# Patient Record
Sex: Male | Born: 1935 | Race: White | Hispanic: No | Marital: Married | Smoking: Never smoker
Health system: Southern US, Community
[De-identification: ages and names within clinical notes are randomized; demographics above are authoritative.]

## PROBLEM LIST (undated history)

## (undated) DIAGNOSIS — I272 Pulmonary hypertension, unspecified: Secondary | ICD-10-CM

## (undated) DIAGNOSIS — Z7901 Long term (current) use of anticoagulants: Secondary | ICD-10-CM

## (undated) DIAGNOSIS — Z950 Presence of cardiac pacemaker: Secondary | ICD-10-CM

## (undated) DIAGNOSIS — E785 Hyperlipidemia, unspecified: Secondary | ICD-10-CM

## (undated) DIAGNOSIS — I499 Cardiac arrhythmia, unspecified: Secondary | ICD-10-CM

## (undated) DIAGNOSIS — I340 Nonrheumatic mitral (valve) insufficiency: Secondary | ICD-10-CM

## (undated) DIAGNOSIS — I2510 Atherosclerotic heart disease of native coronary artery without angina pectoris: Secondary | ICD-10-CM

## (undated) DIAGNOSIS — I4891 Unspecified atrial fibrillation: Secondary | ICD-10-CM

## (undated) DIAGNOSIS — C61 Malignant neoplasm of prostate: Secondary | ICD-10-CM

## (undated) DIAGNOSIS — I10 Essential (primary) hypertension: Secondary | ICD-10-CM

## (undated) DIAGNOSIS — I509 Heart failure, unspecified: Secondary | ICD-10-CM

## (undated) HISTORY — PX: CHOLECYSTECTOMY: SHX55

## (undated) HISTORY — DX: Pulmonary hypertension, unspecified: I27.20

## (undated) HISTORY — DX: Long term (current) use of anticoagulants: Z79.01

## (undated) HISTORY — DX: Presence of cardiac pacemaker: Z95.0

---

## 1993-09-16 HISTORY — PX: CORONARY ANGIOPLASTY: SHX604

## 1999-05-03 ENCOUNTER — Emergency Department (HOSPITAL_COMMUNITY): Admission: EM | Admit: 1999-05-03 | Discharge: 1999-05-03 | Payer: Self-pay | Admitting: Emergency Medicine

## 1999-05-03 ENCOUNTER — Encounter: Payer: Self-pay | Admitting: Emergency Medicine

## 1999-05-09 ENCOUNTER — Encounter: Admission: RE | Admit: 1999-05-09 | Discharge: 1999-05-09 | Payer: Self-pay | Admitting: Urology

## 1999-05-09 ENCOUNTER — Encounter: Payer: Self-pay | Admitting: Urology

## 1999-05-24 ENCOUNTER — Inpatient Hospital Stay (HOSPITAL_COMMUNITY): Admission: AD | Admit: 1999-05-24 | Discharge: 1999-05-25 | Payer: Self-pay | Admitting: Cardiovascular Disease

## 1999-05-24 ENCOUNTER — Encounter: Payer: Self-pay | Admitting: Cardiovascular Disease

## 1999-05-24 ENCOUNTER — Encounter: Payer: Self-pay | Admitting: Urology

## 1999-05-25 ENCOUNTER — Encounter: Payer: Self-pay | Admitting: Urology

## 1999-05-31 ENCOUNTER — Encounter: Payer: Self-pay | Admitting: Urology

## 1999-05-31 ENCOUNTER — Ambulatory Visit (HOSPITAL_COMMUNITY): Admission: RE | Admit: 1999-05-31 | Discharge: 1999-05-31 | Payer: Self-pay | Admitting: Urology

## 2001-12-16 ENCOUNTER — Encounter: Payer: Self-pay | Admitting: Internal Medicine

## 2001-12-16 ENCOUNTER — Encounter: Admission: RE | Admit: 2001-12-16 | Discharge: 2001-12-16 | Payer: Self-pay | Admitting: Internal Medicine

## 2002-05-02 ENCOUNTER — Emergency Department (HOSPITAL_COMMUNITY): Admission: EM | Admit: 2002-05-02 | Discharge: 2002-05-02 | Payer: Self-pay | Admitting: Emergency Medicine

## 2002-05-10 ENCOUNTER — Encounter: Payer: Self-pay | Admitting: *Deleted

## 2002-05-10 ENCOUNTER — Encounter: Payer: Self-pay | Admitting: Cardiovascular Disease

## 2002-05-10 ENCOUNTER — Inpatient Hospital Stay (HOSPITAL_COMMUNITY): Admission: EM | Admit: 2002-05-10 | Discharge: 2002-05-12 | Payer: Self-pay | Admitting: Emergency Medicine

## 2002-05-11 HISTORY — PX: CARDIAC CATHETERIZATION: SHX172

## 2005-11-15 ENCOUNTER — Ambulatory Visit (HOSPITAL_BASED_OUTPATIENT_CLINIC_OR_DEPARTMENT_OTHER): Admission: RE | Admit: 2005-11-15 | Discharge: 2005-11-15 | Payer: Self-pay | Admitting: Surgery

## 2005-11-15 ENCOUNTER — Encounter (INDEPENDENT_AMBULATORY_CARE_PROVIDER_SITE_OTHER): Payer: Self-pay | Admitting: *Deleted

## 2006-10-07 ENCOUNTER — Encounter: Admission: RE | Admit: 2006-10-07 | Discharge: 2006-10-07 | Payer: Self-pay | Admitting: Cardiovascular Disease

## 2006-10-14 ENCOUNTER — Inpatient Hospital Stay (HOSPITAL_COMMUNITY): Admission: RE | Admit: 2006-10-14 | Discharge: 2006-10-17 | Payer: Self-pay | Admitting: Cardiovascular Disease

## 2006-10-14 HISTORY — PX: CARDIAC CATHETERIZATION: SHX172

## 2006-10-16 HISTORY — PX: PERMANENT PACEMAKER INSERTION: SHX6023

## 2009-05-24 ENCOUNTER — Ambulatory Visit: Payer: Self-pay | Admitting: Ophthalmology

## 2009-06-21 ENCOUNTER — Ambulatory Visit: Payer: Self-pay

## 2009-07-18 ENCOUNTER — Ambulatory Visit: Admission: RE | Admit: 2009-07-18 | Discharge: 2009-07-21 | Payer: Self-pay | Admitting: Radiation Oncology

## 2009-08-30 HISTORY — PX: NM MYOVIEW LTD: HXRAD82

## 2009-09-08 ENCOUNTER — Ambulatory Visit (HOSPITAL_BASED_OUTPATIENT_CLINIC_OR_DEPARTMENT_OTHER): Admission: RE | Admit: 2009-09-08 | Discharge: 2009-09-08 | Payer: Self-pay | Admitting: Urology

## 2010-07-08 ENCOUNTER — Encounter: Payer: Self-pay | Admitting: Urology

## 2010-08-10 ENCOUNTER — Ambulatory Visit (HOSPITAL_COMMUNITY)
Admission: RE | Admit: 2010-08-10 | Discharge: 2010-08-10 | Disposition: A | Discharge status: Home / Self Care | Payer: Medicare Other | Source: Ambulatory Visit | Attending: Cardiovascular Disease | Admitting: Cardiovascular Disease

## 2010-08-10 DIAGNOSIS — I059 Rheumatic mitral valve disease, unspecified: Secondary | ICD-10-CM | POA: Insufficient documentation

## 2010-08-13 ENCOUNTER — Ambulatory Visit (HOSPITAL_COMMUNITY): Admission: RE | Admit: 2010-08-13 | Payer: Medicare Other | Source: Ambulatory Visit | Admitting: Cardiovascular Disease

## 2010-09-10 LAB — POCT I-STAT, CHEM 8
Chloride: 107 mEq/L (ref 96–112)
Creatinine, Ser: 0.9 mg/dL (ref 0.4–1.5)
HCT: 42 % (ref 39.0–52.0)
Hemoglobin: 14.3 g/dL (ref 13.0–17.0)
Potassium: 4.5 mEq/L (ref 3.5–5.1)
Sodium: 142 mEq/L (ref 135–145)

## 2010-11-02 NOTE — Discharge Summary (Signed)
[name], [name] NO.:  [id number]   MEDICAL RECORD NO.:  [id number]          PATIENT TYPE:  INP   LOCATION:  2031                         FACILITY:  [facility]   PHYSICIAN:  Nicki Guadalajara, M.D.     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 Nicki Guadalajara who has known coronary artery disease.  He had a left  circumflex MI in 1995.  He underwent intervention.  Last cath was in  2003.  He was treated medically.  He was recently seen by Dr. Tresa Endo.  He  had atrial fibrillation with sick sinus syndrome.  He thought he needed  to undergo cardiac catheterization and then it was recommended that he  have permanent pacemaker.  Thus, he came in as an outpatient on October 14, 2006 and had a cardiac catheterization.  He had coronary artery  disease.  Please see Dr. Landry Dyke note for complete details.  His RCA  disease was slightly progressed.  The mid stenosis is now 90% and  eccentric.  Dr. Tresa Endo did not feel that this was the main reason for his  atrial fibrillation and his sick sinus syndrome.  He went on to have a  permanent pacemaker placed on Oct 16, 2006 by Dr. Lenise Herald.  He  had a Medtronic EnRhythm #[id number] placed.  The following day it was  interrogated and had normal device function.  He was seen by Dr. Jenne Campus  and Dr. Tresa Endo and he was considered stable for discharge home.  His  chest x-ray showed no pneumothorax.   LABORATORY DATA:  Hemoglobin 13.7, hematocrit 39.1, WBC 7.3, platelets  202,000.  Sodium 137, potassium 4, BUN 13, creatinine 0.92.  Chest x-ray  showed no acute findings.  He had no pneumothorax post pacemaker  placement.   DISCHARGE MEDICATIONS:  1. Toprol XL 50 mg a day.  2. Aspirin 81 mg a day.  3. Vytorin 10/20 at bedtime.  4. Ramipril 10 mg a day.  5. Norvasc 10  mg a day.  6. Coumadin.  He should start the day after discharge at the same dose      previous to hospitalization.  However, for 2 days, he is just to      take 2.5.   DISCHARGE INSTRUCTIONS:  He is to follow up with Dr. Tresa Endo on Oct 24, 2006.  He should keep his wound dry for 5 days and he may wash with soap  and water.  He was given written pacemaker activity instructions.   DISCHARGE DIAGNOSES:  1. Paroxysmal atrial fibrillation with sick sinus syndrome.  2. Known coronary artery disease with slightly progressed right      coronary artery disease of 90% with a history of myocardial      infarction and intervention of his circumflex in 1995.  He also has      mild residual other disease.  3. Hypertension.  4. Hyperlipidemia.  5. Anticoagulation  for paroxysmal atrial fibrillation.      Lezlie Octave, N.P.    ______________________________  Nicki Guadalajara, M.D.    BB/MEDQ  D:  11/17/2006  T:  11/17/2006  Job:  [id number]

## 2010-11-02 NOTE — Discharge Summary (Signed)
NAME:  Norman Lee, Norman Lee NO.:  [id number]   MEDICAL RECORD NO.:  [id number]                   PATIENT TYPE:  INP   LOCATION:  2922                                 FACILITY:  [facility]   PHYSICIAN:  Nicki Guadalajara, M.D.                  DATE OF BIRTH:  07-16-1928   DATE OF ADMISSION:  05/10/2002  DATE OF DISCHARGE:  05/12/2002                                 DISCHARGE SUMMARY   DISCHARGE DIAGNOSES:  1. Vertigo of undetermined etiology, possibly secondary to inner ear versus     posterior circulation embolic event.  2. Sinusitis.  3. Paroxysmal atrial fibrillation this admission.  4. History of coronary disease, status post circumflex intervention in 4/95,     with redo intervention in 7/95, catheterization this admission revealing     no re-stenosis.  5. Hypertension, improved at discharge.  6. Treated hyperlipidemia.  7. Chronic back problems.   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  He was seen recently by Dr. Tresa Endo in  the office to be established with a cardiologist.  He had a previous  myocardial infarction treated with circumflex intervention in 4/95, with  redo intervention to the circumflex in 7/95, Dr. Tresa Endo performed those  interventions.  His cardiologist had previously been Dr. Daleen Squibb.  He had some  dizzy spells as an outpatient.  Dr. Tresa Endo had set him up for a Cardiolite  study and an echocardiogram and made some adjustments to his medications,  increasing his Altace to 10 mg q.d. and his Toprol to 50 mg.  On the morning  of admission, he presented to the emergency room with significant dizziness  and nausea.  He previously had some back pain which was similar to his pre-  MI symptoms.  In the emergency room he had transient paroxysmal atrial  fibrillation with controlled ventricular response.  He is admitted to CCU.  CT scan of the chest was obtained to rule out dissecting  aneurysm, and this  was negative for this.  There was a nonspecific 5 mm nodule in the right  middle lobe.  Followup CT scan is recommended for three months.  CT scan  also revealed an incidental finding of a 1.5 cm mass in the tail of the  pancreas, a MRI is recommended.  MRI of the brain showed no acute  abnormalities.  CK's were negative.  His troponin's were slightly positive  at 0.05.  Catheterization was done on 05/11/02, by Dr. Tresa Endo which revealed  a 50% mid right coronary artery lesion, 50% mid circumflex lesion, 50%  obtuse marginal lesion, and 70 to 80% small diagonal narrowing, otherwise,  no significant stenosis in the left anterior descending.  Renal arteries and  iliacs were normal.  Left ventricular function was normal.  Dr. Tresa Endo felt  it would be best to start Coumadin.  We feel  the patient can be discharged  on 05/12/02.  He will be set up for an outpatient MRI of the pancreas,  outpatient carotid Dopplers to rule out carotid hyposensitivity, and an  outpatient event monitor.   DISCHARGE MEDICATIONS:  1. Toprol XL 50 mg q.d., this was not increased to 75 mg q.d. because of     baseline bradycardia and right bundle branch block at discharge.  2. Biaxin 250 mg b.i.d. x9 days.  3. Aspirin 81 mg q.d.  4. Coumadin 5 mg q.d. or as directed.  5. Altace 10 mg q.d.  6. Zocor 20 mg q.d.  7. Antevert 25 mg q.6h. p.r.n.  8. Nasacort spray 2 puffs b.i.d.   LABORATORY DATA:  White blood cell count 8.3, hemoglobin 13.3, hematocrit  38.8, platelet count 201.  Sodium 142, potassium 3.3, BUN 22, creatinine  1.0.  Hemoglobin A1C is pending at the time of dictation.  His admission  glucose was 168, followup glucose was 101.  HDL is 54, LDL 80.  INR 0.9.  CK  76 and 3.6, troponin-I of 0.05 and 0.09.  TSH is within normal limits.  EKG  shows sinus rhythm, sinus bradycardia with a right bundle branch block.   DISPOSITION:  The patient is discharged in stable condition.  He will have a   chest CT scan in three months.  He will arrange his own followup for this.   FOLLOWUP:  1. He is scheduled to have a MRI of the pancreas on 05/26/02 at 9:45 at     [facility].  He will have an event monitor at the office on     05/19/02, at 11:30 a.m. and carotid Dopplers at 12:30 p.m.  2. He will follow up with Dr. Tresa Endo, this will be arranged.     Abelino Derrick, P.A.                      Nicki Guadalajara, M.D.    Lenard Lance  D:  05/12/2002  T:  05/13/2002  Job:  [id number]   cc:   Loma Sender  [address]  [place]  [place] [address]  Fax: [phone]

## 2010-11-02 NOTE — Op Note (Signed)
NAME:  Norman Lee, Norman Lee            ACCOUNT NO.:  [id number]   MEDICAL RECORD NO.:  [id number]          PATIENT TYPE:  AMB   LOCATION:  DSC                          FACILITY:  [facility]   PHYSICIAN:  Thornton Park. Daphine Deutscher, MD  DATE OF BIRTH:  08/03/1928   DATE OF PROCEDURE:  11/15/2005  DATE OF DISCHARGE:                                 OPERATIVE REPORT   PREOPERATIVE DIAGNOSIS:  Right inguinal hernia.   POSTOPERATIVE DIAGNOSIS:  Right indirect inguinal hernia.   PROCEDURE:  Right inguinal herniorrhaphy with mesh.   SURGEON:  Thornton Park. Daphine Deutscher, MD.   ANESTHESIA:  General by MAC.   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  We were able to get this reduced  and he decided to go ahead and get this repaired.  He was taken to room 4 at  [facility] on November 15, 2005 and given general by LMA.  The  inguinal region was shaved and prepped with Hibiclens and draped sterilely.  A small oblique incision was made and carried down to the fat to the  external oblique.  This was notable, in that the external oblique fibers  were splayed, as sometimes seen in a sports hernia.  I went ahead and opened  along that line, mobilized the cord and got around with a Penrose drain.  I  went proximally and incised the cremasteric fibers longitudinally, found a  sac which was actually going down into the scrotum and put my finger into  that.  I dissected it free from the cord structures, and then was able to  carry it up and perform a high ligation; twisting it off near the internal  ring, and then __________ out with a 3-0 silk figure-of-eight suture.  After  completing this, I repaired the floor with a piece of Marlex mesh cut to  fit, sutured along the inguinal ligament with running 2-0 Prolene.  Then  medial with running 2-0 Prolene and overlapping with a  horizontal mattress  suture of 2-0 Prolene.  There was plenty of room for the cord structures,  but it was snug enough to be prevent recurrent indirect hernias.  The  external oblique was then closed with a running 2-0 Vicryl.  The area was  injected with Marcaine and closed with 4-0 Vicryl, then  with a running subcuticular 5-0 Vicryl, with Benzoin Steri-Strips on the  skin.  The patient seemed to tolerate the procedure well.  He will be given  some Darvocet N 100 to take for pain.  He will be followed up in the office  in a few weeks.      Thornton Park Daphine Deutscher, MD  Electronically Signed     MBM/MEDQ  D:  11/15/2005  T:  11/15/2005  Job:  [id number]   cc:   Meliton Rattan, M.D.  [place], [facility]   Harless Nakayama, M.D.

## 2010-11-02 NOTE — Cardiovascular Report (Signed)
NAME:  Norman Lee, HOLT NO.:  [id number]   MEDICAL RECORD NO.:  [id number]          PATIENT TYPE:  OIB   LOCATION:  2807                         FACILITY:  [facility]   PHYSICIAN:  Nicki Guadalajara, M.D.     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  His last catheterization was in November 2003 which  showed patency at the initial intervention site and no restenosis at the  bifurcation of a large OM-1 vessel which was done at his second  intervention in July 1999.  At that time, he did have narrowing of 30-  40% in the circumflex ostium and marginal 50% narrowing in the proximal  portion of the marginal vessel, 50% percent narrowing in the AV groove  circumflex, 30-40% narrowing in the OM-2 and 50% narrowing in the right  coronary artery.  He has a history of paroxysmal atrial fibrillation.  He also has probable sick sinus syndrome and recently has had problems  with recurrent atrial fibrillation as well as development of AV  dissociation, complete heart block.  His beta blockers have been  withheld.  He had then developed recurrent atrial fibrillation with  ventricular rates in the 40's.  He also has noticed some vague episodes  of chest pain and with discontinuance of his Toprol, his blood pressure  has become more elevated for which Norvasc was started.  His Coumadin  dose was held as of last Friday.  Yesterday, he received Lovenox 80 mg  x2 12 hours apart as an outpatient and today presents for  catheterization prior to probable need for permanent pacemaker  insertion.  INR today was 1.2 with his last dose of Lovenox at home  being last night.   PROCEDURE:  Upon  arrival to the catheterization laboratory, the  patient's heart rhythm was variable.  There were periods where he  appeared to be in sinus rhythm in the upper 50's; at times there also  was a complete heart block with AV dissociation in the low 40's.  There  also was 2:1 heart block.  He received Valium 5 mg intravenously and an  additional 25 mg of fentanyl for conscious sedation.  Right femoral  artery was punctured anteriorly and a 5-French sheath was inserted.  Diagnostic catheterization was done utilizing 5-French Judkins, 4-left  and right coronary catheters.  A pigtail catheter was used for biplane  cine left ventriculography as well as distal aortography.  Hemostasis  was obtained by direct manual pressure.  The patient tolerated the  procedure well.   HEMODYNAMIC DATA:  Central aortic pressure was 174/65.  Left ventricular  pressure was 174/5, post A-wave 24.   ANGIOGRAPHIC DATA:  Fluoroscopy revealed significant calcification of  the mitral annulus.   The left main coronary artery essentially was a common ostium that  immediately bifurcated into the LAD and left  circumflex system.   The LAD had mild luminal irregularity of 30% between first and second  diagonal vessel.  The first diagonal vessel was a very small caliber  vessel with actual improvement in the previously noted 90% stenosis from  prior catheterizations.  There was mid 30% LAD stenoses after the  takeoff of a moderate size second diagonal vessel.   The circumflex vessel was a large dominant vessel.  There was  improvement in the previously noted ostial narrowing.  There was mild  20% area of irregularity at the site of initial total occlusion proximal  to a large OM vessel.  There was 60% narrowing in a diminutive first  marginal vessel, not significant.  The first large marginal vessel had  30% and 30-40% narrowings in the proximal to mid segment at site of  prior intervention.  There was previously noted  50-60% narrowing in the  mid distal AV groove circumflex not significantly changed from prior  angiogram.   The right coronary artery was a moderate size vessel that ended in the  posterior descending artery.  There did appear to be slight progression  of narrowing focally in the mid RCA which now appeared to be 70%  eccentrically on some views in comparison to the previous narrowing of  50%.   Biplane cine left ventriculography revealed an ejection fraction in the  40--50% range.  On the RAO projection, there was a small region of basal  posterior aneurysmal dilatation with hypocontractility in the mid  anterolateral wall and posterior wall.  On the LAO projection, there was  mild hypocontractility noted in the low to mid posterolateral wall with  suggestion of mild aneurysmal dilatation focally in the upper  posterolateral wall.   Distal aortography did not demonstrate any renal artery stenosis.  There  was no significant aortoiliac disease.   IMPRESSION:  1. Mild left ventricular dysfunction with an ejection fraction of 40-      50% with focal region of mid posterolateral hypocontractility, as      well as suggestion of a small left ventricular aneurysmal      dilatation of the posterobasal and upper posterolateral wall.  2. Multivessel coronary artery disease with improvement in prior left      anterior descending narrowings with only residual 30% narrowing in      the mid left anterior descending no restenosis at the site of acute      left circumflex infarction with residual 20% narrowing and      segmental 30 and 40% narrowing in the large obtuse marginal-1      vessel with 50-60% narrowing in the mid AV groove circumflex      vessel; and slightly progressive stenoses now being 70%      eccentrically in the mid right coronary artery.   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  He has developed  probable sick sinus syndrome with brady tachy variant with paroxysmal  atrial fibrillation, transient complete heart block, 2:1 block.  His RCA  is slightly progressed with reference to its mid stenosis now being 70%  eccentrically.  However, I do not feel this is the main reason for his  arrhythmia.  Consequently, plans will be for him to undergo  permanent  pacemaker insertion on Thursday, Oct 16, 2006, by Dr. Jenne Campus.  Percutaneous coronary intervention may ultimately be done to the right  coronary artery, but this will be deferred until he is stable and on  increased medical therapy following his pacemaker insertion so as not to  complicate potential bleeding risk with Plavix prior to pacemaker  implantation.           ______________________________  Nicki Guadalajara, M.D.     TK/MEDQ  D:  10/14/2006  T:  10/14/2006  Job:  [id number]

## 2010-11-02 NOTE — Cardiovascular Report (Signed)
NAME:  Norman Lee, Norman Lee NO.:  [id number]   MEDICAL RECORD NO.:  [id number]                   PATIENT TYPE:  INP   LOCATION:  2922                                 FACILITY:  [facility]   PHYSICIAN:  Nicki Guadalajara, M.D.                  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  He ultimately was transported to [place].  Acute  catheterization revealed total occlusion of a large left circumflex coronary  artery causing his acute left circumflex MI. He also had mild additional  coronary artery disease with 90% stenosis in a small first diagonal vessel  75% and 40% stenosis in a midportion of an anterior RV marginal branch and  also midportion of a PDA. He underwent successful direct angioplasty of his  circumflex.  Reasonable fusion time was approximately 8 hours and 25  minutes.  He developed re-stenosis in July 1995 and was found to have 99%  and 95% tandem re-stenosis of his circumflex at the bifurcation and large  obtuse marginal vessel.  There was no change in the prior concomitant CAD,  and he underwent successful PTCA of the circumflex at which time he was  enrolled in the EPILOG trial.  Subsequently he has done well.  Recently, the  patient has noticed several episodes of back discomfort which seem to make  his previous pain.  He presented to the emergency room yesterday with acute  vertigo and was found to have new onset atrial fibrillation. He had recently  been seen in the office and beta blocker therapy had been increased.  Head  CT and ultimate MRI did not show any acute CVA. Troponin had increased to  0.9.  With the patient's back discomfort, mild troponin increase and  possible need for  anticoagulation in light of his paroxysmal atrial  fibrillation, it was felt that definitive catheterization was necessary to  make certain it there was none in ischemia mediated, etiology to his  symptoms.   DESCRIPTION OF PROCEDURE:  After premedication with Versed 2 mg  intravenously, the patient was prepped and draped in the usual fashion.  His  right coronary artery was punctured anteriorly and a 6 French sheath was  inserted.  Diagnostic catheterization was done utilizing JL4 and JL5 left  coronary catheters.  Essentially there was almost a separate ostium and  consequently the left 4 catheter was used for the LAD and the JL5 for the  circumflex engagement.  A JR4 was used for the right coronary artery.  A  pigtail catheter was inserted and biplane cine left ventriculography as well  as distal aortography was performed.  Hemostasis was obtained by direct  manual pressure.  HEMODYNAMIC DATA:  Central aortic pressure is 135/74, mean 100.  Left  ventricular pressure is 135/6, post A wave of 17.   ANGIOGRAPHIC DATA:  Centrally is a common ostium to the LAD and circumflex  with no significant left main visualized.   The LAD gave rise to a small first diagonal vessel.  This vessel had  previously been shown to have 90% stenosis.  The stenosis now was somewhat  improved in the 70-80% range and was fairly smooth. There was mild luminal  irregularity of the mid LAD of 20-30%, proximal to the second septal  perforating artery and 20% in the mid circumflex after the second and third  diagonal.   The circumflex vessel again had almost separate ostium. This was a very  large vessel. There was no significant re-stenosis at the site of prior  acute infarction and subsequent re-stenosis in 1995, which had been at the  bifurcation proximal and at the bifurcation of this large marginal vessel.  There was residual narrowing of approximately 20%.  There was 40% narrowing  in the ostium of the  circumflex followed by a proximal 50% narrowing.  The  AV groove circumflex had 50% narrowing.  The distal marginal vessel had  scattered 30-40% disease and there was 30-40% disease in the distal AV  groove circumflex.   The right coronary artery was a codominant vessel.  There was smooth 50% mid  narrowing.   Biplane cine left ventriculography revealed overall preserved global  function.  Ejection fraction is being calculated.  There was a focal mid  hypocontractility in the inferior wall which was significantly improved from  his acute catheterization.  There was a small focal area of mid  posterolateral hypo to akinesis.   DISTAL AORTOGRAPHY:  Distal aortography did not demonstrate any significant  renal artery stenosis.  There was no significant aortoiliac disease.   IMPRESSION:  1. Preserved global left ventricular function with focal mid posterolateral     significant hypocontractility from the patient's prior left circumflex     myocardial infarction.  2. A 70-80% stenosis in the small first diagonal vessel of the left anterior     descending (actually improved from 1995) with luminal irregularity of the     left anterior descending of 20-30% proximally in the mid segment.  3. No significant re-stenosis at the site of prior acute circumflex     infarction and subsequent re-stenosis involving the proximal large     circumflex and bifurcation of a large obtuse marginal vessel with a     residual narrowing of 20-30% with 40% narrowing at the ostium of the     circumflex marginal and 50% narrowing in the proximal portion of the     marginal branch.  There is 50% narrowing in the arteriovenous groove     circumflex, 30-40% obtuse marginal #2 stenosis.  4. A 50% right coronary artery stenosis.   RECOMMENDATIONS:  Cineangiograms will be reviewed with colleagues.  The  present anatomy does not show significant high-grade stenosis.  Increased medical therapy will be initiated.   Subsequently, Cardiolite imaging my be  helpful to assess potential ischemia.  Consideration will be given for  Coumadin anticoagulation versus aspirin/Plavix in light of the patient's  recent vertiginous symptomatology.  Nicki Guadalajara, M.D.    TK/MEDQ  D:  05/11/2002  T:  05/11/2002  Job:  [id number]

## 2010-11-02 NOTE — H&P (Signed)
NAME:  Norman Lee, Norman Lee NO.:  [id number]   MEDICAL RECORD NO.:  [id number]                   PATIENT TYPE:  INP   LOCATION:  1823                                 FACILITY:  [facility]   PHYSICIAN:  Dani Gobble, M.D.                  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 Nicki Guadalajara to be established with a cardiologist, May 05, 2002.  He has had  previous circumflex MI treated with circumflex intervention, September 16, 1993,  and he had redo intervention with circumflex PTCA, January 11, 1994.  He had  followed up with [facility] after this; Dr. Tresa Endo had done his  procedures.  He reportedly did have a negative Cardiolite study in 1999.  A  week ago, he presented to the emergency room with dizziness and back pain  and palpitations.  Apparently, he sat in the ER at [facility] for three hours and  was never seen.  At that point, he was feeling better and decided to go  home.  He called Dr. Tresa Endo, May 05, 2002, and Dr. Tresa Endo saw him in the  office.  Dr. Tresa Endo made some adjustments in his medicines; he increased his  atenolol to Toprol 50 mg every day and increased his Altace from 5 mg a day  to 10 mg.  He was sent for an echocardiogram and rest stress Cardiolite.  This morning, May 10, 2002, he presented to the emergency room with  significant dizziness with nausea.  He has no chest pain and denies back  pain this morning.  He denies any shortness of breath or diaphoresis.  When  he had his MI in 1995, he had also complaints of back pain at that time.  Currently, his back pain has been low back pain.  In the emergency room, his  blood pressure initially was 170/100.  His EKG showed atrial fibrillation  with a  controlled ventricular response.  EKG from Dr. Landry Dyke office visit,  May 05, 2002, showed sinus rhythm with right bundle branch block.  He  converted spontaneously in the emergency room to a sinus rhythm.  He remains  nauseated and continues to complain of dizziness and says that the room is  spinning to the left.  He did receive Valium in the emergency room per the  ER physician with no change in his symptoms.   PAST MEDICAL HISTORY:  His past medical history is remarkable for  hypertension and hyperlipidemia.  He had lithotripsy in 2000 and  cholecystectomy in 1990.   CURRENT MEDICATIONS:  1. Altace 10 mg a day.  2. Toprol-XL 50 mg a day.  3. Zocor 20 mg a day.  4. Aspirin every day.   ALLERGIES:  He has no known drug allergies but is intolerant to CODEINE.   SOCIAL HISTORY:  He is married, for 27 years.  He has two children and four  grandchildren.  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  There is no  history of smoking.   FAMILY HISTORY:  Family history is remarkable that his mother is still alive  in her 65s.  Father died at 52 of unclear causes.  One sister died in her  65s of diabetes; she died of an MI.   REVIEW OF SYSTEMS:  Review of systems is negative for recent visual  disturbances.  He has not had excessive fatigue.  He has had some  palpitations and fluttering.  He has a synovial cyst protruding at L4-L5  which gives him some back pain; his activity has been reduced because of  this.  He has not had GI bleeding or peptic ulcer disease.  Review of  systems is otherwise unremarkable except for noted above.   PHYSICAL EXAMINATION:  VITAL SIGNS:  Pulse 58, respirations 16, 152/88.  GENERAL:  In general, he is a well-developed, well-nourished, elderly male,  pale, but in no acute distress.  HEENT:  Normocephalic.  Extraocular movements are intact.  Sclerae are  nonicteric.  NECK:  Neck is without JVD and without bruit.  CHEST:  Chest is clear to  auscultation and percussion.  CARDIAC:  Exam reveals regular rate and rhythm without obvious murmur, rub  or gallop, normal S1 and S2.  ABDOMEN:  Abdomen is nontender.  No hepatosplenomegaly.  EXTREMITIES:  Extremities are without edema.  Distal pulses are 2+/4,  bilaterally.  NEUROLOGIC:  Exam is grossly intact.  He is continuing to complain of  dizziness and nausea.   LABORATORY AND ACCESSORY CLINICAL DATA:  EKG reveals sinus rhythm with right  bundle branch block and nonspecific ST changes.   IMPRESSION:  1. Back pain and dizziness, rule out myocardial infarction, rule out aortic     dissection, rule out cerebrovascular accident.  2. Known coronary disease with circumflex intervention, April of 1995, redo     circumflex intervention, July of 1995.  3. Hypertension, suboptimal control.  4. Treated hyperlipidemia.   PLAN:  The patient was seen by Dr. Dani Gobble and Dr. Nanetta Batty and  myself today in the emergency room.  He will be set up for a CT of his chest  to rule out aortic dissection pending his labs.  He will also get an MRI to  rule out CVA.  If the above are negative, he will be put on IV heparin.  We  did add nitroglycerin to try and get his pressure to a goal of 150 and no  less at this time.  He will most likely need recatheterization, which will  be set up for tomorrow.     Abelino Derrick, P.A.                      Dani Gobble, M.D.    Lenard Lance  D:  05/10/2002  T:  05/10/2002  Job:  [id number]

## 2010-11-02 NOTE — Op Note (Signed)
NAMERYMAN, RATHGEBER NO.:  [id number]   MEDICAL RECORD NO.:  [id number]          PATIENT TYPE:  INP   LOCATION:  2031                         FACILITY:  [facility]   PHYSICIAN:  Darlin Priestly, MD  DATE OF BIRTH:  [birth year]   DATE OF PROCEDURE:  10/16/2006  DATE OF DISCHARGE:                               OPERATIVE REPORT   PROCEDURE:  Insertion of a Medtronic Enrhythm P1501DR generator, serial  [id number], with active atrial and ventricular leads.   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 Daphene Jaeger  with a history of known CAD status post left circumflex PCI in 1995 with  history of atrial fibrillation now with intermittent complete AV  dissociation and sick sinus syndrome.  He underwent repeat  catheterization by Dr. Daphene Jaeger on October 13, 2006, revealing a 70% mid  RCA lesion, but no further significant disease.  He is now brought for  dual chamber pacer implant for control of his arrhythmia and to allow  adequate therapy of his CAD.   DESCRIPTION OF PROCEDURE:  After informed consent, the patient was  brought to the cardiac cath lab where the left chest was shaved, prepped  and draped in a sterile fashion.  Monitoring was established.  1%  lidocaine was then used to anesthetize the left mid subclavicular area.  Next, an approximately 3 cm transverse incision was carried out beneath  the mid left clavicle and hemostasis obtained with cautery.  Blunt  dissection was used to carry this down to the left pectoral fascia and  an approximately 3-4 cm pocket was created in the left pectoral fascia  and, again, hemostasis was obtained with cautery.   Next, the left subclavian vein was then entered using fluoro and  contrast guidance.  We easily passed two retained guide-wires.  A 4-0  silk suture was then placed at the base of the retained guide-wires.  Over the first retained guide-wire, a 7 French dilator and sheath were  then easily inserted  and the dilator and guide-wire were removed.  Through this, a 58 cm active Medtronic lead, model number [id number], serial  [id number], was then easily passed into the right atrium.  The peel away  sheath was removed.  Over the second retained guide-wire, a second 7  [place] dilator and sheath were then easily passed and the dilator and  guidewire were then removed.  Through this, a 52 cm active Medtronic  lead, model number [id number], serial [id number], was then passed into the  right atrium and, again, the peel away sheath was removed.  A J curve  was then placed in the ventricular lead stylet and the ventricular lead  was brought up to the tricuspid valve.  The ventricular lead was then  positioned in the RV apex.  We were able to capture at 2 volts with good  R-waves.  The screw was then extended and thresholds were determined.  R-  waves measured 8.9 mV, impedance 779 ohms, threshold was 0.4 volts at  0.5 milliseconds, current was 6 mA.  10 volts was negative for  diaphragmatic stimulation.  A  preformed J stylet was then placed in the  atrial lead and the atrial lead was then positioned in the right  appendage.  This appeared to have excellent wag and we were getting fib  waves up to 3.4 to 4 mV.  The screw was then extended.  Again, the A fib  waves were 3.4 to 4 mV.  The impedance was 1001 ohms.  There was  excellent current of injury in both the atrial and ventricular lead.  The leads were then sutured into place with two silk sutures per lead.   The pocket was then copiously irrigated with 1% Kanamycin solution.  Again, hemostasis was confirmed.  The leads were then connected in a  serial fashion to a Medtronic Enrhythm P1501DR generator, serial  [id number].  The head screws were tightened and pacing was confirmed.  A  single silk suture was then placed in the apex of the pocket.  The  generator and leads were then delivered into the pocket and the header  was secured with a silk suture.   The subcutaneous layer was then closed  using running 2-0 Vicryl.  The skin was then closed using running 4-0  Vicryl.  Steri-Strips were applied.  The patient was transferred to the  recovery room in stable condition.   CONCLUSIONS:  Successful implant of a Medtronic Enrhythm, [id number]  generator serial [id number], with active atrial and ventricular leads.      Darlin Priestly, MD  Electronically Signed     RHM/MEDQ  D:  10/16/2006  T:  10/16/2006  Job:  [id number]   cc:   Nicki Guadalajara, M.D.

## 2012-03-18 ENCOUNTER — Other Ambulatory Visit (HOSPITAL_COMMUNITY): Payer: Self-pay | Admitting: Urology

## 2012-03-18 DIAGNOSIS — C61 Malignant neoplasm of prostate: Secondary | ICD-10-CM

## 2012-03-27 HISTORY — PX: US ECHOCARDIOGRAPHY: HXRAD669

## 2012-04-22 ENCOUNTER — Encounter (HOSPITAL_COMMUNITY): Payer: Self-pay

## 2012-04-22 ENCOUNTER — Encounter (HOSPITAL_COMMUNITY)
Admission: RE | Admit: 2012-04-22 | Discharge: 2012-04-22 | Disposition: A | Discharge status: Home / Self Care | Payer: Medicare Other | Source: Ambulatory Visit | Attending: Urology | Admitting: Urology

## 2012-04-22 DIAGNOSIS — C61 Malignant neoplasm of prostate: Secondary | ICD-10-CM | POA: Insufficient documentation

## 2012-04-22 HISTORY — DX: Malignant neoplasm of prostate: C61

## 2012-04-22 MED ORDER — TECHNETIUM TC 99M MEDRONATE IV KIT
23.6000 | PACK | Freq: Once | INTRAVENOUS | Status: AC | PRN
  Administered 2012-04-22: 23.6 via INTRAVENOUS

## 2012-07-22 ENCOUNTER — Other Ambulatory Visit (HOSPITAL_COMMUNITY): Payer: Self-pay | Admitting: Oncology

## 2012-07-28 ENCOUNTER — Other Ambulatory Visit: Payer: Self-pay | Admitting: Cardiovascular Disease

## 2012-08-07 ENCOUNTER — Ambulatory Visit (HOSPITAL_COMMUNITY): Admission: RE | Admit: 2012-08-07 | Payer: Medicare Other | Source: Ambulatory Visit | Admitting: Cardiovascular Disease

## 2012-08-07 ENCOUNTER — Encounter (HOSPITAL_COMMUNITY): Admission: RE | Payer: Self-pay | Source: Ambulatory Visit

## 2012-08-07 SURGERY — LEFT AND RIGHT HEART CATHETERIZATION WITH CORONARY ANGIOGRAM
Anesthesia: LOCAL

## 2012-08-31 ENCOUNTER — Other Ambulatory Visit: Payer: Self-pay | Admitting: *Deleted

## 2012-08-31 DIAGNOSIS — I495 Sick sinus syndrome: Secondary | ICD-10-CM

## 2012-09-03 MED ORDER — SODIUM CHLORIDE 0.9 % IR SOLN
80.0000 mg | Status: DC
  Filled 2012-09-03: qty 2

## 2012-09-03 MED ORDER — CEFAZOLIN SODIUM-DEXTROSE 2-3 GM-% IV SOLR
2.0000 g | INTRAVENOUS | Status: DC
  Filled 2012-09-03 (×2): qty 50

## 2012-09-03 MED ORDER — SODIUM CHLORIDE 0.45 % IV SOLN
INTRAVENOUS | Status: DC
  Administered 2012-09-04: 08:00:00 via INTRAVENOUS

## 2012-09-03 MED ORDER — SODIUM CHLORIDE 0.9 % IJ SOLN: 3.0000 mL | INTRAMUSCULAR | Status: DC | PRN

## 2012-09-04 ENCOUNTER — Encounter (HOSPITAL_COMMUNITY)
Admission: RE | Disposition: A | Discharge status: Home / Self Care | Payer: Self-pay | Source: Ambulatory Visit | Attending: Cardiovascular Disease

## 2012-09-04 ENCOUNTER — Ambulatory Visit (HOSPITAL_COMMUNITY)
Admission: RE | Admit: 2012-09-04 | Discharge: 2012-09-04 | Disposition: A | Discharge status: Home / Self Care | Payer: Medicare Other | Source: Ambulatory Visit | Attending: Cardiovascular Disease | Admitting: Cardiovascular Disease

## 2012-09-04 ENCOUNTER — Ambulatory Visit (HOSPITAL_COMMUNITY): Payer: Medicare Other

## 2012-09-04 ENCOUNTER — Encounter (HOSPITAL_COMMUNITY): Payer: Self-pay | Admitting: Physician Assistant

## 2012-09-04 DIAGNOSIS — I252 Old myocardial infarction: Secondary | ICD-10-CM | POA: Insufficient documentation

## 2012-09-04 DIAGNOSIS — Z79899 Other long term (current) drug therapy: Secondary | ICD-10-CM | POA: Insufficient documentation

## 2012-09-04 DIAGNOSIS — C61 Malignant neoplasm of prostate: Secondary | ICD-10-CM | POA: Insufficient documentation

## 2012-09-04 DIAGNOSIS — Z950 Presence of cardiac pacemaker: Secondary | ICD-10-CM

## 2012-09-04 DIAGNOSIS — I2510 Atherosclerotic heart disease of native coronary artery without angina pectoris: Secondary | ICD-10-CM | POA: Diagnosis present

## 2012-09-04 DIAGNOSIS — E785 Hyperlipidemia, unspecified: Secondary | ICD-10-CM | POA: Diagnosis present

## 2012-09-04 DIAGNOSIS — Z885 Allergy status to narcotic agent status: Secondary | ICD-10-CM | POA: Insufficient documentation

## 2012-09-04 DIAGNOSIS — Z7982 Long term (current) use of aspirin: Secondary | ICD-10-CM | POA: Insufficient documentation

## 2012-09-04 DIAGNOSIS — I4891 Unspecified atrial fibrillation: Secondary | ICD-10-CM | POA: Insufficient documentation

## 2012-09-04 DIAGNOSIS — I059 Rheumatic mitral valve disease, unspecified: Secondary | ICD-10-CM | POA: Insufficient documentation

## 2012-09-04 DIAGNOSIS — Z7901 Long term (current) use of anticoagulants: Secondary | ICD-10-CM | POA: Insufficient documentation

## 2012-09-04 DIAGNOSIS — I272 Pulmonary hypertension, unspecified: Secondary | ICD-10-CM | POA: Diagnosis present

## 2012-09-04 DIAGNOSIS — I10 Essential (primary) hypertension: Secondary | ICD-10-CM | POA: Insufficient documentation

## 2012-09-04 DIAGNOSIS — I340 Nonrheumatic mitral (valve) insufficiency: Secondary | ICD-10-CM | POA: Diagnosis present

## 2012-09-04 DIAGNOSIS — I495 Sick sinus syndrome: Secondary | ICD-10-CM

## 2012-09-04 DIAGNOSIS — Z45018 Encounter for adjustment and management of other part of cardiac pacemaker: Secondary | ICD-10-CM | POA: Insufficient documentation

## 2012-09-04 HISTORY — DX: Atherosclerotic heart disease of native coronary artery without angina pectoris: I25.10

## 2012-09-04 HISTORY — DX: Hyperlipidemia, unspecified: E78.5

## 2012-09-04 HISTORY — DX: Essential (primary) hypertension: I10

## 2012-09-04 HISTORY — DX: Unspecified atrial fibrillation: I48.91

## 2012-09-04 HISTORY — PX: PERMANENT PACEMAKER GENERATOR CHANGE: SHX6022

## 2012-09-04 HISTORY — DX: Nonrheumatic mitral (valve) insufficiency: I34.0

## 2012-09-04 LAB — BASIC METABOLIC PANEL
BUN: 25 mg/dL — ABNORMAL HIGH (ref 6–23)
CO2: 27 mEq/L (ref 19–32)
Glucose, Bld: 87 mg/dL (ref 70–99)
Potassium: 3.7 mEq/L (ref 3.5–5.1)
Sodium: 142 mEq/L (ref 135–145)

## 2012-09-04 LAB — CBC
Hemoglobin: 15.7 g/dL (ref 13.0–17.0)
MCH: 31.9 pg (ref 26.0–34.0)
MCHC: 36.1 g/dL — ABNORMAL HIGH (ref 30.0–36.0)
MCV: 88.4 fL (ref 78.0–100.0)
RBC: 4.92 MIL/uL (ref 4.22–5.81)

## 2012-09-04 SURGERY — PERMANENT PACEMAKER GENERATOR CHANGE
Anesthesia: LOCAL

## 2012-09-04 MED ORDER — LIDOCAINE HCL (PF) 1 % IJ SOLN
INTRAMUSCULAR | Status: AC
  Filled 2012-09-04: qty 60

## 2012-09-04 MED ORDER — MUPIROCIN 2 % EX OINT
TOPICAL_OINTMENT | Freq: Two times a day (BID) | CUTANEOUS | Status: DC
  Administered 2012-09-04: 1 via NASAL

## 2012-09-04 MED ORDER — ONDANSETRON HCL 4 MG/2ML IJ SOLN: 4.0000 mg | Freq: Four times a day (QID) | INTRAMUSCULAR | Status: DC | PRN

## 2012-09-04 MED ORDER — SODIUM CHLORIDE 0.9 % IV SOLN: INTRAVENOUS | Status: DC

## 2012-09-04 MED ORDER — MUPIROCIN 2 % EX OINT
TOPICAL_OINTMENT | CUTANEOUS | Status: AC
  Filled 2012-09-04: qty 22

## 2012-09-04 MED ORDER — FENTANYL CITRATE 0.05 MG/ML IJ SOLN
INTRAMUSCULAR | Status: AC
  Filled 2012-09-04: qty 2

## 2012-09-04 MED ORDER — MIDAZOLAM HCL 5 MG/5ML IJ SOLN
INTRAMUSCULAR | Status: AC
  Filled 2012-09-04: qty 5

## 2012-09-04 MED ORDER — ACETAMINOPHEN 325 MG PO TABS: 325.0000 mg | ORAL_TABLET | ORAL | Status: DC | PRN

## 2012-09-04 NOTE — CV Procedure (Signed)
Norman Lee, Imperato Male, 79 y.o., [birth year]  MRN: [id number]  CSN: [id number]  Admit Dt: 09/04/12  Procedure report  Procedure performed:  1. Dual chamber pacemaker generator changeout  2. Light sedation  Reason for procedure:  1. Device generator at elective replacement interval  Procedure performed by:  Thurmon Fair, MD  Complications:  None  Estimated blood loss:  <5 mL  Medications administered during procedure:  Ancef 2 g intravenously, lidocaine 1% 30 mL locally, fentanyl 25 mcg intravenously, Versed 1 mg intravenously Device details:   New Generator Medtronic Adapta  L model number ADDRL1, serial number [id number] Right atrial lead (chronic) Medtronic, model number [id number], serial [id number] (implanted 10/16/2006) Right ventricular lead (chronic)  Medtronic, model number [id number], serial number [id number] (implanted 10/16/2006)  Explanted generator Medtronic EnRhythm,  model number P1501DR, serial number  [id number] (implanted 10/16/2006)  Procedure details:  After the risks and benefits of the procedure were discussed the patient provided informed consent. She was brought to the cardiac catheter lab in the fasting state. The patient was prepped and draped in usual sterile fashion. Local anesthesia with 1% lidocaine was administered to to the left infraclavicular area. A 5-6cm horizontal incision was made parallel with and 2-3 cm caudal to the left clavicle, in the area of an old scar. Using minimal electrocautery and mostly sharp and blunt dissection the prepectoral pocket was opened carefully to avoid injury to the loops of chronic leads. Extensive dissection was not necessary. The device was explanted. The pocket was carefully inspected for hemostasis and flushed with copious amounts of antibiotic solution.  The leads were disconnected from the old generator and testing of the lead parameters later showed excellent values. The new generator was connected to the chronic  leads, with appropriate pacing noted.   The entire system was then carefully inserted in the pocket with care been taking that the leads and device assumed a comfortable position without pressure on the incision. Great care was taken that the leads be located deep to the generator. The pocket was then closed in layers using 2 layers of 2-0 Vicryl and cutaneous staples after which a sterile dressing was applied.   At the end of the procedure the following lead parameters were encountered:   Right atrial lead sensed P waves 0.5 mV (AFib), impedance 476 ohms, threshold  Not tested.  Right ventricular lead sensed R waves  None detected mV, impedance 450 ohms, threshold 0.5 at 0.4 ms pulse width.   Thurmon Fair, MD, [facility] [phone] office [phone] pager 09/04/2012

## 2012-09-04 NOTE — H&P (Signed)
Norman Lee is an 79 y.o. male.   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  His last cardiac cath was in 2008 and revealed progressive, mid RCA stenosis, patent circumflex sites but with residual 20-30% proximal and  50-60% in the AV groove.  The LAD had 30% stenosis.  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  The patient has elected not to have MVR.   His last 2D echo was Oct 2013 and revealed RV systolic pressure of .  Apparently he was supposed to have a LHC last month.  His only complaint at this time is sinus drainage and SOB.  He denies N, V, fever, CP, orthopnea, LEE, abd pain, hematochezia, hematuria.  Past Medical History  Diagnosis Date  . Prostate cancer   . CAD (coronary artery disease)     Circumflex MI 1995, Last cath 2008  . Mitral regurgitation     Severe  . HLD (hyperlipidemia)   . HTN (hypertension)   . Atrial fibrillation     No past surgical history on file.  No family history on file. Social History:  has no tobacco, alcohol, and drug history on file.  Allergies:  Allergies  Allergen Reactions  . Codeine Nausea And Vomiting    Medications Prior to Admission  Medication Sig Dispense Refill  . aspirin 81 MG tablet Take 81 mg by mouth daily.      . Multiple Vitamin (MULTIVITAMIN WITH MINERALS) TABS Take 1 tablet by mouth daily.      . nebivolol (BYSTOLIC) 5 MG tablet Take 5 mg by mouth daily.      . ramipril (ALTACE) 5 MG capsule Take 5 mg by mouth daily.      . simvastatin (ZOCOR) 10 MG tablet Take 10 mg by mouth daily.       Marland Kitchen warfarin (COUMADIN) 5 MG tablet Take 2.5-5 mg by mouth daily. Take 2.5mg  on mon, wed and fri. Take 5mg  the rest of the week.        Results for orders placed during the hospital encounter  of 09/04/12 (from the past 48 hour(s))  BASIC METABOLIC PANEL     Status: Abnormal   Collection Time    09/04/12  7:36 AM      Result Value Range   Sodium 142  135 - 145 mEq/L   Potassium 3.7  3.5 - 5.1 mEq/L   Chloride 103  96 - 112 mEq/L   CO2 27  19 - 32 mEq/L   Glucose, Bld 87  70 - 99 mg/dL   BUN 25 (*) 6 - 23 mg/dL   Creatinine, Ser 7.82  0.50 - 1.35 mg/dL   Calcium 9.9  8.4 - 95.6 mg/dL   GFR calc non Af Amer 55 (*) >90 mL/min   GFR calc Af Amer 64 (*) >90 mL/min   Comment:            The eGFR has been calculated     using the CKD EPI equation.     This calculation has not been     validated in all clinical     situations.     eGFR's persistently     <90 mL/min signify     possible Chronic Kidney Disease.  CBC  Status: Abnormal   Collection Time    09/04/12  7:36 AM      Result Value Range   WBC 6.9  4.0 - 10.5 K/uL   RBC 4.92  4.22 - 5.81 MIL/uL   Hemoglobin 15.7  13.0 - 17.0 g/dL   HCT 16.1  09.6 - 04.5 %   MCV 88.4  78.0 - 100.0 fL   MCH 31.9  26.0 - 34.0 pg   MCHC 36.1 (*) 30.0 - 36.0 g/dL   RDW 40.9  81.1 - 91.4 %   Platelets 157  150 - 400 K/uL  PROTIME-INR     Status: Abnormal   Collection Time    09/04/12  7:36 AM      Result Value Range   Prothrombin Time 21.8 (*) 11.6 - 15.2 seconds   INR 1.99 (*) 0.00 - 1.49   Dg Chest 2 View  09/04/2012  *RADIOLOGY REPORT*  Clinical Data: Preop for pacemaker change.  CHEST - 2 VIEW  Comparison: 10/17/2006.  Findings: The pacemaker wires are stable.  The heart is borderline enlarged but unchanged. Mild tortuosity and calcification of the thoracic aorta.  There are chronic bronchitic type interstitial lung changes but no acute pulmonary findings.  No pleural effusion. The bony thorax is intact.  IMPRESSION: Chronic lung changes but no acute cardiopulmonary findings.   Original Report Authenticated By: Rudie Meyer, M.D.     Review of Systems  Constitutional: Negative for fever.  HENT: Positive for congestion  (Sinus congestion). Negative for sore throat.   Respiratory: Positive for shortness of breath. Negative for cough.   Cardiovascular: Negative for chest pain, orthopnea and leg swelling.  Gastrointestinal: Negative for nausea, vomiting, abdominal pain, diarrhea, constipation and blood in stool.  Genitourinary: Negative for dysuria and hematuria.  Neurological: Negative for dizziness.    Blood pressure 173/81, pulse 133, temperature 97.4 F (36.3 C), temperature source Oral, resp. rate 18, height 6\' 2"  (1.88 m), weight 80.74 kg (178 lb), SpO2 94.00%. Physical Exam  Constitutional: He is oriented to person, place, and time. He appears well-developed and well-nourished. No distress.  HENT:  Head: Normocephalic and atraumatic.  Eyes: EOM are normal. Pupils are equal, round, and reactive to light. No scleral icterus.  Neck: Normal range of motion. Neck supple.  Cardiovascular: Normal rate, regular rhythm, S1 normal and S2 normal.   Murmur heard.  Systolic murmur is present with a grade of 2/6  Pulses:      Radial pulses are 2+ on the right side, and 2+ on the left side.       Dorsalis pedis pulses are 2+ on the right side, and 2+ on the left side.  Radiation of MR to carotids.  Respiratory: Effort normal and breath sounds normal. He has no wheezes. He has no rales.  GI: Soft. Bowel sounds are normal. He exhibits no distension. There is no tenderness.  Musculoskeletal: He exhibits no edema.  Lymphadenopathy:    He has no cervical adenopathy.  Neurological: He is alert and oriented to person, place, and time. He exhibits normal muscle tone.  Skin: Skin is warm and dry.  Psychiatric: He has a normal mood and affect.     Assessment/Plan Principal Problem:   Pacemaker at end of battery life Active Problems:   CAD (coronary artery disease)   Mitral regurgitation, Severe   Prostate cancer   HLD (hyperlipidemia)   HTN (hypertension)   Pulmonary HTN   A-fib   Anticoagulated on  warfarin  Plan:  Replacement of pacemaker generator.    Norman Lee 09/04/2012, 9:01 AM

## 2012-10-02 ENCOUNTER — Encounter: Payer: Self-pay | Admitting: Pharmacist Clinician (PhC)/ Clinical Pharmacy Specialist

## 2012-10-02 DIAGNOSIS — I4891 Unspecified atrial fibrillation: Secondary | ICD-10-CM

## 2012-10-02 DIAGNOSIS — Z7901 Long term (current) use of anticoagulants: Secondary | ICD-10-CM

## 2012-10-17 ENCOUNTER — Encounter: Payer: Self-pay | Admitting: *Deleted

## 2012-10-21 ENCOUNTER — Encounter: Payer: Self-pay | Admitting: Cardiovascular Disease

## 2012-10-28 ENCOUNTER — Other Ambulatory Visit: Payer: Self-pay | Admitting: Cardiovascular Disease

## 2012-10-28 LAB — HEPATIC FUNCTION PANEL
ALT: 14 U/L (ref 0–53)
Albumin: 4.7 g/dL (ref 3.5–5.2)
Bilirubin, Direct: 0.2 mg/dL (ref 0.0–0.3)

## 2012-10-28 LAB — LIPID PANEL
Cholesterol: 141 mg/dL (ref 0–200)
HDL: 48 mg/dL (ref >39)
LDL Cholesterol: 72 mg/dL (ref 0–99)
Total CHOL/HDL Ratio: 2.9 Ratio
Triglycerides: 106 mg/dL (ref <150)
VLDL: 21 mg/dL (ref 0–40)

## 2012-10-28 LAB — PROTIME-INR
INR: 3.15 — ABNORMAL HIGH (ref <1.50)
Prothrombin Time: 30.9 seconds — ABNORMAL HIGH (ref 11.6–15.2)

## 2012-10-28 LAB — PSA: PSA: 10.81 ng/mL — ABNORMAL HIGH (ref <=4.00)

## 2012-10-28 LAB — BASIC METABOLIC PANEL
CO2: 29 mEq/L (ref 19–32)
Glucose, Bld: 103 mg/dL — ABNORMAL HIGH (ref 70–99)
Potassium: 4.9 mEq/L (ref 3.5–5.3)
Sodium: 144 mEq/L (ref 135–145)

## 2012-11-13 ENCOUNTER — Telehealth: Payer: Self-pay | Admitting: *Deleted

## 2012-11-13 ENCOUNTER — Ambulatory Visit (INDEPENDENT_AMBULATORY_CARE_PROVIDER_SITE_OTHER): Payer: Self-pay | Admitting: Pharmacist Clinician (PhC)/ Clinical Pharmacy Specialist

## 2012-11-13 DIAGNOSIS — Z7901 Long term (current) use of anticoagulants: Secondary | ICD-10-CM

## 2012-11-13 DIAGNOSIS — I4891 Unspecified atrial fibrillation: Secondary | ICD-10-CM

## 2012-11-13 NOTE — Telephone Encounter (Signed)
Message copied by Gaynelle Cage on Fri Nov 13, 2012  2:00 PM ------      Message from: Nicki Guadalajara A      Created: Thu Nov 12, 2012  4:05 PM       within normal limits ------

## 2012-11-13 NOTE — Telephone Encounter (Signed)
Notified patient of lab results per Dr.Kelly's instructions.

## 2012-11-18 ENCOUNTER — Ambulatory Visit: Payer: Self-pay | Admitting: Ophthalmology

## 2012-11-18 DIAGNOSIS — I10 Essential (primary) hypertension: Secondary | ICD-10-CM

## 2012-11-18 LAB — POTASSIUM: Potassium: 4.6 mmol/L (ref 3.5–5.1)

## 2012-11-18 LAB — PROTIME-INR: INR: 2.5

## 2012-11-19 ENCOUNTER — Telehealth: Payer: Self-pay | Admitting: Pharmacist Clinician (PhC)/ Clinical Pharmacy Specialist

## 2012-11-19 NOTE — Telephone Encounter (Signed)
Message copied by Rosalee Kaufman on Thu Nov 19, 2012  3:42 PM ------      Message from: Nicki Guadalajara A      Created: Thu Nov 19, 2012  8:59 AM       Send PSA result to his urologist ------

## 2012-11-19 NOTE — Telephone Encounter (Signed)
Pt has copy of lab, will f/u.

## 2012-12-15 ENCOUNTER — Other Ambulatory Visit: Payer: Self-pay | Admitting: Pharmacist Clinician (PhC)/ Clinical Pharmacy Specialist

## 2012-12-15 MED ORDER — WARFARIN SODIUM 5 MG PO TABS: ORAL_TABLET | ORAL | Status: DC

## 2012-12-24 ENCOUNTER — Other Ambulatory Visit: Payer: Self-pay | Admitting: Pharmacist Clinician (PhC)/ Clinical Pharmacy Specialist

## 2012-12-24 MED ORDER — RAMIPRIL 10 MG PO CAPS: 10.0000 mg | ORAL_CAPSULE | Freq: Every day | ORAL | Status: DC

## 2013-01-18 ENCOUNTER — Telehealth: Payer: Self-pay | Admitting: Cardiovascular Disease

## 2013-01-18 NOTE — Telephone Encounter (Signed)
Returned a phone call to [place] in reference to the form that [name] needs signed. No answer or machine. Will leave a message on patient's cell number informing him per Dr. Tresa Endo that he will call him tomorrow with his concerns about signing the form.

## 2013-01-18 NOTE — Telephone Encounter (Signed)
spoke with patient informing him that Dr. Tresa Endo would like to speak with him before signing form for [facility] Reappointment Application. Patient requested for Dr. Tresa Endo to call him on his cell [phone].

## 2013-01-18 NOTE — Telephone Encounter (Signed)
Wants to know if you got Tresa Endo to sign the papers for Dr Vear Clock?

## 2013-01-19 ENCOUNTER — Telehealth: Payer: Self-pay | Admitting: *Deleted

## 2013-01-19 NOTE — Telephone Encounter (Signed)
Informed patient [facility] reappointment Application faxed to his office.

## 2013-01-29 NOTE — Progress Notes (Signed)
Quick Note:  Spoke with patient informing him dr Tresa Endo reviewed labs that was faxed to him and states they were normal. ______

## 2013-03-01 ENCOUNTER — Telehealth: Payer: Self-pay | Admitting: Pharmacist Clinician (PhC)/ Clinical Pharmacy Specialist

## 2013-03-01 NOTE — Telephone Encounter (Signed)
Overdue INR letter sent 

## 2013-03-16 ENCOUNTER — Other Ambulatory Visit: Payer: Self-pay | Admitting: Cardiovascular Disease

## 2013-03-16 DIAGNOSIS — I4891 Unspecified atrial fibrillation: Secondary | ICD-10-CM

## 2013-03-17 ENCOUNTER — Encounter: Payer: Self-pay | Admitting: *Deleted

## 2013-03-17 LAB — REMOTE PACEMAKER DEVICE
BRDY-0002RV: 70 {beats}/min
BRDY-0004RV: 130 {beats}/min
VENTRICULAR PACING PM: 99.7

## 2013-03-24 ENCOUNTER — Encounter: Payer: Self-pay | Admitting: Cardiovascular Disease

## 2013-03-24 ENCOUNTER — Ambulatory Visit (INDEPENDENT_AMBULATORY_CARE_PROVIDER_SITE_OTHER): Payer: Medicare Other | Admitting: Cardiovascular Disease

## 2013-03-24 ENCOUNTER — Ambulatory Visit: Payer: Commercial Managed Care - HMO | Admitting: Pharmacist Clinician (PhC)/ Clinical Pharmacy Specialist

## 2013-03-24 VITALS — BP 118/72 | HR 71 | Ht 74.0 in | Wt 179.4 lb

## 2013-03-24 DIAGNOSIS — E785 Hyperlipidemia, unspecified: Secondary | ICD-10-CM

## 2013-03-24 DIAGNOSIS — I10 Essential (primary) hypertension: Secondary | ICD-10-CM

## 2013-03-24 DIAGNOSIS — I2510 Atherosclerotic heart disease of native coronary artery without angina pectoris: Secondary | ICD-10-CM

## 2013-03-24 DIAGNOSIS — Z7901 Long term (current) use of anticoagulants: Secondary | ICD-10-CM

## 2013-03-24 DIAGNOSIS — I4891 Unspecified atrial fibrillation: Secondary | ICD-10-CM

## 2013-03-24 DIAGNOSIS — I059 Rheumatic mitral valve disease, unspecified: Secondary | ICD-10-CM

## 2013-03-24 DIAGNOSIS — I340 Nonrheumatic mitral (valve) insufficiency: Secondary | ICD-10-CM

## 2013-03-24 NOTE — Patient Instructions (Signed)
Dr Kelly wants you to follow-up in 6 months. You will receive a reminder letter in the mail two months in advance. If you don't receive a letter, please call our office to schedule the follow-up appointment. 

## 2013-03-26 ENCOUNTER — Encounter: Payer: Self-pay | Admitting: Cardiovascular Disease

## 2013-03-26 NOTE — Progress Notes (Signed)
Patient ID: Norman Lee, male   DOB: 1929/03/09, 79 y.o.   MRN: [id number]       HPI: Norman Lee, is a 79 y.o. male [place] today for a six-month cardiology evaluation.  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 He also has sick sinus syndrome as well as permanent atrial fibrillation and is status post single-chamber pacemaker implantation initially in May 2008 and in April 2014 he underwent a generator change with a current device being a Medtronic Adapta ADD RL1 device. The patient's last cardiac catheterization was in 2008 which showed progressive 70% eccentric mid right artery stenosis. Circumflex intervention sites were patent although he had residual disease 20-30% proximally the marginal vessel had 50-60% in the AV groove. It 30% LAD stenosis. 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 He has developed severe mitral regurgitation most likely due to a flail leaflet of the middle scallop of the posterior limb leaflet. We have discussed potential valve repair but ultimately he has refused this. I had seen him in March we had discussed about the possibility of cardiac catheterization for further evaluation. He has decided against this. He also has a history of prostate CA apparently he underwent cryoablation 2011 by Dr. Patsi Sears but his PSAs have been slowly rising again. He presents now for evaluation.  He does admit to shortness of breath with exertion; this has not changed. He denies recent chest pressure. He denies presyncope or syncope.  Past Medical History  Diagnosis Date  . Prostate cancer   . CAD (coronary artery disease)     Circumflex MI 1995, Last cath 2008  . Mitral regurgitation     Severe  . HLD (hyperlipidemia)   . HTN  (hypertension)   . Atrial fibrillation   . Pulmonary hypertension   . Presence of permanent cardiac pacemaker 10/16/2006,09/04/12    Medtronic adapta  . Chronic anticoagulation     Past Surgical History  Procedure Laterality Date  . Permanent pacemaker insertion  10/16/2006    Medtronic Enrhythm  . Permanent pacemaker generator change  09/04/2012    Medtronic adapta  . Coronary angioplasty  09/16/1993    100% CX-successful  . Cardiac catheterization  05/11/2002    70-80% small first diagonal,50% RCA  . Cardiac catheterization  10/14/2006    multivessell,EF 40-50%  . Nm myoview ltd  08/30/2009    mod. perfusion defect-mid inferior,apical inferior,basal inferolateral,mid inferolateral & apical lateral regions  . US echocardiography  03/27/2012    severe MR persists w/progressive LA dilatation,RSVP has increased & AS has progressed    Allergies  Allergen Reactions  . Codeine Nausea And Vomiting    Current Outpatient Prescriptions  Medication Sig Dispense Refill  . aspirin 81 MG tablet Take 81 mg by mouth daily.      . furosemide (LASIX) 20 MG tablet Take 20 mg by mouth daily as needed.      . Multiple Vitamin (MULTIVITAMIN WITH MINERALS) TABS Take 1 tablet by mouth daily.      . nebivolol (BYSTOLIC) 5 MG tablet Take 5 mg by mouth daily.      . ramipril (ALTACE) 10 MG capsule Take 1 capsule (10 mg total) by mouth daily.  90 capsule  3  . simvastatin (ZOCOR) 20 MG tablet Take 20 mg  by mouth at bedtime.      Marland Kitchen warfarin (COUMADIN) 5 MG tablet Take 1 tablet by mouth daily or as directed  90 tablet  0   No current facility-administered medications for this visit.    History   Social History  . Marital Status: Married    Spouse Name: N/A    Number of Children: N/A  . Years of Education: N/A   Occupational History  . Not on file.   Social History Main Topics  . Smoking status: Never Smoker   . Smokeless tobacco: Never Used  . Alcohol Use: No  . Drug Use: Not on file  . Sexual  Activity: Not on file   Other Topics Concern  . Not on file   Social History Narrative  . No narrative on file    Family History  Problem Relation Age of Onset  . Diabetes Sister     ROS is negative for fevers, chills or night sweats. He denies weight change. He denies bleeding issues. He does note shortness of breath with activity. He denies chest pressure. He does have permanent atrial fibrillation. He denies presyncope or syncope. He denies abdominal discomfort nausea vomiting or diarrhea. His PSAs have been rising. He is followed by Dr. Patsi Sears. He denies significant edema. He denies claudication.  Other comprehensive 12 point system review is negative.  PE BP 118/72  Pulse 71  Ht 6\' 2"  (1.88 m)  Wt 179 lb 6.4 oz (81.375 kg)  BMI 23.02 kg/m2  General: Alert, oriented, no distress.  Skin: normal turgor, no rashes HEENT: Normocephalic, atraumatic. Pupils round and reactive; sclera anicteric;no lid lag.  Nose without nasal septal hypertrophy Mouth/Parynx benign; Mallinpatti scale 3 Neck: No JVD, no carotid briuts Lungs: clear to ausculatation and percussion; no wheezing or rales Heart: Irregularly irregular rhythm with controlled ventricular rate proximally 70, s1 s2 normal 2-3/6 holosystolic murmur at the apex radiating to the axilla concordant with his severe mitral regurgitation. Abdomen: soft, nontender; no hepatosplenomehaly, BS+; abdominal aorta nontender and not dilated by palpation. Pulses 2+ Extremities: no clubbing cyanosis or edema, Homan's sign negative  Neurologic: grossly nonfocal Psychological: Intact mood and affect  ECG: The patient rhythm with underlying atrial fibrillation. Ventricular rate 71 beats per minute  LABS:  BMET    Component Value Date/Time   NA 144 10/28/2012 1135   K 4.9 10/28/2012 1135   CL 105 10/28/2012 1135   CO2 29 10/28/2012 1135   GLUCOSE 103* 10/28/2012 1135   BUN 20 10/28/2012 1135   CREATININE 1.23 10/28/2012 1135   CREATININE  1.20 09/04/2012 0736   CALCIUM 9.9 10/28/2012 1135   GFRNONAA 55* 09/04/2012 0736   GFRAA 64* 09/04/2012 0736     Hepatic Function Panel     Component Value Date/Time   PROT 7.2 10/28/2012 1135   ALBUMIN 4.7 10/28/2012 1135   AST 19 10/28/2012 1135   ALT 14 10/28/2012 1135   ALKPHOS 54 10/28/2012 1135   BILITOT 1.1 10/28/2012 1135   BILIDIR 0.2 10/28/2012 1135   IBILI 0.9 10/28/2012 1135     CBC    Component Value Date/Time   WBC 6.9 09/04/2012 0736   RBC 4.92 09/04/2012 0736   HGB 15.7 09/04/2012 0736   HCT 43.5 09/04/2012 0736   PLT 157 09/04/2012 0736   MCV 88.4 09/04/2012 0736   MCH 31.9 09/04/2012 0736   MCHC 36.1* 09/04/2012 0736   RDW 14.2 09/04/2012 0736     BNP No results found for this  basename: probnp    Lipid Panel     Component Value Date/Time   CHOL 141 10/28/2012 1135   TRIG 106 10/28/2012 1135   HDL 48 10/28/2012 1135   CHOLHDL 2.9 10/28/2012 1135   VLDL 21 10/28/2012 1135   LDLCALC 72 10/28/2012 1135     RADIOLOGY: No results found.    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 He is documented upper lateral wall scar from this event. His last cardiac catheterization was X. Years ago and he's been stable without recurrent chest pain. His last nuclear perfusion study was March 2011 which showed his old left circumflex inferior to inferolateral scar without associated ischemia. He is status post pacemaker generator change secondary to previous end of life on his battery. He does have severe mitral regurgitation most likely due to a flail leaflet. He has decided against consideration for mitral valve repair. He is followed by Dr. Patsi Sears for his rising PSA levels and is status post cryoablation in 2011 4 prostate CA. He is monitoring his laboratory. I have seen his lipid studies. His INR has been therapeutic  consistently. I will see him in 6 months for followup evaluation.     Lennette Bihari, MD, [facility]  03/26/2013 1:43 PM

## 2013-03-31 LAB — PROTIME-INR: INR: 2.7 — AB (ref <=1.1)

## 2013-04-01 ENCOUNTER — Encounter: Payer: Self-pay | Admitting: Cardiovascular Disease

## 2013-04-01 ENCOUNTER — Ambulatory Visit (INDEPENDENT_AMBULATORY_CARE_PROVIDER_SITE_OTHER): Payer: Medicare Other | Admitting: Pharmacist Clinician (PhC)/ Clinical Pharmacy Specialist

## 2013-04-01 DIAGNOSIS — Z7901 Long term (current) use of anticoagulants: Secondary | ICD-10-CM

## 2013-04-01 DIAGNOSIS — I4891 Unspecified atrial fibrillation: Secondary | ICD-10-CM

## 2013-04-06 ENCOUNTER — Other Ambulatory Visit: Payer: Self-pay | Admitting: Pharmacist Clinician (PhC)/ Clinical Pharmacy Specialist

## 2013-04-06 MED ORDER — WARFARIN SODIUM 5 MG PO TABS: ORAL_TABLET | ORAL | Status: DC

## 2013-05-12 ENCOUNTER — Ambulatory Visit (INDEPENDENT_AMBULATORY_CARE_PROVIDER_SITE_OTHER): Payer: Medicare Other | Admitting: Cardiovascular Disease

## 2013-05-12 ENCOUNTER — Encounter: Payer: Self-pay | Admitting: Cardiovascular Disease

## 2013-05-12 VITALS — BP 110/90 | HR 93 | Resp 16 | Ht 74.0 in | Wt 180.9 lb

## 2013-05-12 DIAGNOSIS — I059 Rheumatic mitral valve disease, unspecified: Secondary | ICD-10-CM

## 2013-05-12 DIAGNOSIS — Z950 Presence of cardiac pacemaker: Secondary | ICD-10-CM

## 2013-05-12 DIAGNOSIS — I340 Nonrheumatic mitral (valve) insufficiency: Secondary | ICD-10-CM

## 2013-05-12 DIAGNOSIS — I2510 Atherosclerotic heart disease of native coronary artery without angina pectoris: Secondary | ICD-10-CM

## 2013-05-12 DIAGNOSIS — I4891 Unspecified atrial fibrillation: Secondary | ICD-10-CM

## 2013-05-12 LAB — MDC_IDC_ENUM_SESS_TYPE_INCLINIC
Battery Impedance: 108 Ohm
Battery Remaining Longevity: 13
Brady Statistic RV Percent Paced: 99.7 %
Lead Channel Pacing Threshold Amplitude: 0.75 V
Lead Channel Pacing Threshold Pulse Width: 0.4 ms

## 2013-05-12 NOTE — Patient Instructions (Signed)
Remote monitoring is used to monitor your pacemaker from home. This monitoring reduces the number of office visits required to check your device to one time per year. It allows [place] to keep an eye on the functioning of your device to ensure it is working properly. You are scheduled for a device check from home on 08-13-2013. You may send your transmission at any time that day. If you have a wireless device, the transmission will be sent automatically. After your physician reviews your transmission, you will receive a postcard with your next transmission date.  Your physician recommends that you schedule a follow-up appointment in: 12 months

## 2013-05-13 ENCOUNTER — Encounter: Payer: Self-pay | Admitting: Cardiovascular Disease

## 2013-05-13 NOTE — Assessment & Plan Note (Signed)
Dual chamber device programmed VVIR for permanent atrial fibrillation with slow ventricular response. He does have an underlying ventricular rhythm at 35 beats per minute, but paces 99.7% of the time. He should be considered pacemaker dependent. There have been very infrequent episodes of high ventricular rates. All the parameters appear to be normal. No changes are made to device settings at this appointment. Continue remote monitoring every 3 months, followup with in office check yearly or

## 2013-05-13 NOTE — Assessment & Plan Note (Signed)
Currently asymptomatic 

## 2013-05-13 NOTE — Assessment & Plan Note (Signed)
Severe mitral insufficiency secondary to mitral valve prolapse with flail leaflet remains asymptomatic. Since he is not willing to undergo mitral valve repair periodic reevaluation with echo seems to have become a moot point.

## 2013-05-13 NOTE — Progress Notes (Signed)
Patient ID: Norman Lee, male   DOB: 1928/11/30, 79 y.o.   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 He had a generator change out in March of this year. His initial dual chamber device and leads were placed in 2008 but he is now permanent atrial fibrillation and his device is programmed VVIR. The device is a Medtronic Adapta.  Interrogation of his pacemaker today shows normal function. He is on chronic warfarin anticoagulation without any problems. His electrocardiogram shows ventricular paced rhythm. He is essentially pacemaker dependent although he does have a slow escape rhythm at about 35 beats per minutes.  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 He has metastatic prostate cancer and feels that open heart surgery with not lead to any overall gains.  Allergies  Allergen Reactions  . Codeine Nausea And Vomiting    Current Outpatient Prescriptions  Medication Sig Dispense Refill  . aspirin 81 MG tablet Take 81 mg by mouth daily.      . furosemide (LASIX) 20 MG tablet Take 20 mg by mouth daily as needed.      . Multiple Vitamin (MULTIVITAMIN WITH MINERALS) TABS Take 1 tablet by mouth daily.      . nebivolol (BYSTOLIC) 5 MG tablet Take 5 mg by mouth daily.      . ramipril (ALTACE) 10 MG capsule Take 1 capsule (10 mg total) by mouth daily.  90 capsule  3  . simvastatin (ZOCOR) 20 MG tablet Take 20 mg by mouth at bedtime.      Marland Kitchen warfarin (COUMADIN) 5 MG tablet Take 1 tablet by mouth daily or as directed  90 tablet  0   No current facility-administered medications for this visit.    Past Medical History  Diagnosis Date  . Prostate cancer   . CAD (coronary artery disease)     Circumflex MI 1995, Last cath 2008  . Mitral  regurgitation     Severe  . HLD (hyperlipidemia)   . HTN (hypertension)   . Atrial fibrillation   . Pulmonary hypertension   . Presence of permanent cardiac pacemaker 10/16/2006,09/04/12    Medtronic adapta  . Chronic anticoagulation     Past Surgical History  Procedure Laterality Date  . Permanent pacemaker insertion  10/16/2006    Medtronic Enrhythm  . Permanent pacemaker generator change  09/04/2012    Medtronic adapta  . Coronary angioplasty  09/16/1993    100% CX-successful  . Cardiac catheterization  05/11/2002    70-80% small first diagonal,50% RCA  . Cardiac catheterization  10/14/2006    multivessell,EF 40-50%  . Nm myoview ltd  08/30/2009    mod. perfusion defect-mid inferior,apical inferior,basal inferolateral,mid inferolateral & apical lateral regions  . US echocardiography  03/27/2012    severe MR persists w/progressive LA dilatation,RSVP has increased & AS has progressed    Family History  Problem Relation Age of Onset  . Diabetes Sister     History   Social History  . Marital Status: Married    Spouse Name: N/A    Number of Children: N/A  . Years of Education: N/A   Occupational History  . Not on file.   Social History Main Topics  . Smoking status: Never Smoker   . Smokeless tobacco: Never Used  .  Alcohol Use: No  . Drug Use: Not on file  . Sexual Activity: Not on file   Other Topics Concern  . Not on file   Social History Narrative  . No narrative on file    Review of systems: The patient specifically denies any chest pain at rest or with exertion, dyspnea at rest (he has class II dyspnea with exertion), orthopnea, paroxysmal nocturnal dyspnea, syncope, palpitations, focal neurological deficits, intermittent claudication, lower extremity edema, unexplained weight gain, cough, hemoptysis or wheezing.  The patient also denies abdominal pain, nausea, vomiting, dysphagia, diarrhea, constipation, polyuria, polydipsia, dysuria, hematuria, frequency,  urgency, abnormal bleeding or bruising, fever, chills, unexpected weight changes, mood swings, change in skin or hair texture, change in voice quality, auditory or visual problems, allergic reactions or rashes, new musculoskeletal complaints other than usual "aches and pains".   PHYSICAL EXAM BP 110/90  Pulse 93  Resp 16  Ht 6\' 2"  (1.88 m)  Wt 180 lb 14.4 oz (82.056 kg)  BMI 23.22 kg/m2  General: Alert, oriented x3, no distress Head: no evidence of trauma, PERRL, EOMI, no exophtalmos or lid lag, no myxedema, no xanthelasma; normal ears, nose and oropharynx Neck: normal jugular venous pulsations and no hepatojugular reflux; brisk carotid pulses without delay and no carotid bruits Chest: clear to auscultation, no signs of consolidation by percussion or palpation, normal fremitus, symmetrical and full respiratory excursions; healthy left subclavian pacemaker site Cardiovascular: normal position and quality of the apical impulse, regular rhythm, normal first and paradoxically split second heart sounds, no rubs, +ve S3 gallop, 3/6 holosystolic late peaking very musical murmur heard at the apex radiating both for the eczema and the base of the precordium Abdomen: no tenderness or distention, no masses by palpation, no abnormal pulsatility or arterial bruits, normal bowel sounds, no hepatosplenomegaly Extremities: no clubbing, cyanosis or edema; 2+ radial, ulnar and brachial pulses bilaterally; 2+ right femoral, posterior tibial and dorsalis pedis pulses; 2+ left femoral, posterior tibial and dorsalis pedis pulses; no subclavian or femoral bruits Neurological: grossly nonfocal   EKG: Atrial fibrillation, 100% ventricular paced  Lipid Panel     Component Value Date/Time   CHOL 141 10/28/2012 1135   TRIG 106 10/28/2012 1135   HDL 48 10/28/2012 1135   CHOLHDL 2.9 10/28/2012 1135   VLDL 21 10/28/2012 1135   LDLCALC 72 10/28/2012 1135    BMET    Component Value Date/Time   NA 144 10/28/2012 1135    K 4.9 10/28/2012 1135   CL 105 10/28/2012 1135   CO2 29 10/28/2012 1135   GLUCOSE 103* 10/28/2012 1135   BUN 20 10/28/2012 1135   CREATININE 1.23 10/28/2012 1135   CREATININE 1.20 09/04/2012 0736   CALCIUM 9.9 10/28/2012 1135   GFRNONAA 55* 09/04/2012 0736   GFRAA 64* 09/04/2012 0736     ASSESSMENT AND PLAN Mitral regurgitation, Severe Severe mitral insufficiency secondary to mitral valve prolapse with flail leaflet remains asymptomatic. Since he is not willing to undergo mitral valve repair periodic reevaluation with echo seems to have become a moot point.  CAD (coronary artery disease) Currently asymptomatic.  Pacemaker - Medtronic Adapta 2014, leads 2008 Dual chamber device programmed VVIR for permanent atrial fibrillation with slow ventricular response. He does have an underlying ventricular rhythm at 35 beats per minute, but paces 99.7% of the time. He should be considered pacemaker dependent. There have been very infrequent episodes of high ventricular rates. All the parameters appear to be normal. No changes are made to device settings  at this appointment. Continue remote monitoring every 3 months, followup with in office check yearly or  A-fib 1 warfarin anti-coagulation without bleeding complications and without history of stroke or TIA.  Patient Instructions  Remote monitoring is used to monitor your pacemaker from home. This monitoring reduces the number of office visits required to check your device to one time per year. It allows [place] to keep an eye on the functioning of your device to ensure it is working properly. You are scheduled for a device check from home on 08-13-2013. You may send your transmission at any time that day. If you have a wireless device, the transmission will be sent automatically. After your physician reviews your transmission, you will receive a postcard with your next transmission date.  Your physician recommends that you schedule a follow-up appointment in: 12  months        Orders Placed This Encounter  Procedures  . EKG 12-Lead   No orders of the defined types were placed in this encounter.    Junious Silk, MD, [facility] [phone] office [phone] pager

## 2013-05-13 NOTE — Assessment & Plan Note (Signed)
1 warfarin anti-coagulation without bleeding complications and without history of stroke or TIA.

## 2013-07-01 ENCOUNTER — Other Ambulatory Visit: Payer: Self-pay | Admitting: *Deleted

## 2013-07-01 MED ORDER — SIMVASTATIN 20 MG PO TABS
20.0000 mg | ORAL_TABLET | Freq: Every day | ORAL | Status: DC
Start: 2013-07-01 — End: 2013-10-16

## 2013-07-01 NOTE — Telephone Encounter (Signed)
Rx was sent to pharmacy electronically. 

## 2013-07-17 ENCOUNTER — Other Ambulatory Visit: Payer: Self-pay | Admitting: Pharmacist Clinician (PhC)/ Clinical Pharmacy Specialist

## 2013-07-27 ENCOUNTER — Other Ambulatory Visit: Payer: Self-pay | Admitting: *Deleted

## 2013-07-27 MED ORDER — FUROSEMIDE 20 MG PO TABS: 20.0000 mg | ORAL_TABLET | Freq: Every day | ORAL | Status: DC | PRN

## 2013-08-13 ENCOUNTER — Encounter: Payer: Medicare Other | Admitting: *Deleted

## 2013-08-20 ENCOUNTER — Ambulatory Visit: Payer: Self-pay | Admitting: Pharmacist Clinician (PhC)/ Clinical Pharmacy Specialist

## 2013-08-20 DIAGNOSIS — I4891 Unspecified atrial fibrillation: Secondary | ICD-10-CM

## 2013-08-20 DIAGNOSIS — Z7901 Long term (current) use of anticoagulants: Secondary | ICD-10-CM

## 2013-08-24 ENCOUNTER — Encounter: Payer: Self-pay | Admitting: *Deleted

## 2013-09-14 ENCOUNTER — Ambulatory Visit (INDEPENDENT_AMBULATORY_CARE_PROVIDER_SITE_OTHER): Payer: Commercial Managed Care - HMO | Admitting: *Deleted

## 2013-09-14 ENCOUNTER — Encounter: Payer: Self-pay | Admitting: Cardiovascular Disease

## 2013-09-14 DIAGNOSIS — Z950 Presence of cardiac pacemaker: Secondary | ICD-10-CM

## 2013-09-14 DIAGNOSIS — I4891 Unspecified atrial fibrillation: Secondary | ICD-10-CM

## 2013-09-14 LAB — MDC_IDC_ENUM_SESS_TYPE_REMOTE
Battery Impedance: 108 Ohm
Battery Remaining Longevity: 154 mo
Brady Statistic RV Percent Paced: 100 %
Lead Channel Impedance Value: 445 Ohm
Lead Channel Impedance Value: 67 Ohm
Lead Channel Setting Pacing Pulse Width: 0.4 ms
Lead Channel Setting Sensing Sensitivity: 2.8 mV
MDC IDC MSMT BATTERY VOLTAGE: 2.78 V
MDC IDC MSMT LEADCHNL RV PACING THRESHOLD AMPLITUDE: 0.75 V
MDC IDC MSMT LEADCHNL RV PACING THRESHOLD PULSEWIDTH: 0.4 ms
MDC IDC SESS DTM: 20150331172029
MDC IDC SET LEADCHNL RV PACING AMPLITUDE: 2 V

## 2013-10-06 LAB — PROTIME-INR: INR: 2.2 — AB (ref <=1.1)

## 2013-10-07 ENCOUNTER — Ambulatory Visit (INDEPENDENT_AMBULATORY_CARE_PROVIDER_SITE_OTHER): Payer: Commercial Managed Care - HMO | Admitting: Pharmacist Clinician (PhC)/ Clinical Pharmacy Specialist

## 2013-10-07 DIAGNOSIS — Z7901 Long term (current) use of anticoagulants: Secondary | ICD-10-CM

## 2013-10-07 DIAGNOSIS — I4891 Unspecified atrial fibrillation: Secondary | ICD-10-CM

## 2013-10-08 ENCOUNTER — Encounter: Payer: Self-pay | Admitting: *Deleted

## 2013-10-15 ENCOUNTER — Ambulatory Visit (INDEPENDENT_AMBULATORY_CARE_PROVIDER_SITE_OTHER): Payer: Commercial Managed Care - HMO | Admitting: Cardiovascular Disease

## 2013-10-15 ENCOUNTER — Ambulatory Visit
Admission: RE | Admit: 2013-10-15 | Discharge: 2013-10-15 | Disposition: A | Discharge status: Home / Self Care | Payer: Commercial Managed Care - HMO | Source: Ambulatory Visit | Attending: Cardiovascular Disease | Admitting: Cardiovascular Disease

## 2013-10-15 ENCOUNTER — Encounter: Payer: Self-pay | Admitting: Cardiovascular Disease

## 2013-10-15 VITALS — BP 128/78 | HR 72 | Ht 74.0 in | Wt 173.3 lb

## 2013-10-15 DIAGNOSIS — C61 Malignant neoplasm of prostate: Secondary | ICD-10-CM

## 2013-10-15 DIAGNOSIS — R05 Cough: Secondary | ICD-10-CM

## 2013-10-15 DIAGNOSIS — R0602 Shortness of breath: Secondary | ICD-10-CM

## 2013-10-15 DIAGNOSIS — I10 Essential (primary) hypertension: Secondary | ICD-10-CM

## 2013-10-15 DIAGNOSIS — I340 Nonrheumatic mitral (valve) insufficiency: Secondary | ICD-10-CM

## 2013-10-15 DIAGNOSIS — I2789 Other specified pulmonary heart diseases: Secondary | ICD-10-CM

## 2013-10-15 DIAGNOSIS — E785 Hyperlipidemia, unspecified: Secondary | ICD-10-CM

## 2013-10-15 DIAGNOSIS — I4891 Unspecified atrial fibrillation: Secondary | ICD-10-CM

## 2013-10-15 DIAGNOSIS — R059 Cough, unspecified: Secondary | ICD-10-CM

## 2013-10-15 DIAGNOSIS — I272 Pulmonary hypertension, unspecified: Secondary | ICD-10-CM

## 2013-10-15 DIAGNOSIS — I059 Rheumatic mitral valve disease, unspecified: Secondary | ICD-10-CM

## 2013-10-15 DIAGNOSIS — I2510 Atherosclerotic heart disease of native coronary artery without angina pectoris: Secondary | ICD-10-CM

## 2013-10-15 DIAGNOSIS — Z7901 Long term (current) use of anticoagulants: Secondary | ICD-10-CM

## 2013-10-15 MED ORDER — LEVOFLOXACIN 500 MG PO TABS: 500.0000 mg | ORAL_TABLET | Freq: Every day | ORAL | Status: DC

## 2013-10-15 NOTE — Patient Instructions (Signed)
Your physician recommends that you return for lab work. Check your INR on Monday.  A chest x-ray takes a picture of the organs and structures inside the chest, including the heart, lungs, and blood vessels. This test can show several things, including, whether the heart is enlarges; whether fluid is building up in the lungs; and whether pacemaker / defibrillator leads are still in place.  Your physician recommends that you schedule a follow-up appointment in: 3  Months.  Your physician has recommended you make the following change in your medication:start levaquin. This has already been sent to the pharmacy.

## 2013-10-16 ENCOUNTER — Inpatient Hospital Stay (HOSPITAL_COMMUNITY)
Admission: EM | Admit: 2013-10-16 | Discharge: 2013-10-18 | DRG: 871 | Disposition: A | Discharge status: Home / Self Care | Payer: Medicare HMO | Attending: Critical Care Medicine | Admitting: Critical Care Medicine

## 2013-10-16 ENCOUNTER — Encounter (HOSPITAL_COMMUNITY): Payer: Self-pay | Admitting: Emergency Medicine

## 2013-10-16 ENCOUNTER — Emergency Department (HOSPITAL_COMMUNITY): Payer: Medicare HMO

## 2013-10-16 ENCOUNTER — Encounter: Payer: Self-pay | Admitting: Cardiovascular Disease

## 2013-10-16 DIAGNOSIS — R6520 Severe sepsis without septic shock: Secondary | ICD-10-CM

## 2013-10-16 DIAGNOSIS — I2789 Other specified pulmonary heart diseases: Secondary | ICD-10-CM | POA: Diagnosis present

## 2013-10-16 DIAGNOSIS — E785 Hyperlipidemia, unspecified: Secondary | ICD-10-CM

## 2013-10-16 DIAGNOSIS — I495 Sick sinus syndrome: Secondary | ICD-10-CM | POA: Diagnosis present

## 2013-10-16 DIAGNOSIS — R7309 Other abnormal glucose: Secondary | ICD-10-CM | POA: Diagnosis present

## 2013-10-16 DIAGNOSIS — N289 Disorder of kidney and ureter, unspecified: Secondary | ICD-10-CM | POA: Diagnosis present

## 2013-10-16 DIAGNOSIS — Z950 Presence of cardiac pacemaker: Secondary | ICD-10-CM

## 2013-10-16 DIAGNOSIS — N189 Chronic kidney disease, unspecified: Secondary | ICD-10-CM | POA: Diagnosis present

## 2013-10-16 DIAGNOSIS — I252 Old myocardial infarction: Secondary | ICD-10-CM

## 2013-10-16 DIAGNOSIS — Z8546 Personal history of malignant neoplasm of prostate: Secondary | ICD-10-CM

## 2013-10-16 DIAGNOSIS — I359 Nonrheumatic aortic valve disorder, unspecified: Secondary | ICD-10-CM

## 2013-10-16 DIAGNOSIS — Z7901 Long term (current) use of anticoagulants: Secondary | ICD-10-CM

## 2013-10-16 DIAGNOSIS — I350 Nonrheumatic aortic (valve) stenosis: Secondary | ICD-10-CM

## 2013-10-16 DIAGNOSIS — A419 Sepsis, unspecified organism: Principal | ICD-10-CM | POA: Diagnosis present

## 2013-10-16 DIAGNOSIS — I4891 Unspecified atrial fibrillation: Secondary | ICD-10-CM | POA: Diagnosis present

## 2013-10-16 DIAGNOSIS — I340 Nonrheumatic mitral (valve) insufficiency: Secondary | ICD-10-CM

## 2013-10-16 DIAGNOSIS — I129 Hypertensive chronic kidney disease with stage 1 through stage 4 chronic kidney disease, or unspecified chronic kidney disease: Secondary | ICD-10-CM | POA: Diagnosis present

## 2013-10-16 DIAGNOSIS — J9600 Acute respiratory failure, unspecified whether with hypoxia or hypercapnia: Secondary | ICD-10-CM | POA: Diagnosis present

## 2013-10-16 DIAGNOSIS — I2510 Atherosclerotic heart disease of native coronary artery without angina pectoris: Secondary | ICD-10-CM | POA: Diagnosis present

## 2013-10-16 DIAGNOSIS — E872 Acidosis, unspecified: Secondary | ICD-10-CM | POA: Diagnosis present

## 2013-10-16 DIAGNOSIS — Z7982 Long term (current) use of aspirin: Secondary | ICD-10-CM

## 2013-10-16 DIAGNOSIS — I214 Non-ST elevation (NSTEMI) myocardial infarction: Secondary | ICD-10-CM | POA: Diagnosis present

## 2013-10-16 DIAGNOSIS — I272 Pulmonary hypertension, unspecified: Secondary | ICD-10-CM

## 2013-10-16 DIAGNOSIS — J45909 Unspecified asthma, uncomplicated: Secondary | ICD-10-CM | POA: Diagnosis present

## 2013-10-16 DIAGNOSIS — Z79899 Other long term (current) drug therapy: Secondary | ICD-10-CM

## 2013-10-16 DIAGNOSIS — I059 Rheumatic mitral valve disease, unspecified: Secondary | ICD-10-CM | POA: Diagnosis present

## 2013-10-16 DIAGNOSIS — I10 Essential (primary) hypertension: Secondary | ICD-10-CM

## 2013-10-16 DIAGNOSIS — C61 Malignant neoplasm of prostate: Secondary | ICD-10-CM

## 2013-10-16 DIAGNOSIS — I509 Heart failure, unspecified: Secondary | ICD-10-CM | POA: Diagnosis present

## 2013-10-16 DIAGNOSIS — J189 Pneumonia, unspecified organism: Secondary | ICD-10-CM | POA: Diagnosis present

## 2013-10-16 DIAGNOSIS — J9690 Respiratory failure, unspecified, unspecified whether with hypoxia or hypercapnia: Secondary | ICD-10-CM | POA: Diagnosis present

## 2013-10-16 LAB — GLUCOSE, CAPILLARY
GLUCOSE-CAPILLARY: 133 mg/dL — AB (ref 70–99)
GLUCOSE-CAPILLARY: 129 mg/dL — AB (ref 70–99)
Glucose-Capillary: 142 mg/dL — ABNORMAL HIGH (ref 70–99)

## 2013-10-16 LAB — URINALYSIS, ROUTINE W REFLEX MICROSCOPIC
Bilirubin Urine: NEGATIVE
Glucose, UA: NEGATIVE mg/dL
Ketones, ur: NEGATIVE mg/dL
Leukocytes, UA: NEGATIVE
Nitrite: NEGATIVE
PROTEIN: 30 mg/dL — AB
Specific Gravity, Urine: 1.023 (ref 1.005–1.030)
UROBILINOGEN UA: 1 mg/dL (ref 0.0–1.0)
pH: 5 (ref 5.0–8.0)

## 2013-10-16 LAB — I-STAT VENOUS BLOOD GAS, ED
ACID-BASE DEFICIT: 2 mmol/L (ref 0.0–2.0)
Acid-base deficit: 2 mmol/L (ref 0.0–2.0)
Bicarbonate: 26.2 mEq/L — ABNORMAL HIGH (ref 20.0–24.0)
Bicarbonate: 23.6 mEq/L (ref 20.0–24.0)
O2 Saturation: 21 %
O2 Saturation: 79 %
TCO2: 28 mmol/L (ref 0–100)
TCO2: 25 mmol/L (ref 0–100)
pCO2, Ven: 63.8 mmHg — ABNORMAL HIGH (ref 45.0–50.0)
pCO2, Ven: 48.2 mmHg (ref 45.0–50.0)
pH, Ven: 7.232 — ABNORMAL LOW (ref 7.250–7.300)
pH, Ven: 7.307 — ABNORMAL HIGH (ref 7.250–7.300)
pO2, Ven: 21 mmHg — CL (ref 30.0–45.0)
pO2, Ven: 53 mmHg — ABNORMAL HIGH (ref 30.0–45.0)

## 2013-10-16 LAB — COMPREHENSIVE METABOLIC PANEL
ALK PHOS: 62 U/L (ref 39–117)
ALT: 12 U/L (ref 0–53)
AST: 20 U/L (ref 0–37)
Albumin: 4.2 g/dL (ref 3.5–5.2)
BILIRUBIN TOTAL: 1.8 mg/dL — AB (ref 0.3–1.2)
BUN: 20 mg/dL (ref 6–23)
CHLORIDE: 94 meq/L — AB (ref 96–112)
CO2: 26 mEq/L (ref 19–32)
Calcium: 9.4 mg/dL (ref 8.4–10.5)
Creatinine, Ser: 1.12 mg/dL (ref 0.50–1.35)
GFR, EST AFRICAN AMERICAN: 69 mL/min — AB (ref >90)
GFR, EST NON AFRICAN AMERICAN: 59 mL/min — AB (ref >90)
GLUCOSE: 144 mg/dL — AB (ref 70–99)
POTASSIUM: 4.2 meq/L (ref 3.7–5.3)
SODIUM: 136 meq/L — AB (ref 137–147)
Total Protein: 7.7 g/dL (ref 6.0–8.3)

## 2013-10-16 LAB — TROPONIN I
TROPONIN I: 0.68 ng/mL — AB (ref <0.30)
TROPONIN I: 0.44 ng/mL — AB (ref <0.30)
Troponin I: 0.71 ng/mL (ref <0.30)
Troponin I: 0.65 ng/mL (ref <0.30)
Troponin I: 0.39 ng/mL (ref <0.30)

## 2013-10-16 LAB — PROTIME-INR
INR: 1.74 — AB (ref 0.00–1.49)
INR: 1.76 — ABNORMAL HIGH (ref 0.00–1.49)
Prothrombin Time: 19.8 seconds — ABNORMAL HIGH (ref 11.6–15.2)
Prothrombin Time: 20 seconds — ABNORMAL HIGH (ref 11.6–15.2)

## 2013-10-16 LAB — CBC
HCT: 44.2 % (ref 39.0–52.0)
HCT: 38.6 % — ABNORMAL LOW (ref 39.0–52.0)
Hemoglobin: 15.8 g/dL (ref 13.0–17.0)
Hemoglobin: 13.3 g/dL (ref 13.0–17.0)
MCH: 33.4 pg (ref 26.0–34.0)
MCH: 32.3 pg (ref 26.0–34.0)
MCHC: 35.7 g/dL (ref 30.0–36.0)
MCHC: 34.5 g/dL (ref 30.0–36.0)
MCV: 93.4 fL (ref 78.0–100.0)
MCV: 93.7 fL (ref 78.0–100.0)
PLATELETS: 174 10*3/uL (ref 150–400)
PLATELETS: 153 10*3/uL (ref 150–400)
RBC: 4.73 MIL/uL (ref 4.22–5.81)
RBC: 4.12 MIL/uL — AB (ref 4.22–5.81)
RDW: 14.5 % (ref 11.5–15.5)
RDW: 14.8 % (ref 11.5–15.5)
WBC: 14.5 10*3/uL — ABNORMAL HIGH (ref 4.0–10.5)
WBC: 10.6 10*3/uL — ABNORMAL HIGH (ref 4.0–10.5)

## 2013-10-16 LAB — MRSA PCR SCREENING: MRSA by PCR: NEGATIVE

## 2013-10-16 LAB — CK TOTAL AND CKMB (NOT AT ARMC)
CK TOTAL: 49 U/L (ref 7–232)
CK, MB: 5.3 ng/mL — ABNORMAL HIGH (ref 0.3–4.0)
Relative Index: INVALID (ref 0.0–2.5)

## 2013-10-16 LAB — I-STAT TROPONIN, ED: Troponin i, poc: 0.01 ng/mL (ref 0.00–0.08)

## 2013-10-16 LAB — CREATININE, SERUM
Creatinine, Ser: 1.52 mg/dL — ABNORMAL HIGH (ref 0.50–1.35)
GFR calc Af Amer: 47 mL/min — ABNORMAL LOW (ref >90)
GFR calc non Af Amer: 41 mL/min — ABNORMAL LOW (ref >90)

## 2013-10-16 LAB — URINE MICROSCOPIC-ADD ON

## 2013-10-16 LAB — PRO B NATRIURETIC PEPTIDE: Pro B Natriuretic peptide (BNP): 3137 pg/mL — ABNORMAL HIGH (ref 0–450)

## 2013-10-16 LAB — TRIGLYCERIDES: TRIGLYCERIDES: 76 mg/dL (ref <150)

## 2013-10-16 LAB — I-STAT CG4 LACTIC ACID, ED: LACTIC ACID, VENOUS: 2.4 mmol/L — AB (ref 0.5–2.2)

## 2013-10-16 LAB — LACTIC ACID, PLASMA: Lactic Acid, Venous: 2.9 mmol/L — ABNORMAL HIGH (ref 0.5–2.2)

## 2013-10-16 MED ORDER — IPRATROPIUM-ALBUTEROL 0.5-2.5 (3) MG/3ML IN SOLN
3.0000 mL | Freq: Four times a day (QID) | RESPIRATORY_TRACT | Status: DC
  Administered 2013-10-16 – 2013-10-17 (×4): 3 mL via RESPIRATORY_TRACT
  Filled 2013-10-16 (×4): qty 3

## 2013-10-16 MED ORDER — INSULIN ASPART 100 UNIT/ML ~~LOC~~ SOLN
1.0000 [IU] | SUBCUTANEOUS | Status: DC
  Administered 2013-10-16 (×2): 1 [IU] via SUBCUTANEOUS

## 2013-10-16 MED ORDER — RAMIPRIL 10 MG PO CAPS
10.0000 mg | ORAL_CAPSULE | Freq: Every day | ORAL | Status: DC
  Filled 2013-10-16: qty 1

## 2013-10-16 MED ORDER — SODIUM CHLORIDE 0.9 % IV SOLN: 1250.0000 mg | INTRAVENOUS | Status: DC

## 2013-10-16 MED ORDER — BUDESONIDE 0.25 MG/2ML IN SUSP
0.2500 mg | Freq: Four times a day (QID) | RESPIRATORY_TRACT | Status: DC
  Administered 2013-10-16 – 2013-10-17 (×4): 0.25 mg via RESPIRATORY_TRACT
  Filled 2013-10-16 (×8): qty 2

## 2013-10-16 MED ORDER — ACETAMINOPHEN 325 MG PO TABS
650.0000 mg | ORAL_TABLET | ORAL | Status: DC | PRN
  Administered 2013-10-16 – 2013-10-18 (×2): 650 mg via ORAL
  Filled 2013-10-16 (×2): qty 2

## 2013-10-16 MED ORDER — WARFARIN SODIUM 5 MG PO TABS
5.0000 mg | ORAL_TABLET | Freq: Once | ORAL | Status: AC
  Administered 2013-10-16: 5 mg via ORAL
  Filled 2013-10-16: qty 1

## 2013-10-16 MED ORDER — SODIUM CHLORIDE 0.9 % IV SOLN: 1000.0000 mL | INTRAVENOUS | Status: DC

## 2013-10-16 MED ORDER — NEBIVOLOL HCL 5 MG PO TABS
5.0000 mg | ORAL_TABLET | Freq: Every day | ORAL | Status: DC
  Administered 2013-10-18: 5 mg via ORAL
  Filled 2013-10-16 (×3): qty 1

## 2013-10-16 MED ORDER — ASPIRIN 81 MG PO CHEW
81.0000 mg | CHEWABLE_TABLET | Freq: Every day | ORAL | Status: DC
  Administered 2013-10-16 – 2013-10-18 (×3): 81 mg via ORAL
  Filled 2013-10-16 (×3): qty 1

## 2013-10-16 MED ORDER — MORPHINE SULFATE 4 MG/ML IJ SOLN
4.0000 mg | Freq: Once | INTRAMUSCULAR | Status: AC
  Administered 2013-10-16: 4 mg via INTRAVENOUS
  Filled 2013-10-16: qty 1

## 2013-10-16 MED ORDER — SODIUM CHLORIDE 0.9 % IV SOLN: 1000.0000 mL | Freq: Once | INTRAVENOUS | Status: DC

## 2013-10-16 MED ORDER — VANCOMYCIN HCL IN DEXTROSE 1-5 GM/200ML-% IV SOLN
1000.0000 mg | Freq: Once | INTRAVENOUS | Status: AC
  Administered 2013-10-16: 1000 mg via INTRAVENOUS
  Filled 2013-10-16: qty 200

## 2013-10-16 MED ORDER — HEPARIN SODIUM (PORCINE) 5000 UNIT/ML IJ SOLN
5000.0000 [IU] | Freq: Three times a day (TID) | INTRAMUSCULAR | Status: DC
  Filled 2013-10-16 (×3): qty 1

## 2013-10-16 MED ORDER — PIPERACILLIN-TAZOBACTAM 3.375 G IVPB 30 MIN
3.3750 g | Freq: Once | INTRAVENOUS | Status: AC
  Administered 2013-10-16: 3.375 g via INTRAVENOUS
  Filled 2013-10-16: qty 50

## 2013-10-16 MED ORDER — LEVOFLOXACIN 500 MG PO TABS
500.0000 mg | ORAL_TABLET | Freq: Every day | ORAL | Status: DC
  Administered 2013-10-17: 500 mg via ORAL
  Filled 2013-10-16 (×2): qty 1

## 2013-10-16 MED ORDER — ACETAMINOPHEN 650 MG RE SUPP
650.0000 mg | RECTAL | Status: DC | PRN
  Administered 2013-10-16: 650 mg via RECTAL
  Filled 2013-10-16: qty 1

## 2013-10-16 MED ORDER — ONDANSETRON HCL 4 MG/2ML IJ SOLN
4.0000 mg | Freq: Once | INTRAMUSCULAR | Status: AC
  Administered 2013-10-16: 4 mg via INTRAVENOUS

## 2013-10-16 MED ORDER — ALBUTEROL SULFATE (2.5 MG/3ML) 0.083% IN NEBU
5.0000 mg | INHALATION_SOLUTION | Freq: Once | RESPIRATORY_TRACT | Status: AC
  Administered 2013-10-16: 5 mg via RESPIRATORY_TRACT
  Filled 2013-10-16: qty 6

## 2013-10-16 MED ORDER — ONDANSETRON HCL 4 MG/2ML IJ SOLN
INTRAMUSCULAR | Status: AC
  Filled 2013-10-16: qty 2

## 2013-10-16 MED ORDER — FUROSEMIDE 10 MG/ML IJ SOLN
40.0000 mg | Freq: Once | INTRAMUSCULAR | Status: AC
  Administered 2013-10-16: 40 mg via INTRAVENOUS
  Filled 2013-10-16: qty 4

## 2013-10-16 MED ORDER — WARFARIN SODIUM 5 MG PO TABS: 5.0000 mg | ORAL_TABLET | ORAL | Status: DC

## 2013-10-16 MED ORDER — WARFARIN - PHARMACIST DOSING INPATIENT: Freq: Every day | Status: DC

## 2013-10-16 MED ORDER — ADULT MULTIVITAMIN W/MINERALS CH
1.0000 | ORAL_TABLET | Freq: Every day | ORAL | Status: DC
  Administered 2013-10-16 – 2013-10-18 (×3): 1 via ORAL
  Filled 2013-10-16 (×3): qty 1

## 2013-10-16 MED ORDER — PROPOFOL 10 MG/ML IV EMUL: 0.0000 ug/kg/min | INTRAVENOUS | Status: DC

## 2013-10-16 MED ORDER — SODIUM CHLORIDE 0.9 % IV SOLN
1000.0000 mL | Freq: Once | INTRAVENOUS | Status: AC
  Administered 2013-10-16: 1000 mL via INTRAVENOUS

## 2013-10-16 MED ORDER — ASPIRIN 81 MG PO TABS: 81.0000 mg | ORAL_TABLET | Freq: Every day | ORAL | Status: DC

## 2013-10-16 MED ORDER — VANCOMYCIN HCL 500 MG IV SOLR
500.0000 mg | Freq: Once | INTRAVENOUS | Status: AC
  Administered 2013-10-16: 500 mg via INTRAVENOUS
  Filled 2013-10-16: qty 500

## 2013-10-16 MED ORDER — IPRATROPIUM-ALBUTEROL 0.5-2.5 (3) MG/3ML IN SOLN
3.0000 mL | Freq: Once | RESPIRATORY_TRACT | Status: AC
  Administered 2013-10-16: 3 mL via RESPIRATORY_TRACT

## 2013-10-16 MED ORDER — PANTOPRAZOLE SODIUM 40 MG IV SOLR: 40.0000 mg | Freq: Every day | INTRAVENOUS | Status: DC

## 2013-10-16 MED ORDER — SIMVASTATIN 20 MG PO TABS
20.0000 mg | ORAL_TABLET | Freq: Every day | ORAL | Status: DC
  Administered 2013-10-16 – 2013-10-17 (×2): 20 mg via ORAL
  Filled 2013-10-16 (×4): qty 1

## 2013-10-16 MED ORDER — SODIUM CHLORIDE 0.9 % IV BOLUS (SEPSIS): 1000.0000 mL | Freq: Once | INTRAVENOUS | Status: DC

## 2013-10-16 MED ORDER — SODIUM CHLORIDE 0.9 % IV SOLN: 250.0000 mL | INTRAVENOUS | Status: DC | PRN

## 2013-10-16 MED ORDER — DEXTROSE 5 % IV SOLN
1.0000 g | Freq: Two times a day (BID) | INTRAVENOUS | Status: DC
  Administered 2013-10-16: 1 g via INTRAVENOUS
  Filled 2013-10-16 (×2): qty 1

## 2013-10-16 MED ORDER — METHYLPREDNISOLONE SODIUM SUCC 40 MG IJ SOLR
40.0000 mg | Freq: Two times a day (BID) | INTRAMUSCULAR | Status: DC
  Administered 2013-10-16 – 2013-10-17 (×2): 40 mg via INTRAVENOUS
  Filled 2013-10-16 (×3): qty 1

## 2013-10-16 MED ORDER — METHYLPREDNISOLONE SODIUM SUCC 40 MG IJ SOLR
40.0000 mg | Freq: Two times a day (BID) | INTRAMUSCULAR | Status: DC
  Administered 2013-10-16: 40 mg via INTRAVENOUS
  Filled 2013-10-16 (×2): qty 1

## 2013-10-16 MED ORDER — LEVOFLOXACIN IN D5W 750 MG/150ML IV SOLN
750.0000 mg | Freq: Every day | INTRAVENOUS | Status: DC
  Administered 2013-10-16: 750 mg via INTRAVENOUS
  Filled 2013-10-16: qty 150

## 2013-10-16 MED ORDER — PANTOPRAZOLE SODIUM 40 MG IV SOLR
40.0000 mg | INTRAVENOUS | Status: DC
  Filled 2013-10-16: qty 40

## 2013-10-16 MED ORDER — NITROGLYCERIN IN D5W 200-5 MCG/ML-% IV SOLN: 40.0000 ug/min | INTRAVENOUS | Status: DC

## 2013-10-16 NOTE — ED Provider Notes (Signed)
CSN: [id number]     Arrival date & time 10/16/13  0330 History   First MD Initiated Contact with Patient 10/16/13 [phone]     Chief Complaint  Patient presents with  . Shortness of Breath     (Consider location/radiation/quality/duration/timing/severity/associated sxs/prior Treatment) HPI Patient is an 80 yo man with diastolic heart failure, CAD, chronic atrial fibrillation and history of HTN, HLD, severe MR, prostate CA and pulmonary hypertension.   He presents from home via EMS with SOB. The patient has had a productive cough for the past few days, was seen by his PCP yesterday, had an outpatient chest xray and was started on Levaquin po. He says he feels worse. His temp shortly pta was 101.2 and is 102.2 in the ED. Sats at home were 88% on RA, per EMS.   Limited history obtained due to degree of respiratory distress.   Past Medical History  Diagnosis Date  . Prostate cancer   . CAD (coronary artery disease)     Circumflex MI 1995, Last cath 2008  . Mitral regurgitation     Severe  . HLD (hyperlipidemia)   . HTN (hypertension)   . Atrial fibrillation   . Pulmonary hypertension   . Presence of permanent cardiac pacemaker 10/16/2006,09/04/12    Medtronic adapta  . Chronic anticoagulation    Past Surgical History  Procedure Laterality Date  . Permanent pacemaker insertion  10/16/2006    Medtronic Enrhythm  . Permanent pacemaker generator change  09/04/2012    Medtronic adapta  . Coronary angioplasty  09/16/1993    100% CX-successful  . Cardiac catheterization  05/11/2002    70-80% small first diagonal,50% RCA  . Cardiac catheterization  10/14/2006    multivessell,EF 40-50%  . Nm myoview ltd  08/30/2009    mod. perfusion defect-mid inferior,apical inferior,basal inferolateral,mid inferolateral & apical lateral regions  . US echocardiography  03/27/2012    severe MR persists w/progressive LA dilatation,RSVP has increased & AS has progressed   Family History  Problem Relation Age of  Onset  . Diabetes Sister    History  Substance Use Topics  . Smoking status: Never Smoker   . Smokeless tobacco: Never Used  . Alcohol Use: No    Review of Systems  Unable to obtain ROS due to acute respiratory distress. Patient denies chest pain.     Allergies  Codeine  Home Medications   Prior to Admission medications   Medication Sig Start Date End Date Taking? Authorizing Provider  aspirin 81 MG tablet Take 81 mg by mouth daily.    Historical Provider, MD  furosemide (LASIX) 20 MG tablet Take 1 tablet (20 mg total) by mouth daily as needed. 07/27/13   Troy Sine, MD  levofloxacin (LEVAQUIN) 500 MG tablet Take 1 tablet (500 mg total) by mouth daily. 10/15/13   Troy Sine, MD  Multiple Vitamin (MULTIVITAMIN WITH MINERALS) TABS Take 1 tablet by mouth daily.    Historical Provider, MD  nebivolol (BYSTOLIC) 5 MG tablet Take 5 mg by mouth daily.    Historical Provider, MD  ramipril (ALTACE) 10 MG capsule Take 1 capsule (10 mg total) by mouth daily. 12/24/12   Mihai Croitoru, MD  simvastatin (ZOCOR) 20 MG tablet Take 1 tablet (20 mg total) by mouth at bedtime. 07/01/13   Troy Sine, MD  warfarin (COUMADIN) 5 MG tablet TAKE 1 TABLET BY MOUTH DAILY OR AS DIRECTED 07/17/13   Troy Sine, MD   SpO2 89% Physical Exam  Gen:  well developed and well nourished appearing, acutely ill-appearing Head: NCAT Eyes: PERL, EOMI Nose: no epistaixis or rhinorrhea Mouth/throat: mucosa is moist and pink Neck: supple, no stridor Lungs: Diffuse wheezing, breath sounds diminished at both bases, no rhonchi or rales appreciated, respiratory rate is in the 30s with accessory muscle use and retractions.  CV: RRR, no murmur, extremities appear well perfused.  Abd: soft, notender, nondistended Back: no ttp, no cva ttp Skin: warm and dry Ext: normal to inspection, no dependent edema Neuro: CN ii-xii grossly intact, moving all 4 extremities with apparently equal strength,  no focal  deficits Psyche; mildly agitated affect,  cooperative  ED Course  Procedures (including critical care time) Labs Review  Results for orders placed during the hospital encounter of 10/16/13 (from the past 24 hour(s))  COMPREHENSIVE METABOLIC PANEL     Status: Abnormal   Collection Time    10/16/13  3:45 AM      Result Value Ref Range   Sodium 136 (*) 137 - 147 mEq/L   Potassium 4.2  3.7 - 5.3 mEq/L   Chloride 94 (*) 96 - 112 mEq/L   CO2 26  19 - 32 mEq/L   Glucose, Bld 144 (*) 70 - 99 mg/dL   BUN 20  6 - 23 mg/dL   Creatinine, Ser 1.12  0.50 - 1.35 mg/dL   Calcium 9.4  8.4 - 10.5 mg/dL   Total Protein 7.7  6.0 - 8.3 g/dL   Albumin 4.2  3.5 - 5.2 g/dL   AST 20  0 - 37 U/L   ALT 12  0 - 53 U/L   Alkaline Phosphatase 62  39 - 117 U/L   Total Bilirubin 1.8 (*) 0.3 - 1.2 mg/dL   GFR calc non Af Amer 59 (*) >90 mL/min   GFR calc Af Amer 69 (*) >90 mL/min  CBC     Status: Abnormal   Collection Time    10/16/13  3:45 AM      Result Value Ref Range   WBC 14.5 (*) 4.0 - 10.5 K/uL   RBC 4.73  4.22 - 5.81 MIL/uL   Hemoglobin 15.8  13.0 - 17.0 g/dL   HCT 44.2  39.0 - 52.0 %   MCV 93.4  78.0 - 100.0 fL   MCH 33.4  26.0 - 34.0 pg   MCHC 35.7  30.0 - 36.0 g/dL   RDW 14.5  11.5 - 15.5 %   Platelets 174  150 - 400 K/uL  I-STAT TROPOININ, ED     Status: None   Collection Time    10/16/13  3:48 AM      Result Value Ref Range   Troponin i, poc 0.01  0.00 - 0.08 ng/mL   Comment 3           I-STAT CG4 LACTIC ACID, ED     Status: Abnormal   Collection Time    10/16/13  3:58 AM      Result Value Ref Range   Lactic Acid, Venous 2.40 (*) 0.5 - 2.2 mmol/L  I-STAT VENOUS BLOOD GAS, ED     Status: Abnormal   Collection Time    10/16/13  4:23 AM      Result Value Ref Range   pH, Ven 7.232 (*) 7.250 - 7.300   pCO2, Ven 63.8 (*) 45.0 - 50.0 mmHg   pO2, Ven 21.0 (*) 30.0 - 45.0 mmHg   Bicarbonate 26.2 (*) 20.0 - 24.0 mEq/L   TCO2 28  0 -  100 mmol/L   O2 Saturation 21.0     Acid-base  deficit 2.0  0.0 - 2.0 mmol/L   Patient temperature 102.0 F     Sample type VENOUS     Comment NOTIFIED PHYSICIAN      Imaging Review Dg Chest 2 View  10/15/2013   CLINICAL DATA:  Cough, shortness of breath  EXAM: CHEST  2 VIEW  COMPARISON:  DG CHEST 2 VIEW dated 09/04/2012; DG CHEST 2 VIEW dated 10/17/2006; DG CHEST 2 VIEW dated 10/07/2006  FINDINGS: Grossly unchanged enlarged cardiac silhouette and mediastinal contours with prominence of the central pulmonary arteries. Stable positioning of support apparatus. The lungs remain hyperexpanded with flattening of the bilateral diaphragms and mild diffuse slightly nodular thickening of the pulmonary interstitium. There is minimal pleural parenchymal thickening about the right minor and the bilateral major fissures. No focal airspace opacities. No pleural effusion or pneumothorax. No evidence of edema. Unchanged bones including mild (< 25%) compression deformity of a lower thoracic vertebral body. Post cholecystectomy.  IMPRESSION: 1. Hyperexpanded lungs and bronchitic change without acute cardiopulmonary disease. 2. Cardiomegaly and prominence of the central pulmonary arteries, nonspecific though could be seen in the setting of pulmonary arterial hypertension. Further evaluation cardiac echo could be performed as clinically indicated.   Electronically Signed   By: Sandi Mariscal M.D.   On: 10/15/2013 16:32   EKG: nsr, RBBB, widened QRS, no acute ischemic changes, right axis deviation, no acute ischemic changes identified, no significant change since Nov 2014.   CRITICAL CARE Performed by: Elyn Peers   Total critical care time:39m  Critical care time was exclusive of separately billable procedures and treating other patients.  Critical care was necessary to treat or prevent imminent or life-threatening deterioration.  Critical care was time spent personally by me on the following activities: development of treatment plan with patient and/or surrogate as  well as nursing, discussions with consultants, evaluation of patient's response to treatment, examination of patient, obtaining history from patient or surrogate, ordering and performing treatments and interventions, ordering and review of laboratory studies, ordering and review of radiographic studies, pulse oximetry and re-evaluation of patient's condition.   MDM    Patient with acute respiratory distress/failure. CXR does not show any focal consolidation and is consistent with some mild pulmonary edema. Patient is febrile with elevated WBC and mildly elevated lactic acid. Patient is at high risk for PE, due to pulmonary hypertension, and this is also in the diagnosis. The patient remains quite tachypneic despite tx with MS 4mg  for air hunger related anxiety and BiPAP.  Anticipate ICU v. SDU admission.   [id number]: Patient is more alert and in much less distress on BiPAP. RR has decreased to approximately 28/min on BiPAP and respiratory acidosis is resolving per repeat VBG.  Patient evaluated by CCM service and will be admitted to the ICU.   Elyn Peers, MD 10/16/13 [phone]

## 2013-10-16 NOTE — Progress Notes (Signed)
Patient ID: Norman Lee, male   DOB: [birth year], 80 y.o.   MRN: [id number]      HPI: Norman Lee is a 80 y.o. male [place] today for a six-month cardiology evaluation.  Chief complaint today is that of some increasing cough, purulent sputum, and some shortness of breath.  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 He also has sick sinus syndrome as well as permanent atrial fibrillation and is status post single-chamber pacemaker implantation initially in May 2008 and in April 2014 he underwent a generator change with a current device being a Medtronic Adapta ADD RL1 device. The patient's last cardiac catheterization was in 2008 which showed progressive 70% eccentric mid right artery stenosis. Circumflex intervention sites were patent although he had residual disease 20-30% proximally the marginal vessel had 50-60% in the AV groove. It 30% LAD stenosis. 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 He has developed severe mitral regurgitation most likely due to a flail leaflet of the middle scallop of the posterior limb leaflet. We have discussed potential valve repair but ultimately he has refused this. I had seen him in March we had discussed about the possibility of cardiac catheterization for further evaluation. He initially had decided against this but then when I last saw him in October 2014, he was considering the possibility of undergoing a repeat cardiac catheterization and possible evaluation for mitral valve surgery.  The right and left heart catheterization was tentatively scheduled, but he subsequently canceled this and it was not perform.  He  has a history of prostate CA apparently he underwent cryoablation 2011 by Dr. Gaynelle Arabian but his PSAs  have been slowly rising again. He presents now for evaluation.  Past several weeks, he does admit to increased cough, development of at times greenish sputum, and possibly low-grade temperature.  He had his routine cardiology appointment scheduled for today and presents for evaluation.  Past Medical History  Diagnosis Date  . Prostate cancer   . CAD (coronary artery disease)     Circumflex MI 1995, Last cath 2008  . Mitral regurgitation     Severe  . HLD (hyperlipidemia)   . HTN (hypertension)   . Atrial fibrillation   . Pulmonary hypertension   . Presence of permanent cardiac pacemaker 10/16/2006,09/04/12    Medtronic adapta  . Chronic anticoagulation     Past Surgical History  Procedure Laterality Date  . Permanent pacemaker insertion  10/16/2006    Medtronic Enrhythm  . Permanent pacemaker generator change  09/04/2012    Medtronic adapta  . Coronary angioplasty  09/16/1993    100% CX-successful  . Cardiac catheterization  05/11/2002    70-80% small first diagonal,50% RCA  . Cardiac catheterization  10/14/2006    multivessell,EF 40-50%  . Nm myoview ltd  08/30/2009    mod. perfusion defect-mid inferior,apical inferior,basal inferolateral,mid inferolateral & apical lateral regions  . US echocardiography  03/27/2012    severe MR persists w/progressive LA dilatation,RSVP has increased & AS has progressed    Allergies  Allergen Reactions  . Codeine Nausea And Vomiting    No current facility-administered medications for this visit.   No current outpatient prescriptions on file.   Facility-Administered Medications Ordered in Other Visits  Medication Dose Route Frequency Provider Last Rate Last Dose  .  0.9 %  sodium chloride infusion  250 mL Intravenous PRN Mariea Clonts, MD   250 mL at 10/16/13 0856  . acetaminophen (TYLENOL) tablet 650 mg  650 mg Oral Q4H PRN Wilhelmina Mcardle, MD      . aspirin chewable tablet 81 mg  81 mg Oral Daily Colbert Coyer, MD   81 mg at  10/16/13 0900  . budesonide (PULMICORT) nebulizer solution 0.25 mg  0.25 mg Nebulization QID Wilhelmina Mcardle, MD   0.25 mg at 10/16/13 1208  . ipratropium-albuterol (DUONEB) 0.5-2.5 (3) MG/3ML nebulizer solution 3 mL  3 mL Nebulization QID Wilhelmina Mcardle, MD   3 mL at 10/16/13 1208  . [START ON 10/17/2013] levofloxacin (LEVAQUIN) tablet 500 mg  500 mg Oral Daily Wilhelmina Mcardle, MD      . methylPREDNISolone sodium succinate (SOLU-MEDROL) 40 mg/mL injection 40 mg  40 mg Intravenous Q12H Wilhelmina Mcardle, MD   40 mg at 10/16/13 1400  . multivitamin with minerals tablet 1 tablet  1 tablet Oral Daily Mariea Clonts, MD   1 tablet at 10/16/13 0900  . nebivolol (BYSTOLIC) tablet 5 mg  5 mg Oral Daily Mariea Clonts, MD      . simvastatin (ZOCOR) tablet 20 mg  20 mg Oral q1800 Mariea Clonts, MD      . sodium chloride 0.9 % bolus 1,000 mL  1,000 mL Intravenous Once Mariea Clonts, MD      . warfarin (COUMADIN) tablet 5 mg  5 mg Oral ONCE-1800 Colbert Coyer, MD      . Warfarin - Pharmacist Dosing Inpatient   Does not apply [id number] Colbert Coyer, MD        History   Social History  . Marital Status: Married    Spouse Name: N/A    Number of Children: N/A  . Years of Education: N/A   Occupational History  . Not on file.   Social History Main Topics  . Smoking status: Never Smoker   . Smokeless tobacco: Never Used  . Alcohol Use: No  . Drug Use: Not on file  . Sexual Activity: Not on file   Other Topics Concern  . Not on file   Social History Narrative  . No narrative on file    Family History  Problem Relation Age of Onset  . Diabetes Sister    ROS  General: positive fevers, chills, no night sweats;  HEENT: Decreased hearing; No changes in vision, , difficulty swallowing  Pulmonary: positive for cough, wheezing, shortness of breath, no hemoptysis  Cardiovascular: Positive fior shortness of breath ; No chest pain, presyncope, syncope, palpatations  GI: Negative;  No nausea, vomiting, diarrhea, or abdominal pain  GU: Negative; No dysuria, hematuria, or difficulty voiding  Musculoskeletal: Negative; no myalgias, joint pain, or weakness  Hematologic/Oncology: Positive for prostate Ca; no easy bruising, bleeding  Endocrine: Negative; no heat/cold intolerance  Neuro: Negative; no changes in balance, headaches  Skin: Negative; No rashes or skin lesions  Psychiatric: Negative; No behavioral problems, depression  Sleep: Negative; No snoring, daytime sleepiness, hypersomnolence, bruxism, restless legs, hypnogognic hallucinations, no cataplexy  Other comprehensive 14 review of systems was negative    PE BP 128/78  Pulse 72  Ht 6\' 2"  (1.88 m)  Wt 173 lb 4.8 oz (78.608 kg)  BMI 22.24 kg/m2  General: Alert, oriented, no distress.  Skin: normal turgor, no rashes HEENT: Normocephalic, atraumatic. Pupils round and reactive; sclera anicteric;no lid lag.  Nose without nasal septal hypertrophy Mouth/Parynx benign; Mallinpatti scale 3 Chest wall: Nontender to palpation Neck: No JVD, no carotid bruits Lungs: Diffuse rhonchi R>>L Heart: Irregularly irregular rhythm with controlled ventricular rate proximally 70, s1 s2 normal 8-4/6 holosystolic murmur at the apex radiating to the axilla concordant with his severe mitral regurgitation. Abdomen: soft, nontender; no hepatosplenomehaly, BS+; abdominal aorta nontender and not dilated by palpation. Back: No CVA tenderness Pulses 2+ Extremities: no clubbing cyanosis or edema, Homan's sign negative  Neurologic: grossly nonfocal Psychological: Intact mood and affect  ECG  (independently read by me): Ventricular paced rhythm with underlying AF  LABS:  BMET    Component Value Date/Time   NA 136* 10/16/2013 0345   K 4.2 10/16/2013 0345   CL 94* 10/16/2013 0345   CO2 26 10/16/2013 0345   GLUCOSE 144* 10/16/2013 0345   BUN 20 10/16/2013 0345   CREATININE 1.52* 10/16/2013 0832   CREATININE 1.23 10/28/2012 1135   CALCIUM 9.4  10/16/2013 0345   GFRNONAA 41* 10/16/2013 0832   GFRAA 47* 10/16/2013 0832     Hepatic Function Panel     Component Value Date/Time   PROT 7.7 10/16/2013 0345   ALBUMIN 4.2 10/16/2013 0345   AST 20 10/16/2013 0345   ALT 12 10/16/2013 0345   ALKPHOS 62 10/16/2013 0345   BILITOT 1.8* 10/16/2013 0345   BILIDIR 0.2 10/28/2012 1135   IBILI 0.9 10/28/2012 1135     CBC    Component Value Date/Time   WBC 10.6* 10/16/2013 0832   RBC 4.12* 10/16/2013 0832   HGB 13.3 10/16/2013 0832   HCT 38.6* 10/16/2013 0832   PLT 153 10/16/2013 0832   MCV 93.7 10/16/2013 0832   MCH 32.3 10/16/2013 0832   MCHC 34.5 10/16/2013 0832   RDW 14.8 10/16/2013 0832     BNP    Component Value Date/Time   PROBNP 3137.0* 10/16/2013 0345    Lipid Panel     Component Value Date/Time   CHOL 141 10/28/2012 1135   TRIG 76 10/16/2013 0519   HDL 48 10/28/2012 1135   CHOLHDL 2.9 10/28/2012 1135   VLDL 21 10/28/2012 1135   LDLCALC 72 10/28/2012 1135     RADIOLOGY: No results found.    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 He is documented upper lateral wall scar from this event. His last cardiac catheterization was 2008 . His last nuclear perfusion study was March 2011 which showed his old left circumflex inferior to inferolateral scar without associated ischemia. He is status post pacemaker generator change secondary to previous end of life on his battery. He does have severe mitral regurgitation most likely due to a flail leaflet. He has previously decided against consideration for mitral valve repair. He is followed by Dr. Gaynelle Arabian for his rising PSA levels and is status post cryoablation in 2011 4 prostate CA.Marland Kitchen  His recent INR was 2.2.  He now presents with several weeks of increasing cough and congestion.  Physical examination demonstrates significant rhonchi, much greater on the  right when compared to the left.  I have recommended he undergo a PA and lateral chest x-ray today.  I'm also giving him a prescription for Levaquin 500 mg daily.  He will need to have her pro time rechecked in approximately 3 days to make certain this is not climbing potential Levaquin, Coumadin interaction.  He  was advised to see his primary physician for followup evaluation.  If he does continue to drink increasing episodes of cough, congestion, or fever.  He may require hospitalization.  At present, he is still against consideration of further treatment of his severe mitral regurgitation is intubated by a partial flail posterior leaflet and prior left circumflex myocardial infarction.    Troy Sine, MD, [facility]  10/16/2013 4:15 PM

## 2013-10-16 NOTE — Progress Notes (Signed)
ANTIBIOTIC CONSULT NOTE - INITIAL  Pharmacy Consult for Vancomycin/Cefepime/Levaquin Indication: rule out pneumonia  Allergies  Allergen Reactions  . Codeine Nausea And Vomiting    Patient Measurements: Height: 6\' 2"  (188 cm) Weight: 174 lb (78.926 kg) IBW/kg (Calculated) : 82.2  Vital Signs: Temp: 99.7 F (37.6 C) (05/02 0645) Temp src: Rectal (05/02 0645) BP: 94/54 mmHg (05/02 0732) Pulse Rate: 70 (05/02 0715) Intake/Output from previous day: 05/01 0701 - 05/02 0700 In: 250 [I.V.:250] Out: -  Intake/Output from this shift:    Labs:  Recent Labs  10/16/13 0345  WBC 14.5*  HGB 15.8  PLT 174  CREATININE 1.12   Estimated Creatinine Clearance: 56.7 ml/min (by C-G formula based on Cr of 1.12). No results found for this basename: VANCOTROUGH, VANCOPEAK, VANCORANDOM, GENTTROUGH, GENTPEAK, GENTRANDOM, TOBRATROUGH, TOBRAPEAK, TOBRARND, AMIKACINPEAK, AMIKACINTROU, AMIKACIN,  in the last 72 hours   Microbiology: No results found for this or any previous visit (from the past 720 hour(s)).  Medical History: Past Medical History  Diagnosis Date  . Prostate cancer   . CAD (coronary artery disease)     Circumflex MI 1995, Last cath 2008  . Mitral regurgitation     Severe  . HLD (hyperlipidemia)   . HTN (hypertension)   . Atrial fibrillation   . Pulmonary hypertension   . Presence of permanent cardiac pacemaker 10/16/2006,09/04/12    Medtronic adapta  . Chronic anticoagulation    Assessment: 80 yo male with SOB/PNA for empiric antibiotics.  Vancomycin 1 g IV given in ED at 0400  Goal of Therapy:  Vancomycin trough level 15-20 mcg/ml  Plan:  Vancomycin 500 mg IV now for total of 1500 mg today, then 1250 mg IV q24h Cefepime 1 g IV q12h Levaquin 750 mg IV q24h  Bronson Curb Justise Ehmann 10/16/2013,7:36 AM

## 2013-10-16 NOTE — Progress Notes (Signed)
Placed patient on BIPAP due to respiratory distress and code sepsis. Placed patient on the following settings 13  IPAP and 6 EPAP with fi02 at 40%

## 2013-10-16 NOTE — ED Notes (Signed)
Wife's Cell: [phone]

## 2013-10-16 NOTE — ED Notes (Signed)
Admitting MD at BS.  

## 2013-10-16 NOTE — Progress Notes (Signed)
  Echocardiogram 2D Echocardiogram has been performed.  [place] 10/16/2013, 5:22 PM

## 2013-10-16 NOTE — ED Notes (Signed)
Dr Cheri Guppy given a copy of lactic acid results 2.40

## 2013-10-16 NOTE — ED Notes (Signed)
Spoke with CC, aware of pt's BP.

## 2013-10-16 NOTE — Progress Notes (Signed)
Placed patient on BIPAP 13/6 with oxygen at 40%.

## 2013-10-16 NOTE — ED Notes (Signed)
Transporter attempted to bring pt to CT @ 0550 Patient unstable RN Jacqlyn Larsen will call when ready for CT

## 2013-10-16 NOTE — ED Notes (Signed)
MD at bedside. 

## 2013-10-16 NOTE — ED Notes (Signed)
Verbal order from critical care to discontinue verbal order to bolus 250cc of NS r/t pt's BP. CC aware of pt's BP.

## 2013-10-16 NOTE — H&P (Signed)
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 PCCM asked to admit.  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 The SOB began approximately 1 week ago and has increased in severity. It is preventing him from sleeping. He has noted associated fevers and a cough productive of "gobs" of yellowish sputum. He denies associated chest pain, N/V or diarrhea. He presented to his PCP on 5/1 who prescribed Levaquin.  PAST MEDICAL HISTORY :  Past Medical History  Diagnosis Date  . Prostate cancer   . CAD (coronary artery disease)     Circumflex MI 1995, Last cath 2008  . Mitral regurgitation     Severe  . HLD (hyperlipidemia)   . HTN (hypertension)   . Atrial fibrillation   . Pulmonary hypertension   . Presence of permanent cardiac pacemaker 10/16/2006,09/04/12    Medtronic adapta  . Chronic anticoagulation    Past Surgical History  Procedure Laterality Date  . Permanent pacemaker insertion  10/16/2006    Medtronic Enrhythm  . Permanent pacemaker generator change  09/04/2012    Medtronic adapta  . Coronary angioplasty  09/16/1993    100% CX-successful  . Cardiac catheterization  05/11/2002    70-80% small first diagonal,50% RCA  . Cardiac catheterization  10/14/2006    multivessell,EF 40-50%  . Nm myoview ltd  08/30/2009    mod. perfusion defect-mid inferior,apical inferior,basal inferolateral,mid inferolateral & apical lateral regions   . US echocardiography  03/27/2012    severe MR persists w/progressive LA dilatation,RSVP has increased & AS has progressed   Prior to Admission medications   Medication Sig Start Date End Date Taking? Authorizing Provider  aspirin 81 MG tablet Take 81 mg by mouth daily.   Yes Historical Provider, MD  furosemide (LASIX) 20 MG tablet Take 20 mg by mouth daily as needed for fluid.   Yes Historical Provider, MD  levofloxacin (LEVAQUIN) 500 MG tablet Take 1 tablet (500 mg total) by mouth daily. 10/15/13  Yes Troy Sine, MD  Multiple Vitamin (MULTIVITAMIN WITH MINERALS) TABS Take 1 tablet by mouth daily.   Yes Historical Provider, MD  nebivolol (BYSTOLIC) 5 MG tablet Take 5 mg by mouth daily.   Yes Historical Provider, MD  ramipril (ALTACE) 10 MG capsule Take 1 capsule (10 mg total) by mouth daily. 12/24/12  Yes Mihai Croitoru, MD  simvastatin (ZOCOR) 20 MG tablet Take 20 mg by mouth every morning.   Yes Historical Provider, MD  warfarin (COUMADIN) 5 MG tablet Take 5 mg by mouth every morning. 2.5mg  Lee, Wednesday, and Friday.  5mg  all other days   Yes Historical Provider, MD   Allergies  Allergen Reactions  . Codeine Nausea And Vomiting    FAMILY HISTORY:  Family History  Problem Relation Age of Onset  . Diabetes Sister    SOCIAL HISTORY:  reports that he has never smoked. He has never  used smokeless tobacco. He reports that he does not drink alcohol. His drug history is not on file.  REVIEW OF SYSTEMS:  A 12-system ROS was conducted and, unless otherwise specified in the HPI, was negative.   SUBJECTIVE:   VITAL SIGNS: Temp:  [102 F (38.9 C)-102.1 F (38.9 C)] 102 F (38.9 C) (05/02 0416) Pulse Rate:  [70-90] 72 (05/02 0700) Resp:  [17-39] 18 (05/02 0700) BP: (80-199)/(38-109) 87/54 mmHg (05/02 0700) SpO2:  [89 %-100 %] 99 % (05/02 0700) FiO2 (%):  [40 %] 40 % (05/02 0414) Weight:  [173 lb 4.8 oz (78.608 kg)-174 lb (78.926 kg)] 174 lb (78.926 kg) (05/02  0336) HEMODYNAMICS:   VENTILATOR SETTINGS: Vent Mode:  [-]  FiO2 (%):  [40 %] 40 % INTAKE / OUTPUT: Intake/Output     05/01 0701 - 05/02 0700 05/02 0701 - 05/03 0700   I.V. (mL/kg) 250 (3.2)    Total Intake(mL/kg) 250 (3.2)    Net +250            PHYSICAL EXAMINATION: General:  WDWN elderly M in NAD Neuro:  A+O x 3; moves all extremities HEENT:  Sclera anicteric, conjunctiva pink, MMM, OP clear Neck: Trachea supple and midline, (-) LAN or JVD Cardiovascular:  RRR, NS1/S2, (-) MRG Lungs:  Diffuse wheezing at bases Abdomen:  S/NT/ND/(+)BS Musculoskeletal:  (-) C/C/E Skin:  Intact  LABS:  CBC  Recent Labs Lab 10/16/13 0345  WBC 14.5*  HGB 15.8  HCT 44.2  PLT 174   Coag's  Recent Labs Lab 10/16/13 0415  INR 1.74*   BMET  Recent Labs Lab 10/16/13 0345  NA 136*  K 4.2  CL 94*  CO2 26  BUN 20  CREATININE 1.12  GLUCOSE 144*   Electrolytes  Recent Labs Lab 10/16/13 0345  CALCIUM 9.4   Sepsis Markers  Recent Labs Lab 10/16/13 0358  LATICACIDVEN 2.40*   ABG No results found for this basename: PHART, PCO2ART, PO2ART,  in the last 168 hours Liver Enzymes  Recent Labs Lab 10/16/13 0345  AST 20  ALT 12  ALKPHOS 62  BILITOT 1.8*  ALBUMIN 4.2   Cardiac Enzymes  Recent Labs Lab 10/16/13 0345  PROBNP 3137.0*   Glucose No results found for this basename: GLUCAP,  in the last 168 hours  Imaging Dg Chest 2 View  10/15/2013   CLINICAL DATA:  Cough, shortness of breath  EXAM: CHEST  2 VIEW  COMPARISON:  DG CHEST 2 VIEW dated 09/04/2012; DG CHEST 2 VIEW dated 10/17/2006; DG CHEST 2 VIEW dated 10/07/2006  FINDINGS: Grossly unchanged enlarged cardiac silhouette and mediastinal contours with prominence of the central pulmonary arteries. Stable positioning of support apparatus. The lungs remain hyperexpanded with flattening of the bilateral diaphragms and mild diffuse slightly nodular thickening of the pulmonary interstitium. There is minimal pleural  parenchymal thickening about the right minor and the bilateral major fissures. No focal airspace opacities. No pleural effusion or pneumothorax. No evidence of edema. Unchanged bones including mild (< 25%) compression deformity of a lower thoracic vertebral body. Post cholecystectomy.  IMPRESSION: 1. Hyperexpanded lungs and bronchitic change without acute cardiopulmonary disease. 2. Cardiomegaly and prominence of the central pulmonary arteries, nonspecific though could be seen in the setting of pulmonary arterial hypertension. Further evaluation cardiac echo could be performed as clinically indicated.   Electronically Signed   By: Sandi Mariscal M.D.   On: 10/15/2013 16:32   Dg Chest Port 1 View  10/16/2013   CLINICAL DATA:  Shortness of  breath  EXAM: PORTABLE CHEST - 1 VIEW  COMPARISON:  DG CHEST 2 VIEW dated 10/15/2013  FINDINGS: Cardiac enlargement is more prominent than on prior study. Developing interstitial changes in the lungs suggesting possible interstitial edema. Pulmonary vascularity is normal. No blunting of costophrenic angles. No focal consolidation. Cardiac pacemaker.  IMPRESSION: Enlarging heart size with developing interstitial changes in the lungs, possibly due to edema.   Electronically Signed   By: Lucienne Capers M.D.   On: 10/16/2013 04:09    EKG: Paced CXR: Reviewed. CM with peripheral opacities.  ASSESSMENT / PLAN:  PULMONARY A: Acute Hypoxic Respiratory Failure: The DDx includes pneumonia and heart failure; which are likely coexistent. PE is possible as well.  P:   Supplemental O2/BiPAP as needed to maintain O2 sat of >= 92% F/u PE protocol ordered by ED  CARDIOVASCULAR A: Severe Sepsis: LIkely with pneumonia as the source. Make SIRS criteria with fever and white count. Some hypotension noted in the ED. CAD AFib P:   IVF with goal of 2 L Careful monitoring of Respiratory Status Serial Troponins/Lactates Cont Warfarin  RENAL A: CKD: Stable P:    Monitor  GASTROINTESTINAL A: No Acute Issues  HEMATOLOGIC A: No Acute Issues  INFECTIOUS A: Pneumonia:  P:   HCAP Rx with Vanc, Cefepime, and Levaquin  ENDOCRINE A: Hyperglycemia: Likely 2/2 sepsis P:   SSI  NEUROLOGIC A: No acute issues  BEST PRACTICE / DISPOSITION Level of Care:  ICU Primary Service:  PCCM Consultants:  None Code Status:  Full Diet:  NPO while on BiPAP DVT Px:  On Wafranin GI Px:  Not indicated Skin Integrity:  Intact Social / Family:  Updated at bedside  TODAY'S SUMMARY:   I have personally obtained a history, examined the patient, evaluated laboratory and imaging results, formulated the assessment and plan and placed orders.  CRITICAL CARE: The patient is critically ill with multiple organ systems failure and requires high complexity decision making for assessment and support, frequent evaluation and titration of therapies, application of advanced monitoring technologies and extensive interpretation of multiple databases. Critical Care Time devoted to patient care services described in this note is 30 minutes.   Margarette Asal, MD Pulmonary and [place] Pager: [phone]   10/16/2013, 7:08 AM

## 2013-10-16 NOTE — Consult Note (Signed)
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 He suffered a large left circumflex myocardial infarction in 1995 at which time I was able to perform successful PTCA of a circumflex vessel with late reperfusion ( [place] He also has sick sinus syndrome as well as permanent atrial fibrillation and is status post single-chamber pacemaker implantation initially in May 2008 and in April 2014 he underwent a generator change with a current device being a Medtronic Adapta ADD RL1 device. The patient's last cardiac catheterization was in 2008 which showed progressive 70% eccentric mid right artery stenosis. Circumflex PTCA intervention sites were patent although he had residual disease 20-30% proximally the marginal vessel had 50-60% in the AV groove and  30% LAD stenosis. 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 He has developed severe mitral regurgitation most likely due to a flail leaflet of the middle scallop of the posterior  leaflet. In the past we have discussed potential valve repair but ultimately he has refused this. I had scheduled him for a cardiac catheterization after further discussion last fall, but he ultimately decided against this and cancelled the R and L heart cath. . He also has a history of prostate CA apparently he underwent cryoablation 2011 by Dr. Gaynelle Arabian but his PSAs have been slowly rising again and have been in the 13 - 20 range.   I saw him in the office yesterday and he complained of purulent sputum, cough and more sob. He had diffuse rhonchi R>L on exam, and I recommended a PA/Lat CXR and started him on levoquin with plan for him to f/u  with primary md and re-check INR since on coumadin.   Events of evening reviewed in Epic. He now is breathing much better with Bi-PAP last night. CXR reviewed; cadiomegaly is old. No consolidation. Troponin is mildly positive and more reflective of component of CHF.     Past Medical History  Diagnosis Date  . Prostate cancer   . CAD (coronary artery disease)     Circumflex MI 1995, Last cath 2008  . Mitral regurgitation     Severe  . HLD (hyperlipidemia)   . HTN (hypertension)   . Atrial fibrillation   . Pulmonary hypertension   . Presence of permanent cardiac pacemaker 10/16/2006,09/04/12    Medtronic adapta  . Chronic anticoagulation    Past Surgical History  Procedure Laterality Date  . Permanent pacemaker insertion  10/16/2006    Medtronic Enrhythm  . Permanent pacemaker generator change  09/04/2012    Medtronic adapta  . Coronary angioplasty  09/16/1993    100% CX-successful  . Cardiac catheterization  05/11/2002    70-80% small first diagonal,50% RCA  . Cardiac catheterization  10/14/2006    multivessell,EF 40-50%  . Nm myoview ltd  08/30/2009    mod. perfusion defect-mid inferior,apical inferior,basal inferolateral,mid inferolateral & apical lateral regions  . US echocardiography  03/27/2012    severe MR persists w/progressive LA dilatation,RSVP has increased & AS has progressed    FAMHx: Family History  Problem Relation Age of Onset  . Diabetes Sister     SOCHx:  reports that he has never smoked. He has never used smokeless tobacco. He reports that he  does not drink alcohol. His drug history is not on file.  ALLERGIES: Allergies  Allergen Reactions  . Codeine Nausea And Vomiting     ROS General: positive  fevers, chills, no night sweats;  HEENT: Decreased hearing; No changes in vision, , difficulty swallowing Pulmonary: positive for  cough, wheezing, shortness of breath, no hemoptysis Cardiovascular: Positive fior shortness of breath ; No chest pain,  presyncope, syncope, palpatations GI: Negative; No nausea, vomiting, diarrhea, or abdominal pain GU: Negative; No dysuria, hematuria, or difficulty voiding Musculoskeletal: Negative; no myalgias, joint pain, or weakness Hematologic/Oncology: Positive for prostate Ca; no easy bruising, bleeding Endocrine: Negative; no heat/cold intolerance Neuro: Negative; no changes in balance, headaches Skin: Negative; No rashes or skin lesions Psychiatric: Negative; No behavioral problems, depression Sleep: Negative; No snoring, daytime sleepiness, hypersomnolence, bruxism, restless legs, hypnogognic hallucinations, no cataplexy Other comprehensive 14 review of systems was negative   HOME MEDICATIONS: Prescriptions prior to admission  Medication Sig Dispense Refill  . aspirin 81 MG tablet Take 81 mg by mouth daily.      . furosemide (LASIX) 20 MG tablet Take 20 mg by mouth daily as needed for fluid.      Marland Kitchen levofloxacin (LEVAQUIN) 500 MG tablet Take 1 tablet (500 mg total) by mouth daily.  7 tablet  0  . Multiple Vitamin (MULTIVITAMIN WITH MINERALS) TABS Take 1 tablet by mouth daily.      . nebivolol (BYSTOLIC) 5 MG tablet Take 5 mg by mouth daily.      . ramipril (ALTACE) 10 MG capsule Take 1 capsule (10 mg total) by mouth daily.  90 capsule  3  . simvastatin (ZOCOR) 20 MG tablet Take 20 mg by mouth every morning.      . warfarin (COUMADIN) 5 MG tablet Take 5 mg by mouth every morning. 2.$RemoveBeforeDE'5mg'NPCnhxJAcpXwgLR$  Monday, Wednesday, and Friday.  $Remove'5mg'jGduGej$  all other days        HOSPITAL MEDICATIONS: Scheduled: . aspirin  81 mg Oral Daily  . budesonide  0.25 mg Nebulization QID  . ipratropium-albuterol  3 mL Nebulization QID  . [START ON 10/17/2013] levofloxacin  500 mg Oral Daily  . methylPREDNISolone (SOLU-MEDROL) injection  40 mg Intravenous Q12H  . multivitamin with minerals  1 tablet Oral Daily  . nebivolol  5 mg Oral Daily  . simvastatin  20 mg Oral q1800  . sodium chloride  1,000 mL Intravenous Once  . warfarin  5 mg  Oral ONCE-1800  . Warfarin - Pharmacist Dosing Inpatient   Does not apply q1800    VITALS: Blood pressure 89/53, pulse 70, temperature 97.8 F (36.6 C), temperature source Axillary, resp. rate 17, height $RemoveBe'6\' 2"'RvCVNBatZ$  (1.88 m), weight 173 lb 15.1 oz (78.9 kg), SpO2 93.00%.  PHYSICAL EXAM: General appearance: alert, cooperative and no distress Neck: no adenopathy, no JVD, supple, symmetrical, trachea midline and thyroid not enlarged, symmetric, no tenderness/mass/nodules Lungs: diffuse bilateral rhochi R>L Heart: irregularly irregular rhythm and 3/6 MR murmur Abdomen: soft, non-tender; bowel sounds normal; no masses,  no organomegaly Extremities: no edema, redness or tenderness in the calves or thighs Pulses: 2+ and symmetric Skin: Skin color, texture, turgor normal. No rashes or lesions Neurologic: Grossly normal  LABS: Results for orders placed during the hospital encounter of 10/16/13 (from the past 48 hour(s))  PRO B NATRIURETIC PEPTIDE     Status: Abnormal   Collection Time    10/16/13  3:45 AM      Result Value Ref Range   Pro B Natriuretic peptide (BNP)  3137.0 (*) 0 - 450 pg/mL  COMPREHENSIVE METABOLIC PANEL     Status: Abnormal   Collection Time    10/16/13  3:45 AM      Result Value Ref Range   Sodium 136 (*) 137 - 147 mEq/L   Potassium 4.2  3.7 - 5.3 mEq/L   Chloride 94 (*) 96 - 112 mEq/L   CO2 26  19 - 32 mEq/L   Glucose, Bld 144 (*) 70 - 99 mg/dL   BUN 20  6 - 23 mg/dL   Creatinine, Ser 1.12  0.50 - 1.35 mg/dL   Calcium 9.4  8.4 - 10.5 mg/dL   Total Protein 7.7  6.0 - 8.3 g/dL   Albumin 4.2  3.5 - 5.2 g/dL   AST 20  0 - 37 U/L   ALT 12  0 - 53 U/L   Alkaline Phosphatase 62  39 - 117 U/L   Total Bilirubin 1.8 (*) 0.3 - 1.2 mg/dL   GFR calc non Af Amer 59 (*) >90 mL/min   GFR calc Af Amer 69 (*) >90 mL/min   Comment: (NOTE)     The eGFR has been calculated using the CKD EPI equation.     This calculation has not been validated in all clinical situations.     eGFR's  persistently <90 mL/min signify possible Chronic Kidney     Disease.  CBC     Status: Abnormal   Collection Time    10/16/13  3:45 AM      Result Value Ref Range   WBC 14.5 (*) 4.0 - 10.5 K/uL   RBC 4.73  4.22 - 5.81 MIL/uL   Hemoglobin 15.8  13.0 - 17.0 g/dL   HCT 44.2  39.0 - 52.0 %   MCV 93.4  78.0 - 100.0 fL   MCH 33.4  26.0 - 34.0 pg   MCHC 35.7  30.0 - 36.0 g/dL   RDW 14.5  11.5 - 15.5 %   Platelets 174  150 - 400 K/uL  I-STAT TROPOININ, ED     Status: None   Collection Time    10/16/13  3:48 AM      Result Value Ref Range   Troponin i, poc 0.01  0.00 - 0.08 ng/mL   Comment 3            Comment: Due to the release kinetics of cTnI,     a negative result within the first hours     of the onset of symptoms does not rule out     myocardial infarction with certainty.     If myocardial infarction is still suspected,     repeat the test at appropriate intervals.  URINALYSIS, ROUTINE W REFLEX MICROSCOPIC     Status: Abnormal   Collection Time    10/16/13  3:52 AM      Result Value Ref Range   Color, Urine AMBER (*) YELLOW   Comment: BIOCHEMICALS MAY BE AFFECTED BY COLOR   APPearance CLEAR  CLEAR   Specific Gravity, Urine 1.023  1.005 - 1.030   pH 5.0  5.0 - 8.0   Glucose, UA NEGATIVE  NEGATIVE mg/dL   Hgb urine dipstick MODERATE (*) NEGATIVE   Bilirubin Urine NEGATIVE  NEGATIVE   Ketones, ur NEGATIVE  NEGATIVE mg/dL   Protein, ur 30 (*) NEGATIVE mg/dL   Urobilinogen, UA 1.0  0.0 - 1.0 mg/dL   Nitrite NEGATIVE  NEGATIVE   Leukocytes, UA NEGATIVE  NEGATIVE  URINE MICROSCOPIC-ADD  ON     Status: Abnormal   Collection Time    10/16/13  3:52 AM      Result Value Ref Range   Squamous Epithelial / LPF RARE  RARE   RBC / HPF 3-6  <3 RBC/hpf   Bacteria, UA RARE  RARE   Casts HYALINE CASTS (*) NEGATIVE  I-STAT CG4 LACTIC ACID, ED     Status: Abnormal   Collection Time    10/16/13  3:58 AM      Result Value Ref Range   Lactic Acid, Venous 2.40 (*) 0.5 - 2.2 mmol/L    PROTIME-INR     Status: Abnormal   Collection Time    10/16/13  4:15 AM      Result Value Ref Range   Prothrombin Time 19.8 (*) 11.6 - 15.2 seconds   INR 1.74 (*) 0.00 - 1.49  I-STAT VENOUS BLOOD GAS, ED     Status: Abnormal   Collection Time    10/16/13  4:23 AM      Result Value Ref Range   pH, Ven 7.232 (*) 7.250 - 7.300   pCO2, Ven 63.8 (*) 45.0 - 50.0 mmHg   pO2, Ven 21.0 (*) 30.0 - 45.0 mmHg   Bicarbonate 26.2 (*) 20.0 - 24.0 mEq/L   TCO2 28  0 - 100 mmol/L   O2 Saturation 21.0     Acid-base deficit 2.0  0.0 - 2.0 mmol/L   Patient temperature 102.0 F     Sample type VENOUS     Comment NOTIFIED PHYSICIAN    TRIGLYCERIDES     Status: None   Collection Time    10/16/13  5:19 AM      Result Value Ref Range   Triglycerides 76  <150 mg/dL  I-STAT VENOUS BLOOD GAS, ED     Status: Abnormal   Collection Time    10/16/13  5:25 AM      Result Value Ref Range   pH, Ven 7.307 (*) 7.250 - 7.300   pCO2, Ven 48.2  45.0 - 50.0 mmHg   pO2, Ven 53.0 (*) 30.0 - 45.0 mmHg   Bicarbonate 23.6  20.0 - 24.0 mEq/L   TCO2 25  0 - 100 mmol/L   O2 Saturation 79.0     Acid-base deficit 2.0  0.0 - 2.0 mmol/L   Patient temperature 102.0 F     Sample type VENOUS    MRSA PCR SCREENING     Status: None   Collection Time    10/16/13  8:17 AM      Result Value Ref Range   MRSA by PCR NEGATIVE  NEGATIVE   Comment:            The GeneXpert MRSA Assay (FDA     approved for NASAL specimens     only), is one component of a     comprehensive MRSA colonization     surveillance program. It is not     intended to diagnose MRSA     infection nor to guide or     monitor treatment for     MRSA infections.  GLUCOSE, CAPILLARY     Status: Abnormal   Collection Time    10/16/13  8:17 AM      Result Value Ref Range   Glucose-Capillary 142 (*) 70 - 99 mg/dL  CBC     Status: Abnormal   Collection Time    10/16/13  8:32 AM      Result Value Ref  Range   WBC 10.6 (*) 4.0 - 10.5 K/uL   RBC 4.12 (*) 4.22  - 5.81 MIL/uL   Hemoglobin 13.3  13.0 - 17.0 g/dL   HCT 38.6 (*) 39.0 - 52.0 %   MCV 93.7  78.0 - 100.0 fL   MCH 32.3  26.0 - 34.0 pg   MCHC 34.5  30.0 - 36.0 g/dL   RDW 14.8  11.5 - 15.5 %   Platelets 153  150 - 400 K/uL  CREATININE, SERUM     Status: Abnormal   Collection Time    10/16/13  8:32 AM      Result Value Ref Range   Creatinine, Ser 1.52 (*) 0.50 - 1.35 mg/dL   GFR calc non Af Amer 41 (*) >90 mL/min   GFR calc Af Amer 47 (*) >90 mL/min   Comment: (NOTE)     The eGFR has been calculated using the CKD EPI equation.     This calculation has not been validated in all clinical situations.     eGFR's persistently <90 mL/min signify possible Chronic Kidney     Disease.  LACTIC ACID, PLASMA     Status: Abnormal   Collection Time    10/16/13  8:32 AM      Result Value Ref Range   Lactic Acid, Venous 2.9 (*) 0.5 - 2.2 mmol/L  TROPONIN I     Status: Abnormal   Collection Time    10/16/13  8:32 AM      Result Value Ref Range   Troponin I 0.71 (*) <0.30 ng/mL   Comment:            Due to the release kinetics of cTnI,     a negative result within the first hours     of the onset of symptoms does not rule out     myocardial infarction with certainty.     If myocardial infarction is still suspected,     repeat the test at appropriate intervals.     CRITICAL RESULT CALLED TO, READ BACK BY AND VERIFIED WITH:     RUDINBRN [id number] MCCAULEG  GLUCOSE, CAPILLARY     Status: Abnormal   Collection Time    10/16/13 11:10 AM      Result Value Ref Range   Glucose-Capillary 133 (*) 70 - 99 mg/dL    IMAGING: Dg Chest 2 View  10/15/2013   CLINICAL DATA:  Cough, shortness of breath  EXAM: CHEST  2 VIEW  COMPARISON:  DG CHEST 2 VIEW dated 09/04/2012; DG CHEST 2 VIEW dated 10/17/2006; DG CHEST 2 VIEW dated 10/07/2006  FINDINGS: Grossly unchanged enlarged cardiac silhouette and mediastinal contours with prominence of the central pulmonary arteries. Stable positioning of support apparatus. The  lungs remain hyperexpanded with flattening of the bilateral diaphragms and mild diffuse slightly nodular thickening of the pulmonary interstitium. There is minimal pleural parenchymal thickening about the right minor and the bilateral major fissures. No focal airspace opacities. No pleural effusion or pneumothorax. No evidence of edema. Unchanged bones including mild (< 25%) compression deformity of a lower thoracic vertebral body. Post cholecystectomy.  IMPRESSION: 1. Hyperexpanded lungs and bronchitic change without acute cardiopulmonary disease. 2. Cardiomegaly and prominence of the central pulmonary arteries, nonspecific though could be seen in the setting of pulmonary arterial hypertension. Further evaluation cardiac echo could be performed as clinically indicated.   Electronically Signed   By: Sandi Mariscal M.D.   On: 10/15/2013 16:32   Dg Chest Port 1  View  10/16/2013   CLINICAL DATA:  Shortness of breath  EXAM: PORTABLE CHEST - 1 VIEW  COMPARISON:  DG CHEST 2 VIEW dated 10/15/2013  FINDINGS: Cardiac enlargement is more prominent than on prior study. Developing interstitial changes in the lungs suggesting possible interstitial edema. Pulmonary vascularity is normal. No blunting of costophrenic angles. No focal consolidation. Cardiac pacemaker.  IMPRESSION: Enlarging heart size with developing interstitial changes in the lungs, possibly due to edema.   Electronically Signed   By: Lucienne Capers M.D.   On: 10/16/2013 04:09    IMPRESSION:  Respiratory Failure Probable Type 2 NSTEMI CAD Permanent AF S/P Permament pacemaker for SSS Severe MR secondary to partial flail posterior MVleaflet Prostate CA Bronchitis/Pneumonia Hypergylcemia   RECOMMENDATION:  Pt is now breathing much better with BiPAP, diuresis and antibiotic therapy. In the past pt did not want to undergo MV repair or surgery. He suffered and old Lcx MI and has concomitant CAD with last cath 7 yrs ago. Would re-assess echo. Discussed  again aggressive further evaluation vs conservative medical management; he is contemplating. Will check serial enzymes. BNP. AF rate controlled on warfarin anticoagulation. F/U INR with potential levoquin interaction with coumadin in 60 - 70% of patients and need for potential coumadin reduction. Will follow.   Attending:  Troy Sine, MD, [facility] 10/16/2013 2:08 PM

## 2013-10-16 NOTE — Progress Notes (Signed)
No new complaints. Tolerates off BiPAP without distress. Cognition intact  Filed Vitals:   10/16/13 1000 10/16/13 1008 10/16/13 1100 10/16/13 1125  BP: 97/64 97/64 116/93   Pulse: 72  92   Temp:    97.8 F (36.6 C)  TempSrc:    Axillary  Resp: 18  22   Height:      Weight:      SpO2: 100%  100%    NAD JVP not well visualized Diffuse coarse wheezes Distant HS, no M noted, regular NABS Ext warm without edema  BMET    Component Value Date/Time   NA 136* 10/16/2013 0345   K 4.2 10/16/2013 0345   CL 94* 10/16/2013 0345   CO2 26 10/16/2013 0345   GLUCOSE 144* 10/16/2013 0345   BUN 20 10/16/2013 0345   CREATININE 1.52* 10/16/2013 0832   CREATININE 1.23 10/28/2012 1135   CALCIUM 9.4 10/16/2013 0345   GFRNONAA 41* 10/16/2013 0832   GFRAA 47* 10/16/2013 0832    CBC    Component Value Date/Time   WBC 10.6* 10/16/2013 0832   RBC 4.12* 10/16/2013 0832   HGB 13.3 10/16/2013 0832   HCT 38.6* 10/16/2013 0832   PLT 153 10/16/2013 0832   MCV 93.7 10/16/2013 0832   MCH 32.3 10/16/2013 0832   MCHC 34.5 10/16/2013 0832   RDW 14.8 10/16/2013 0832   Pro-BNP 3137  CXR: CM, minimal IS prominence  IMPRESSION: Fever, resolved Acute resp failure, much improved  Acute bronchospasm - suspect asthmatic bronchitis  No overt edema on CXR of by exam Mild elevation Trop I Elevated BNP, H/O CHF  H/o CAD  Followed by [name] - last seen 5/01 Mild acute renal insufficiency Mild hyperglycemia  PLAN: DC NPPV Scheduled BDs and steroids Modest dose of systemic steroids Change abx to PO levofloxacin only OK to transfer to Tele Echocardiogram ordered Will notify [name] Monitor glucose on chem panels. Consider SSI coverage if > 200 Hold ACEI for now   30 mins CCM time   Merton Border, MD ; [facility] [phone].  After 5:30 PM or weekends, call [phone]

## 2013-10-16 NOTE — Procedures (Deleted)
Intubation Procedure Note Norman Lee [id number] 05-29-1929  Procedure: Intubation Indications: Respiratory insufficiency  Procedure Details Consent: Unable to obtain consent because of altered level of consciousness. Time Out: Verified patient identification, verified procedure, site/side was marked, verified correct patient position, special equipment/implants available, medications/allergies/relevent history reviewed, required imaging and test results available.  Performed  Maximum sterile technique was used including gloves and mask.   Induced with Propafol 100 and Fentanyl 200. Paralyzed with 40 mg Roc. Glidescope used, grade 1 view. 7.5 ETT placed after one attempt Secured at 25 cm Bilateral breath sounds and good color change   Evaluation Hemodynamic Status: BP stable throughout; O2 sats: transiently fell during during procedure Patient's Current Condition: stable Complications: No apparent complications Patient did tolerate procedure well. Chest X-ray ordered to verify placement.  CXR: pending.   Mariea Clonts 10/16/2013

## 2013-10-16 NOTE — ED Notes (Signed)
Resp called to place pt on BIPAP.

## 2013-10-16 NOTE — ED Notes (Signed)
Dr Manly given a copy of venous blood gas results 

## 2013-10-16 NOTE — ED Notes (Signed)
Per EMS pt is from home, pt was dx with pneumonia yesterday and was started on Levaquin PO. Pt presents to the department with increased SOB. EMS reports pt's 02 was 88% on RA. Pt is anxious. EMS reports SOB increases when he lays down flat. Pt has been ill since Tuesday. Pt is A&O X4. Pt HTN 184/101 and temp 101.8.

## 2013-10-16 NOTE — Progress Notes (Signed)
ANTICOAGULATION CONSULT NOTE - Initial Consult  Pharmacy Consult for Coumadin Indication: atrial fibrillation  Allergies  Allergen Reactions  . Codeine Nausea And Vomiting    Patient Measurements: Height: 6\' 2"  (188 cm) Weight: 174 lb (78.926 kg) IBW/kg (Calculated) : 82.2  Vital Signs: Temp: 99.7 F (37.6 C) (05/02 0645) Temp src: Rectal (05/02 0645) BP: 94/54 mmHg (05/02 0732) Pulse Rate: 70 (05/02 0715)  Labs:  Recent Labs  10/16/13 0345 10/16/13 0415  HGB 15.8  --   HCT 44.2  --   PLT 174  --   LABPROT  --  19.8*  INR  --  1.74*  CREATININE 1.12  --     Estimated Creatinine Clearance: 56.7 ml/min (by C-G formula based on Cr of 1.12).   Medical History: Past Medical History  Diagnosis Date  . Prostate cancer   . CAD (coronary artery disease)     Circumflex MI 1995, Last cath 2008  . Mitral regurgitation     Severe  . HLD (hyperlipidemia)   . HTN (hypertension)   . Atrial fibrillation   . Pulmonary hypertension   . Presence of permanent cardiac pacemaker 10/16/2006,09/04/12    Medtronic adapta  . Chronic anticoagulation     Medications:  ASA  Lasix  Levaquin  MVI  Bystolic  Altace  Zocor Coumadin 5 mg TTSS 2.5 mg MWF    Assessment: 80 yo male admitted with SOB, h/o AFib, to continue Coumadin  Goal of Therapy:  INR 2-3 Monitor platelets by anticoagulation protocol: Yes   Plan:  Coumadin 5 mg tonight Daily INR  [employer] Danaysha Kirn 10/16/2013,7:41 AM

## 2013-10-16 NOTE — ED Notes (Signed)
This RN spoke with pt's wife and informed her of the pt's condition per pt's permission. Wife and brother in law are coming to hospital.

## 2013-10-17 DIAGNOSIS — J45909 Unspecified asthma, uncomplicated: Secondary | ICD-10-CM

## 2013-10-17 DIAGNOSIS — J9600 Acute respiratory failure, unspecified whether with hypoxia or hypercapnia: Secondary | ICD-10-CM

## 2013-10-17 DIAGNOSIS — I359 Nonrheumatic aortic valve disorder, unspecified: Secondary | ICD-10-CM

## 2013-10-17 LAB — BASIC METABOLIC PANEL
BUN: 32 mg/dL — ABNORMAL HIGH (ref 6–23)
CALCIUM: 9.8 mg/dL (ref 8.4–10.5)
CO2: 29 mEq/L (ref 19–32)
CREATININE: 1.43 mg/dL — AB (ref 0.50–1.35)
Chloride: 98 mEq/L (ref 96–112)
GFR, EST AFRICAN AMERICAN: 51 mL/min — AB (ref >90)
GFR, EST NON AFRICAN AMERICAN: 44 mL/min — AB (ref >90)
Glucose, Bld: 145 mg/dL — ABNORMAL HIGH (ref 70–99)
Potassium: 4.8 mEq/L (ref 3.7–5.3)
Sodium: 139 mEq/L (ref 137–147)

## 2013-10-17 LAB — PROTIME-INR
INR: 1.69 — ABNORMAL HIGH (ref 0.00–1.49)
PROTHROMBIN TIME: 19.4 s — AB (ref 11.6–15.2)

## 2013-10-17 LAB — PRO B NATRIURETIC PEPTIDE: PRO B NATRI PEPTIDE: 7399 pg/mL — AB (ref 0–450)

## 2013-10-17 LAB — CBC
HCT: 38.6 % — ABNORMAL LOW (ref 39.0–52.0)
HEMOGLOBIN: 13.2 g/dL (ref 13.0–17.0)
MCH: 32.1 pg (ref 26.0–34.0)
MCHC: 34.2 g/dL (ref 30.0–36.0)
MCV: 93.9 fL (ref 78.0–100.0)
PLATELETS: 142 10*3/uL — AB (ref 150–400)
RBC: 4.11 MIL/uL — ABNORMAL LOW (ref 4.22–5.81)
RDW: 14.5 % (ref 11.5–15.5)
WBC: 9.8 10*3/uL (ref 4.0–10.5)

## 2013-10-17 LAB — TROPONIN I: Troponin I: <0.3 ng/mL (ref <0.30)

## 2013-10-17 MED ORDER — PREDNISONE 20 MG PO TABS
40.0000 mg | ORAL_TABLET | Freq: Every day | ORAL | Status: DC
  Administered 2013-10-17 – 2013-10-18 (×2): 40 mg via ORAL
  Filled 2013-10-17 (×3): qty 2

## 2013-10-17 MED ORDER — ALBUTEROL SULFATE (2.5 MG/3ML) 0.083% IN NEBU: 2.5000 mg | INHALATION_SOLUTION | RESPIRATORY_TRACT | Status: DC | PRN

## 2013-10-17 MED ORDER — MOMETASONE FURO-FORMOTEROL FUM 100-5 MCG/ACT IN AERO
2.0000 | INHALATION_SPRAY | Freq: Two times a day (BID) | RESPIRATORY_TRACT | Status: DC
  Administered 2013-10-17 – 2013-10-18 (×2): 2 via RESPIRATORY_TRACT
  Filled 2013-10-17: qty 8.8

## 2013-10-17 MED ORDER — RAMIPRIL 5 MG PO CAPS
5.0000 mg | ORAL_CAPSULE | Freq: Every day | ORAL | Status: DC
  Administered 2013-10-17 – 2013-10-18 (×2): 5 mg via ORAL
  Filled 2013-10-17 (×2): qty 1

## 2013-10-17 MED ORDER — RAMIPRIL 5 MG PO CAPS: 5.0000 mg | ORAL_CAPSULE | Freq: Every day | ORAL | Status: DC

## 2013-10-17 MED ORDER — RAMIPRIL 10 MG PO CAPS
10.0000 mg | ORAL_CAPSULE | Freq: Every day | ORAL | Status: DC
  Filled 2013-10-17: qty 1

## 2013-10-17 MED ORDER — WARFARIN SODIUM 5 MG PO TABS
5.0000 mg | ORAL_TABLET | Freq: Once | ORAL | Status: AC
  Administered 2013-10-17: 5 mg via ORAL
  Filled 2013-10-17: qty 1

## 2013-10-17 NOTE — Progress Notes (Signed)
Feels better. No new complaints.  Filed Vitals:   10/17/13 0838 10/17/13 1315 10/17/13 1500 10/17/13 1701  BP:  115/68    Pulse:  75    Temp:  97.1 F (36.2 C)    TempSrc:  Oral    Resp:  17    Height:      Weight:      SpO2: 99% 98% 97% 95%   NAD JVP not well visualized Improved wheezes Distant HS, no M noted, regular NABS Ext warm without edema  BMET    Component Value Date/Time   NA 139 10/17/2013 0455   K 4.8 10/17/2013 0455   CL 98 10/17/2013 0455   CO2 29 10/17/2013 0455   GLUCOSE 145* 10/17/2013 0455   BUN 32* 10/17/2013 0455   CREATININE 1.43* 10/17/2013 0455   CREATININE 1.23 10/28/2012 1135   CALCIUM 9.8 10/17/2013 0455   GFRNONAA 44* 10/17/2013 0455   GFRAA 51* 10/17/2013 0455    CBC    Component Value Date/Time   WBC 9.8 10/17/2013 0455   RBC 4.11* 10/17/2013 0455   HGB 13.2 10/17/2013 0455   HCT 38.6* 10/17/2013 0455   PLT 142* 10/17/2013 0455   MCV 93.9 10/17/2013 0455   MCH 32.1 10/17/2013 0455   MCHC 34.2 10/17/2013 0455   RDW 14.5 10/17/2013 0455   Pro-BNP 3137  CXR: NNF  Echo: LVEF 50 - 55%, mild MR  IMPRESSION: Fever, resolved Acute resp failure, much improved  Acute bronchospasm - suspect asthmatic bronchitis, much improved Mild elevation Trop I Elevated BNP, H/O CHF  H/o CAD  Followed by [name] - last seen 5/01 Mild acute renal insufficiency, improved Mild hyperglycemia  PLAN: Begin Symbicort MDI - will maintain him on this for at least 4 wks after DC Change BDs to PRN Change systemic steroids to PO and taper ver next 5-7 days Complete 5 days PO levofloxacin [name] input appreciated Resume Ramipril Anticipate DC home 5/04   Merton Border, MD ; [facility] service Mobile [phone].  After 5:30 PM or weekends, call [phone]

## 2013-10-17 NOTE — Progress Notes (Signed)
Subjective:  Breathing much better; much less cough  Objective:   Vital Signs in the last 24 hours: Temp:  [97.1 F (36.2 C)-98.2 F (36.8 C)] 97.1 F (36.2 C) (05/03 1315) Pulse Rate:  [70-77] 75 (05/03 1315) Resp:  [17-22] 17 (05/03 1315) BP: (109-115)/(65-68) 115/68 mmHg (05/03 1315) SpO2:  [94 %-100 %] 98 % (05/03 1315) Weight:  [170 lb 12.8 oz (77.474 kg)] 170 lb 12.8 oz (77.474 kg) (05/03 0623)  Intake/Output from previous day: 05/02 0701 - 05/03 0700 In: 21 [P.O.:240; I.V.:30; IV Piggyback:300] Out: 575 [Urine:575]  Medications: . aspirin  81 mg Oral Daily  . levofloxacin  500 mg Oral Daily  . mometasone-formoterol  2 puff Inhalation BID  . multivitamin with minerals  1 tablet Oral Daily  . nebivolol  5 mg Oral Daily  . predniSONE  40 mg Oral Q breakfast  . ramipril  10 mg Oral Daily  . simvastatin  20 mg Oral q1800  . sodium chloride  1,000 mL Intravenous Once  . warfarin  5 mg Oral ONCE-1800  . Warfarin - Pharmacist Dosing Inpatient   Does not apply q1800       Physical Exam:   General appearance: alert, cooperative and no distress Neck: no adenopathy, no JVD, supple, symmetrical, trachea midline and thyroid not enlarged, symmetric, no tenderness/mass/nodules Lungs: aeration signifiantly better Heart: regular rate and rhythm, underlying AF but V paced; 2/6 AS  Murmur and 2/6 MR murmur at apex Abdomen: soft, non-tender; bowel sounds normal; no masses,  no organomegaly Extremities: no edema, redness or tenderness in the calves or thighs Skin: Skin color, texture, turgor normal. No rashes or lesions Neurologic: Grossly normal   Rate: 80  Rhythm:   Ventricular paced  Lab Results:   Recent Labs  10/16/13 0345 10/16/13 0832 10/17/13 0455  NA 136*  --  139  K 4.2  --  4.8  CL 94*  --  98  CO2 26  --  29  GLUCOSE 144*  --  145*  BUN 20  --  32*  CREATININE 1.12 1.52* 1.43*   CBC    Component Value Date/Time   WBC 9.8 10/17/2013 0455   RBC  4.11* 10/17/2013 0455   HGB 13.2 10/17/2013 0455   HCT 38.6* 10/17/2013 0455   PLT 142* 10/17/2013 0455   MCV 93.9 10/17/2013 0455   MCH 32.1 10/17/2013 0455   MCHC 34.2 10/17/2013 0455   RDW 14.5 10/17/2013 0455     Recent Labs  10/16/13 2047 10/17/13 0455  TROPONINI 0.39* <0.30    Hepatic Function Panel  Recent Labs  10/16/13 0345  PROT 7.7  ALBUMIN 4.2  AST 20  ALT 12  ALKPHOS 62  BILITOT 1.8*    Recent Labs  10/17/13 0455  INR 1.69*   BNP (last 3 results)  Recent Labs  10/16/13 0345 10/17/13 0455  PROBNP 3137.0* 7399.0*    Lipid Panel     Component Value Date/Time   CHOL 141 10/28/2012 1135   TRIG 76 10/16/2013 0519   HDL 48 10/28/2012 1135   CHOLHDL 2.9 10/28/2012 1135   VLDL 21 10/28/2012 1135   LDLCALC 72 10/28/2012 1135      Imaging:  Dg Chest 2 View  10/15/2013   CLINICAL DATA:  Cough, shortness of breath  EXAM: CHEST  2 VIEW  COMPARISON:  DG CHEST 2 VIEW dated 09/04/2012; DG CHEST 2 VIEW dated 10/17/2006; DG CHEST 2 VIEW dated 10/07/2006  FINDINGS: Grossly unchanged enlarged cardiac  silhouette and mediastinal contours with prominence of the central pulmonary arteries. Stable positioning of support apparatus. The lungs remain hyperexpanded with flattening of the bilateral diaphragms and mild diffuse slightly nodular thickening of the pulmonary interstitium. There is minimal pleural parenchymal thickening about the right minor and the bilateral major fissures. No focal airspace opacities. No pleural effusion or pneumothorax. No evidence of edema. Unchanged bones including mild (< 25%) compression deformity of a lower thoracic vertebral body. Post cholecystectomy.  IMPRESSION: 1. Hyperexpanded lungs and bronchitic change without acute cardiopulmonary disease. 2. Cardiomegaly and prominence of the central pulmonary arteries, nonspecific though could be seen in the setting of pulmonary arterial hypertension. Further evaluation cardiac echo could be performed as clinically  indicated.   Electronically Signed   By: Sandi Mariscal M.D.   On: 10/15/2013 16:32   Dg Chest Port 1 View  10/16/2013   CLINICAL DATA:  Shortness of breath  EXAM: PORTABLE CHEST - 1 VIEW  COMPARISON:  DG CHEST 2 VIEW dated 10/15/2013  FINDINGS: Cardiac enlargement is more prominent than on prior study. Developing interstitial changes in the lungs suggesting possible interstitial edema. Pulmonary vascularity is normal. No blunting of costophrenic angles. No focal consolidation. Cardiac pacemaker.  IMPRESSION: Enlarging heart size with developing interstitial changes in the lungs, possibly due to edema.   Electronically Signed   By: Lucienne Capers M.D.   On: 10/16/2013 04:09    Echo Study Conclusions  - Left ventricle: The cavity size was normal. Systolic function was normal. The estimated ejection fraction was in the range of 55% to 60%. Possible hypokinesis of the apical myocardium. - Aortic valve: Diffuse thickening and calcification. Valve mobility was restricted. There was moderate stenosis. Valve area: 0.68cm^2(VTI). Valve area: 0.79cm^2 (Vmax). - Mitral valve: Severely calcified annulus. Moderately thickened, moderately calcified leaflets . Mild regurgitation. Valve area by continuity equation (using LVOT flow): 1.23cm^2. - Left atrium: The atrium was moderately dilated. - Right ventricle: The cavity size was moderately dilated. Wall thickness was normal.    Assessment/Plan:   Active Problems:   Pacemaker - Medtronic Adapta 2014, leads 2008   CAD (coronary artery disease)   Respiratory failure  Feels much better. Aeration much better in lungs. Will need to personally review echo since ? MR severity since previously this was severe with partial flail leaflet. AS with 42 mm peak and 23 mm mean gradient. Anticipate possible dc tomorrow. Will decrease ramipril dose to 5 mg. INR 1.69; aim for 2-2.5.    Troy Sine, MD, [facility] 10/17/2013, 3:10 PM

## 2013-10-17 NOTE — Progress Notes (Signed)
ANTICOAGULATION CONSULT NOTE - Initial Consult  Pharmacy Consult for Coumadin Indication: atrial fibrillation  Allergies  Allergen Reactions  . Codeine Nausea And Vomiting    Patient Measurements: Height: 6\' 2"  (188 cm) Weight: 170 lb 12.8 oz (77.474 kg) IBW/kg (Calculated) : 82.2  Vital Signs: Temp: 97.5 F (36.4 C) (05/03 0623) Temp src: Oral (05/03 0623) BP: 111/65 mmHg (05/03 0623) Pulse Rate: 70 (05/03 0623)  Labs:  Recent Labs  10/16/13 0345 10/16/13 0415  10/16/13 0832 10/16/13 1305 10/16/13 1450 10/16/13 1810 10/16/13 2047 10/17/13 0455  HGB 15.8  --   --  13.3  --   --   --   --  13.2  HCT 44.2  --   --  38.6*  --   --   --   --  38.6*  PLT 174  --   --  153  --   --   --   --  142*  LABPROT  --  19.8*  --   --   --   --   --  20.0* 19.4*  INR  --  1.74*  --   --   --   --   --  1.76* 1.69*  CREATININE 1.12  --   --  1.52*  --   --   --   --  1.43*  CKTOTAL  --   --   --   --   --  49  --   --   --   CKMB  --   --   --   --   --  5.3*  --   --   --   TROPONINI  --   --   < > 0.71* 0.65* 0.68* 0.44* 0.39* <0.30  < > = values in this interval not displayed.  Estimated Creatinine Clearance: 43.7 ml/min (by C-G formula based on Cr of 1.43).   Medical History: Past Medical History  Diagnosis Date  . Prostate cancer   . CAD (coronary artery disease)     Circumflex MI 1995, Last cath 2008  . Mitral regurgitation     Severe  . HLD (hyperlipidemia)   . HTN (hypertension)   . Atrial fibrillation   . Pulmonary hypertension   . Presence of permanent cardiac pacemaker 10/16/2006,09/04/12    Medtronic adapta  . Chronic anticoagulation     Assessment: 80 yo male admitted with SOB, h/o AFib, to continue Coumadin, INR 1.69, CBC stable. Pt is on D#2/5 levaquin which could decrease coumadin metabolism.   PTA dose: 5 mg TTSS 2.5 mg MWF     Goal of Therapy:  INR 2-3 Monitor platelets by anticoagulation protocol: Yes   Plan:  Coumadin 5 mg tonight Daily  INR  Manley Mason 10/17/2013,11:59 AM

## 2013-10-18 DIAGNOSIS — I059 Rheumatic mitral valve disease, unspecified: Secondary | ICD-10-CM

## 2013-10-18 DIAGNOSIS — I4891 Unspecified atrial fibrillation: Secondary | ICD-10-CM

## 2013-10-18 LAB — PROTIME-INR
INR: 1.84 — AB (ref 0.00–1.49)
PROTHROMBIN TIME: 20.7 s — AB (ref 11.6–15.2)

## 2013-10-18 LAB — URINE CULTURE: Colony Count: >=100000

## 2013-10-18 MED ORDER — HYDROCODONE-HOMATROPINE 5-1.5 MG/5ML PO SYRP: 5.0000 mL | ORAL_SOLUTION | Freq: Four times a day (QID) | ORAL | Status: DC | PRN

## 2013-10-18 MED ORDER — LEVOFLOXACIN 750 MG PO TABS
750.0000 mg | ORAL_TABLET | ORAL | Status: DC
  Filled 2013-10-18: qty 1

## 2013-10-18 MED ORDER — PREDNISONE 10 MG PO TABS
ORAL_TABLET | ORAL | Status: DC
Start: 2013-10-18 — End: 2014-01-19

## 2013-10-18 MED ORDER — RAMIPRIL 5 MG PO CAPS: 5.0000 mg | ORAL_CAPSULE | Freq: Every day | ORAL | Status: DC

## 2013-10-18 MED ORDER — LEVOFLOXACIN 750 MG PO TABS: 750.0000 mg | ORAL_TABLET | ORAL | Status: DC

## 2013-10-18 MED ORDER — MOMETASONE FURO-FORMOTEROL FUM 100-5 MCG/ACT IN AERO: 2.0000 | INHALATION_SPRAY | Freq: Two times a day (BID) | RESPIRATORY_TRACT | Status: DC

## 2013-10-18 NOTE — Discharge Summary (Signed)
Physician Discharge Summary  Patient ID: Norman Lee MRN: [id number] DOB/AGE: 07-06-1928 80 y.o.  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 The SOB began approximately 1 week ago and has increased in severity. It is preventing him from sleeping. He has noted associated fevers and a cough productive of "gobs" of yellowish sputum. He denies associated chest pain, N/V or diarrhea. He presented to his PCP on 5/1 who prescribed Levaquin.  Hospital Course: IMPRESSION:  Fever, resolved  Acute resp failure, much improved  Acute bronchospasm - suspect asthmatic bronchitis, much improved  Mild elevation Trop I  Elevated BNP, H/O CHF  H/o CAD  Followed by [name] - last seen 5/02  Mild acute renal insufficiency, improved  Mild hyperglycemia  PLAN:  Begin Symbicort MDI - will maintain him on this for at least 4 wks after DC  Change BDs to PRN  Change systemic steroids to PO and taper ver next 5-7 days  Complete 5 days PO levofloxacin  [name] input appreciated  Resume Ramipril at 5 mg  Continue coumadin 5 mg qd and follow up in coumadin clinic.  Lab Results   Component  Value  Date    INR  1.84*  10/18/2013    INR  1.69*  10/17/2013    INR  1.76*  10/16/2013     DC home today.    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 It would be very unusual to be diagnosed with true asthma @ the age of 80.  DC PLAN:  Symbicort or Advair on scheduled basis for next month  Note: he has access to Advair in office and will likely use this  PRN albuterol  Complete course of Pred - 40 mg daily taper. Complete  course of Levofloxacin - 750 mg daily X 4 more days  ROV in [place] in 2-3 wks  Consider PFTs or spirometry @ that time  Consider trial off maintenance therapy @ that time        Labs at discharge Lab Results  Component Value Date   CREATININE 1.43* 10/17/2013   BUN 32* 10/17/2013   NA 139 10/17/2013   K 4.8 10/17/2013   CL 98 10/17/2013   CO2 29 10/17/2013   Lab Results  Component Value Date   WBC 9.8 10/17/2013   HGB 13.2 10/17/2013   HCT 38.6* 10/17/2013   MCV 93.9 10/17/2013   PLT 142* 10/17/2013   Lab Results  Component Value Date   ALT 12 10/16/2013   AST 20 10/16/2013   ALKPHOS 62 10/16/2013   BILITOT 1.8* 10/16/2013   Lab Results  Component Value Date   INR 1.84* 10/18/2013   INR 1.69* 10/17/2013   INR 1.76* 10/16/2013    Current radiology studies No results found.  Disposition:  01-Home or Self Care  Discharge Orders   Future Appointments Provider Department Dept Phone   10/25/2013 2:15 PM [name], NP [facility] [phone]   12/20/2013 10:50 AM [facility] [place] Office [phone]   01/19/2014 3:00 PM Joyice Faster  Claiborne Billings, [place] [phone]   Future Orders Complete By Expires   Discharge patient  As directed        Medication List         aspirin 81 MG tablet  Take 81 mg by mouth daily.     furosemide 20 MG tablet  Commonly known as:  LASIX  Take 20 mg by mouth daily as needed for fluid.     HYDROcodone-homatropine 5-1.5 MG/5ML syrup  Commonly known as:  HYCODAN  Take 5 mLs by mouth every 6 (six) hours as needed for cough.     levofloxacin 750 MG tablet  Commonly known as:  LEVAQUIN  Take 1 tablet (750 mg total) by mouth every other day.  Start taking on:  10/19/2013     mometasone-formoterol 100-5 MCG/ACT Aero  Commonly known as:  DULERA  Inhale 2 puffs into the lungs 2 (two) times daily.     multivitamin with minerals Tabs tablet  Take 1 tablet by mouth daily.     nebivolol 5 MG tablet   Commonly known as:  BYSTOLIC  Take 5 mg by mouth daily.     predniSONE 10 MG tablet  Commonly known as:  DELTASONE  Take 4 tabs  daily with food x 4 days, then 3 tabs daily x 4 days, then 2 tabs daily x 4 days, then 1 tab daily x4 days then stop. #40     ramipril 5 MG capsule  Commonly known as:  ALTACE  Take 1 capsule (5 mg total) by mouth daily.     simvastatin 20 MG tablet  Commonly known as:  ZOCOR  Take 20 mg by mouth every morning.     warfarin 5 MG tablet  Commonly known as:  COUMADIN  Take 5 mg by mouth every morning. 2.5mg  Monday, Wednesday, and Friday.  5mg  all other days           Follow-up Information   Follow up with [name],[name], NP On 10/25/2013. (2:15 pm.)    Specialty:  Nurse Practitioner   Contact information:   [address] [place] [address] [phone]        Discharged Condition: good  Time spent on discharge greater than 40 minutes.  Vital signs at Discharge. Temp:  [97.1 F (36.2 C)-97.5 F (36.4 C)] 97.5 F (36.4 C) (05/04 0508) Pulse Rate:  [72-75] 72 (05/04 0508) Resp:  [17-20] 18 (05/04 0508) BP: (113-122)/(68-87) 122/75 mmHg (05/04 0508) SpO2:  [95 %-98 %] 96 % (05/04 0822) Weight:  [77.5 kg (170 lb 13.7 oz)] 77.5 kg (170 lb 13.7 oz) (05/04 0508) Office follow up Special Information or instructions. Signed: Richardson Landry Minor ACNP [place] Pager [phone] till 3 pm If no answer page [phone] 10/18/2013, 9:35 AM     Merton Border, MD ; [facility] service Mobile [phone].  After 5:30 PM or weekends, call [phone]

## 2013-10-18 NOTE — Progress Notes (Signed)
Feels better. No new complaints. Ready to go home.  Filed Vitals:   10/17/13 1701 10/17/13 2137 10/18/13 0508 10/18/13 0822  BP:  113/87 122/75   Pulse:  73 72   Temp:   97.5 F (36.4 C)   TempSrc:   Oral   Resp:  20 18   Height:      Weight:   77.5 kg (170 lb 13.7 oz)   SpO2: 95% 96% 96% 96%   NAD JVP not well visualized Improved wheezes Distant HS, no M noted, regular NABS Ext warm without edema  BMET    Component Value Date/Time   NA 139 10/17/2013 0455   K 4.8 10/17/2013 0455   CL 98 10/17/2013 0455   CO2 29 10/17/2013 0455   GLUCOSE 145* 10/17/2013 0455   BUN 32* 10/17/2013 0455   CREATININE 1.43* 10/17/2013 0455   CREATININE 1.23 10/28/2012 1135   CALCIUM 9.8 10/17/2013 0455   GFRNONAA 44* 10/17/2013 0455   GFRAA 51* 10/17/2013 0455    CBC    Component Value Date/Time   WBC 9.8 10/17/2013 0455   RBC 4.11* 10/17/2013 0455   HGB 13.2 10/17/2013 0455   HCT 38.6* 10/17/2013 0455   PLT 142* 10/17/2013 0455   MCV 93.9 10/17/2013 0455   MCH 32.1 10/17/2013 0455   MCHC 34.2 10/17/2013 0455   RDW 14.5 10/17/2013 0455   Pro-BNP 3137  CXR: NNF  Echo: LVEF 50 - 55%, mild MR  IMPRESSION: Fever, resolved Acute resp failure, much improved  Acute bronchospasm - suspect asthmatic bronchitis, much improved Mild elevation Trop I Elevated BNP, H/O CHF  H/o CAD  Followed by [name] - last seen 5/02 Mild acute renal insufficiency, improved Mild hyperglycemia  PLAN: Begin Symbicort MDI - will maintain him on this for at least 4 wks after DC Change BDs to PRN Change systemic steroids to PO and taper ver next 5-7 days Complete 5 days PO levofloxacin [name] input appreciated Resume Ramipril at 5 mg Continue coumadin 5 mg qd and follow up in coumadin clinic. Lab Results  Component Value Date   INR 1.84* 10/18/2013   INR 1.69* 10/17/2013   INR 1.76* 10/16/2013    Anticipate DC home today.   Richardson Landry Minor ACNP [facility] Pager [phone] till 3 pm If no answer page [phone] 10/18/2013, 8:56  AM  My best guess is that this is a post viral asthmatic condition and I explained to [name] that it is likely to resolve completely within next several weeks. It would be very unusual to be diagnosed with true asthma @ the age of 87.   DC PLAN: Symbicort or Advair on scheduled basis for next month  Note: he has access to Advair in office and will likely use this PRN albuterol Complete course of Pred - 40 mg daily X 4 more days Complete course of Levofloxacin - 500 mg daily X 4 more days ROV in [facility] in 2-3 wks  Consider PFTs or spirometry @ that timw  Consider trial off maintenance therapy @ that time  Merton Border, MD ; [facility] service Mobile [phone].  After 5:30 PM or weekends, call [phone]   Merton Border, MD ; [facility] service Mobile [phone].  After 5:30 PM or weekends, call [phone]

## 2013-10-18 NOTE — Progress Notes (Signed)
Doing well and ready to go home. Patient examined and agree with the note.

## 2013-10-18 NOTE — Care Management Note (Signed)
    Page 1 of 1   10/18/2013     3:21:03 PM CARE MANAGEMENT NOTE 10/18/2013  Patient:  Norman Lee, Norman Lee   Account Number:  [id number]  Date Initiated:  10/18/2013  Documentation initiated by:  Aydden Cumpian  Subjective/Objective Assessment:   Pt adm on 10/16/13 with respiratory failure.  PTA, pt independent, lives with spouse.     Action/Plan:   Pt discharging home today; no home needs identified.   Anticipated DC Date:  10/18/2013   Anticipated DC Plan:  [place]  CM consult      Choice offered to / List presented to:             Status of service:  Completed, signed off Medicare Important Message given?  YES (If response is "NO", the following Medicare IM given date fields will be blank) Date Medicare IM given:  10/18/2013 Date Additional Medicare IM given:    Discharge Disposition:  HOME/SELF CARE  Per UR Regulation:  Reviewed for med. necessity/level of care/duration of stay  If discussed at [place] of Stay Meetings, dates discussed:    Comments:

## 2013-10-18 NOTE — Progress Notes (Signed)
Patient discharge instructions, medications reviewed with patient at bedside. Patient verbalized understanding. Prescriptions give to patient. IV x 2 out, dressings CDI. Monitor off. Patients wife will be picking patient up. Clovis Riley, RN

## 2013-10-18 NOTE — Progress Notes (Signed)
Subjective:  Breathing much better; much less cough  Objective:   Vital Signs in the last 24 hours: Temp:  [97.1 F (36.2 C)-97.5 F (36.4 C)] 97.5 F (36.4 C) (05/04 0508) Pulse Rate:  [72-75] 72 (05/04 0508) Resp:  [17-20] 18 (05/04 0508) BP: (113-122)/(68-87) 122/75 mmHg (05/04 0508) SpO2:  [95 %-98 %] 96 % (05/04 0822) Weight:  [170 lb 13.7 oz (77.5 kg)] 170 lb 13.7 oz (77.5 kg) (05/04 0508)  Intake/Output from previous day: 05/03 0701 - 05/04 0700 In: 680 [P.O.:680] Out: 1100 [Urine:1100]  Medications: . aspirin  81 mg Oral Daily  . [START ON 10/19/2013] levofloxacin  750 mg Oral Q48H  . mometasone-formoterol  2 puff Inhalation BID  . multivitamin with minerals  1 tablet Oral Daily  . nebivolol  5 mg Oral Daily  . predniSONE  40 mg Oral Q breakfast  . ramipril  5 mg Oral Daily  . simvastatin  20 mg Oral q1800  . sodium chloride  1,000 mL Intravenous Once  . Warfarin - Pharmacist Dosing Inpatient   Does not apply q1800       Physical Exam:   General appearance: alert, cooperative and no distress Neck: no adenopathy, no JVD, supple, symmetrical, trachea midline and thyroid not enlarged, symmetric, no tenderness/mass/nodules Lungs: aeration signifiantly better Heart: regular rate and rhythm, underlying AF but V paced; 2/6 AS  Murmur and 2/6 MR murmur at apex Abdomen: soft, non-tender; bowel sounds normal; no masses,  no organomegaly Extremities: no edema, redness or tenderness in the calves or thighs Skin: Skin color, texture, turgor normal. No rashes or lesions Neurologic: Grossly normal   Rate: 80  Rhythm:   Ventricular paced  Lab Results:   Recent Labs  10/16/13 0345 10/16/13 0832 10/17/13 0455  NA 136*  --  139  K 4.2  --  4.8  CL 94*  --  98  CO2 26  --  29  GLUCOSE 144*  --  145*  BUN 20  --  32*  CREATININE 1.12 1.52* 1.43*   CBC    Component Value Date/Time   WBC 9.8 10/17/2013 0455   RBC 4.11* 10/17/2013 0455   HGB 13.2 10/17/2013 0455   HCT 38.6* 10/17/2013 0455   PLT 142* 10/17/2013 0455   MCV 93.9 10/17/2013 0455   MCH 32.1 10/17/2013 0455   MCHC 34.2 10/17/2013 0455   RDW 14.5 10/17/2013 0455     Recent Labs  10/16/13 2047 10/17/13 0455  TROPONINI 0.39* <0.30    Hepatic Function Panel  Recent Labs  10/16/13 0345  PROT 7.7  ALBUMIN 4.2  AST 20  ALT 12  ALKPHOS 62  BILITOT 1.8*    Recent Labs  10/18/13 0535  INR 1.84*   BNP (last 3 results)  Recent Labs  10/16/13 0345 10/17/13 0455  PROBNP 3137.0* 7399.0*    Lipid Panel     Component Value Date/Time   CHOL 141 10/28/2012 1135   TRIG 76 10/16/2013 0519   HDL 48 10/28/2012 1135   CHOLHDL 2.9 10/28/2012 1135   VLDL 21 10/28/2012 1135   LDLCALC 72 10/28/2012 1135      Imaging:  No results found.  Echo Study Conclusions  - Left ventricle: The cavity size was normal. Systolic function was normal. The estimated ejection fraction was in the range of 55% to 60%. Possible hypokinesis of the apical myocardium. - Aortic valve: Diffuse thickening and calcification. Valve mobility was restricted. There was moderate stenosis. Valve area: 0.68cm^2(VTI). Valve  area: 0.79cm^2 (Vmax). - Mitral valve: Severely calcified annulus. Moderately thickened, moderately calcified leaflets . Mild regurgitation. Valve area by continuity equation (using LVOT flow): 1.23cm^2. - Left atrium: The atrium was moderately dilated. - Right ventricle: The cavity size was moderately dilated. Wall thickness was normal.    Assessment/Plan:   Active Problems:   Pacemaker - Medtronic Adapta 2014, leads 2008   CAD (coronary artery disease)   Respiratory failure   Asthmatic bronchitis  Norman Lee is a 80 y.o. male with and history of DM, CAD, MR, AF on coumadin, SSS s/p PPM, and prostate Ca seen in the office by Dr. Claiborne Billings for increasing cough, purulent sputum, and some shortness of breath. He was sent to [facility] where he was found to have sepsis and acute respiratory  failure d/t a combination of heart failure and pneumonia. He was found to have elevated troponins thought to be d/t demand ischemia from CHF.  Respiratory Failure - resolved  Probable Type 2 NSTEMI - no interventions  Permanent AF - continue coumadin, sub theraputic   SSS s/p permament pacemaker  Severe MR secondary to partial flail posterior MVleaflet - ECHO on this admission does not reflect this, so Dr. Claiborne Billings will review himself. AS with 42 mm peak and 23 mm mean gradient.   Prostate CA - stable   Bronchitis/Pneumonia- s/p IV abx. Resolving. Per CCM. Discharge today  Acute CHF  -- Ramipril decreased to 5 mg yesterday per Dr. Claiborne Billings -- neg 285 ml, weight down 4 lbs.  D/C today. Appt with Dr. Claiborne Billings in 3 months  Perry Mount PA-C  [facility]    10/18/2013, 9:14 AM

## 2013-10-19 ENCOUNTER — Emergency Department (HOSPITAL_COMMUNITY): Payer: Medicare HMO

## 2013-10-19 ENCOUNTER — Inpatient Hospital Stay (HOSPITAL_COMMUNITY): Payer: Medicare HMO

## 2013-10-19 ENCOUNTER — Encounter (HOSPITAL_COMMUNITY): Payer: Self-pay | Admitting: Emergency Medicine

## 2013-10-19 ENCOUNTER — Observation Stay (HOSPITAL_COMMUNITY)
Admission: EM | Admit: 2013-10-19 | Discharge: 2013-10-20 | Disposition: A | Discharge status: Home / Self Care | Payer: Medicare HMO | Attending: Internal Medicine | Admitting: Internal Medicine

## 2013-10-19 DIAGNOSIS — I272 Pulmonary hypertension, unspecified: Secondary | ICD-10-CM

## 2013-10-19 DIAGNOSIS — I10 Essential (primary) hypertension: Secondary | ICD-10-CM

## 2013-10-19 DIAGNOSIS — R404 Transient alteration of awareness: Secondary | ICD-10-CM

## 2013-10-19 DIAGNOSIS — R55 Syncope and collapse: Secondary | ICD-10-CM

## 2013-10-19 DIAGNOSIS — J45909 Unspecified asthma, uncomplicated: Secondary | ICD-10-CM

## 2013-10-19 DIAGNOSIS — G459 Transient cerebral ischemic attack, unspecified: Principal | ICD-10-CM

## 2013-10-19 DIAGNOSIS — I4891 Unspecified atrial fibrillation: Secondary | ICD-10-CM

## 2013-10-19 DIAGNOSIS — I059 Rheumatic mitral valve disease, unspecified: Secondary | ICD-10-CM | POA: Insufficient documentation

## 2013-10-19 DIAGNOSIS — I2510 Atherosclerotic heart disease of native coronary artery without angina pectoris: Secondary | ICD-10-CM

## 2013-10-19 DIAGNOSIS — I340 Nonrheumatic mitral (valve) insufficiency: Secondary | ICD-10-CM

## 2013-10-19 DIAGNOSIS — I252 Old myocardial infarction: Secondary | ICD-10-CM | POA: Insufficient documentation

## 2013-10-19 DIAGNOSIS — Z7982 Long term (current) use of aspirin: Secondary | ICD-10-CM | POA: Insufficient documentation

## 2013-10-19 DIAGNOSIS — E785 Hyperlipidemia, unspecified: Secondary | ICD-10-CM

## 2013-10-19 DIAGNOSIS — Z950 Presence of cardiac pacemaker: Secondary | ICD-10-CM

## 2013-10-19 DIAGNOSIS — C61 Malignant neoplasm of prostate: Secondary | ICD-10-CM

## 2013-10-19 DIAGNOSIS — Z9861 Coronary angioplasty status: Secondary | ICD-10-CM | POA: Insufficient documentation

## 2013-10-19 DIAGNOSIS — Z7901 Long term (current) use of anticoagulants: Secondary | ICD-10-CM

## 2013-10-19 DIAGNOSIS — I2789 Other specified pulmonary heart diseases: Secondary | ICD-10-CM | POA: Insufficient documentation

## 2013-10-19 DIAGNOSIS — R4189 Other symptoms and signs involving cognitive functions and awareness: Secondary | ICD-10-CM

## 2013-10-19 DIAGNOSIS — N179 Acute kidney failure, unspecified: Secondary | ICD-10-CM | POA: Insufficient documentation

## 2013-10-19 DIAGNOSIS — Z792 Long term (current) use of antibiotics: Secondary | ICD-10-CM | POA: Insufficient documentation

## 2013-10-19 DIAGNOSIS — J9690 Respiratory failure, unspecified, unspecified whether with hypoxia or hypercapnia: Secondary | ICD-10-CM

## 2013-10-19 HISTORY — DX: Cardiac arrhythmia, unspecified: I49.9

## 2013-10-19 HISTORY — DX: Presence of cardiac pacemaker: Z95.0

## 2013-10-19 LAB — COMPREHENSIVE METABOLIC PANEL
ALT: 23 U/L (ref 0–53)
AST: 28 U/L (ref 0–37)
Albumin: 3.4 g/dL — ABNORMAL LOW (ref 3.5–5.2)
Alkaline Phosphatase: 42 U/L (ref 39–117)
BILIRUBIN TOTAL: 1.1 mg/dL (ref 0.3–1.2)
BUN: 46 mg/dL — AB (ref 6–23)
CHLORIDE: 100 meq/L (ref 96–112)
CO2: 27 mEq/L (ref 19–32)
CREATININE: 1.36 mg/dL — AB (ref 0.50–1.35)
Calcium: 9.4 mg/dL (ref 8.4–10.5)
GFR calc non Af Amer: 47 mL/min — ABNORMAL LOW (ref >90)
GFR, EST AFRICAN AMERICAN: 54 mL/min — AB (ref >90)
GLUCOSE: 127 mg/dL — AB (ref 70–99)
POTASSIUM: 4.4 meq/L (ref 3.7–5.3)
Sodium: 138 mEq/L (ref 137–147)
Total Protein: 6.4 g/dL (ref 6.0–8.3)

## 2013-10-19 LAB — RAPID URINE DRUG SCREEN, HOSP PERFORMED
Amphetamines: NOT DETECTED
BENZODIAZEPINES: NOT DETECTED
Barbiturates: NOT DETECTED
COCAINE: NOT DETECTED
Opiates: NOT DETECTED
Tetrahydrocannabinol: NOT DETECTED

## 2013-10-19 LAB — DIFFERENTIAL
BASOS PCT: 0 % (ref 0–1)
Basophils Absolute: 0 10*3/uL (ref 0.0–0.1)
Eosinophils Absolute: 0 10*3/uL (ref 0.0–0.7)
Eosinophils Relative: 0 % (ref 0–5)
LYMPHS ABS: 1.7 10*3/uL (ref 0.7–4.0)
LYMPHS PCT: 18 % (ref 12–46)
Monocytes Absolute: 1.2 10*3/uL — ABNORMAL HIGH (ref 0.1–1.0)
Monocytes Relative: 12 % (ref 3–12)
Neutro Abs: 6.7 10*3/uL (ref 1.7–7.7)
Neutrophils Relative %: 70 % (ref 43–77)

## 2013-10-19 LAB — CBC
HCT: 40 % (ref 39.0–52.0)
HEMOGLOBIN: 14.2 g/dL (ref 13.0–17.0)
MCH: 32.6 pg (ref 26.0–34.0)
MCHC: 35.5 g/dL (ref 30.0–36.0)
MCV: 91.7 fL (ref 78.0–100.0)
Platelets: 192 10*3/uL (ref 150–400)
RBC: 4.36 MIL/uL (ref 4.22–5.81)
RDW: 14.3 % (ref 11.5–15.5)
WBC: 9.6 10*3/uL (ref 4.0–10.5)

## 2013-10-19 LAB — URINALYSIS, ROUTINE W REFLEX MICROSCOPIC
BILIRUBIN URINE: NEGATIVE
GLUCOSE, UA: NEGATIVE mg/dL
Hgb urine dipstick: NEGATIVE
Ketones, ur: NEGATIVE mg/dL
LEUKOCYTES UA: NEGATIVE
Nitrite: NEGATIVE
PH: 6.5 (ref 5.0–8.0)
Protein, ur: NEGATIVE mg/dL
Specific Gravity, Urine: 1.02 (ref 1.005–1.030)
Urobilinogen, UA: 1 mg/dL (ref 0.0–1.0)

## 2013-10-19 LAB — I-STAT CHEM 8, ED
BUN: 41 mg/dL — AB (ref 6–23)
CALCIUM ION: 1.18 mmol/L (ref 1.13–1.30)
Chloride: 98 mEq/L (ref 96–112)
Creatinine, Ser: 1.5 mg/dL — ABNORMAL HIGH (ref 0.50–1.35)
Glucose, Bld: 123 mg/dL — ABNORMAL HIGH (ref 70–99)
HEMATOCRIT: 41 % (ref 39.0–52.0)
Hemoglobin: 13.9 g/dL (ref 13.0–17.0)
Potassium: 4.1 mEq/L (ref 3.7–5.3)
SODIUM: 139 meq/L (ref 137–147)
TCO2: 28 mmol/L (ref 0–100)

## 2013-10-19 LAB — I-STAT TROPONIN, ED: Troponin i, poc: 0.02 ng/mL (ref 0.00–0.08)

## 2013-10-19 LAB — APTT: APTT: 32 s (ref 24–37)

## 2013-10-19 LAB — CBG MONITORING, ED: Glucose-Capillary: 114 mg/dL — ABNORMAL HIGH (ref 70–99)

## 2013-10-19 LAB — ETHANOL

## 2013-10-19 LAB — PROTIME-INR
INR: 1.96 — ABNORMAL HIGH (ref 0.00–1.49)
Prothrombin Time: 21.7 seconds — ABNORMAL HIGH (ref 11.6–15.2)

## 2013-10-19 MED ORDER — WARFARIN - PHARMACIST DOSING INPATIENT
Freq: Every day | Status: DC
  Administered 2013-10-20: 18:00:00

## 2013-10-19 MED ORDER — PREDNISONE 20 MG PO TABS
40.0000 mg | ORAL_TABLET | Freq: Every day | ORAL | Status: DC
  Administered 2013-10-20: 40 mg via ORAL
  Filled 2013-10-19 (×2): qty 2

## 2013-10-19 MED ORDER — RAMIPRIL 5 MG PO CAPS
5.0000 mg | ORAL_CAPSULE | Freq: Every day | ORAL | Status: DC
  Filled 2013-10-19: qty 1

## 2013-10-19 MED ORDER — SENNOSIDES-DOCUSATE SODIUM 8.6-50 MG PO TABS: 1.0000 | ORAL_TABLET | Freq: Every evening | ORAL | Status: DC | PRN

## 2013-10-19 MED ORDER — ONDANSETRON HCL 4 MG/2ML IJ SOLN: 4.0000 mg | Freq: Four times a day (QID) | INTRAMUSCULAR | Status: DC | PRN

## 2013-10-19 MED ORDER — NEBIVOLOL HCL 5 MG PO TABS
5.0000 mg | ORAL_TABLET | Freq: Every day | ORAL | Status: DC
  Administered 2013-10-20: 5 mg via ORAL
  Filled 2013-10-19 (×2): qty 1

## 2013-10-19 MED ORDER — ASPIRIN 81 MG PO TABS: 81.0000 mg | ORAL_TABLET | Freq: Every day | ORAL | Status: DC

## 2013-10-19 MED ORDER — SIMVASTATIN 20 MG PO TABS
20.0000 mg | ORAL_TABLET | ORAL | Status: DC
  Administered 2013-10-20: 20 mg via ORAL
  Filled 2013-10-19 (×2): qty 1

## 2013-10-19 MED ORDER — ASPIRIN 81 MG PO CHEW
81.0000 mg | CHEWABLE_TABLET | Freq: Every day | ORAL | Status: DC
  Administered 2013-10-20: 81 mg via ORAL
  Filled 2013-10-19: qty 1

## 2013-10-19 MED ORDER — LEVOFLOXACIN 750 MG PO TABS
750.0000 mg | ORAL_TABLET | ORAL | Status: DC
  Filled 2013-10-19: qty 1

## 2013-10-19 MED ORDER — SODIUM CHLORIDE 0.9 % IV BOLUS (SEPSIS)
500.0000 mL | Freq: Once | INTRAVENOUS | Status: AC
  Administered 2013-10-19: 500 mL via INTRAVENOUS

## 2013-10-19 MED ORDER — HYDROCODONE-HOMATROPINE 5-1.5 MG/5ML PO SYRP: 5.0000 mL | ORAL_SOLUTION | Freq: Four times a day (QID) | ORAL | Status: DC | PRN

## 2013-10-19 MED ORDER — WARFARIN SODIUM 5 MG PO TABS
5.0000 mg | ORAL_TABLET | ORAL | Status: DC
  Filled 2013-10-19: qty 1

## 2013-10-19 MED ORDER — FUROSEMIDE 20 MG PO TABS
20.0000 mg | ORAL_TABLET | Freq: Every day | ORAL | Status: DC
  Administered 2013-10-20: 20 mg via ORAL
  Filled 2013-10-19 (×2): qty 1

## 2013-10-19 NOTE — ED Notes (Signed)
To ED from via [facility] as a Code Stroke, wife reports LSN 1420, sts pt collapsed with snoring resps and had acute, right sided weakness, per EMS, pt is improving on arrival, meet at bridge by EDP, neuro MD, lab, RR RN and ED RN

## 2013-10-19 NOTE — ED Provider Notes (Signed)
I saw and evaluated the patient, reviewed the resident's note and I agree with the findings and plan.   EKG Interpretation   Date/Time:  Tuesday Oct 19 2013 15:07:20 EDT Ventricular Rate:  76 PR Interval:  152 QRS Duration: 166 QT Interval:  440 QTC Calculation: 495 R Axis:   -80 Text Interpretation:  VENTRICULAR PACED RHYTHM similar to prior Confirmed  by Walida Cajas  MD, [place] ([id number]) on 10/19/2013 3:14:59 PM      Gen:  AOx2, confused at times HEENT: Normocephalic, atraumatic Card:  RRR, no m/r/g Pulm: CTAB Abd: Soft, non-tender Neuro:  Right-sided neglect, 4/5 strength in the right upper extremity, dysarthria with aphasia noted  Patient evaluated as a good strength. Episode and collapse with right-sided weakness that appears to be improving. Neurology is at the bedside. Patient to CT scan without evidence of acute bleed. Patient is currently on Coumadin. Dr. Nicole Kindred recommending TIA workup. Patient is on Coumadin and is not a candidate for TPA. Will also workup for syncope. Patient to be admitted.    Merryl Hacker, MD 10/19/13 1536

## 2013-10-19 NOTE — ED Provider Notes (Signed)
CSN: [id number]     Arrival date & time 10/19/13  1455 History   First MD Initiated Contact with Patient 10/19/13 1458     Chief Complaint  Patient presents with  . Code Stroke     (Consider location/radiation/quality/duration/timing/severity/associated sxs/prior Treatment) The history is provided by the spouse.   history of present illness: 80 year old male who presented to the emergency department as a code stroke. Wife reported he was last seen normal approximately 35 minutes prior to arrival. She is stepped out of the room and when she came back in the room she, patient slept on the couch and was unresponsive. On EMS arrival they report the patient was breathing spontaneously and was not responsive to voice or touch. They report symptoms have improved in route. Patient noted in route now to open eyes to voice and has been moving the left upper and lower extremity. They have seen minimal movement of the right upper right lower extremity. Patient discharged from the hospital yesterday after being admitted for bronchitis. Discharge home on Levaquin and prednisone. Family reports that his symptoms had completely resolved and patient was without complaints this morning prior to the episode.  Past Medical History  Diagnosis Date  . Prostate cancer   . CAD (coronary artery disease)     Circumflex MI 1995, Last cath 2008  . Mitral regurgitation     Severe  . HLD (hyperlipidemia)   . HTN (hypertension)   . Atrial fibrillation   . Pulmonary hypertension   . Presence of permanent cardiac pacemaker 10/16/2006,09/04/12    Medtronic adapta  . Chronic anticoagulation    Past Surgical History  Procedure Laterality Date  . Permanent pacemaker insertion  10/16/2006    Medtronic Enrhythm  . Permanent pacemaker generator change  09/04/2012    Medtronic adapta  . Coronary angioplasty  09/16/1993    100% CX-successful  . Cardiac catheterization  05/11/2002    70-80% small first diagonal,50% RCA  . Cardiac  catheterization  10/14/2006    multivessell,EF 40-50%  . Nm myoview ltd  08/30/2009    mod. perfusion defect-mid inferior,apical inferior,basal inferolateral,mid inferolateral & apical lateral regions  . US echocardiography  03/27/2012    severe MR persists w/progressive LA dilatation,RSVP has increased & AS has progressed   Family History  Problem Relation Age of Onset  . Diabetes Sister    History  Substance Use Topics  . Smoking status: Never Smoker   . Smokeless tobacco: Never Used  . Alcohol Use: No    Review of Systems  Unable to perform ROS: Mental status change      Allergies  Codeine  Home Medications   Prior to Admission medications   Medication Sig Start Date End Date Taking? Authorizing Provider  aspirin 81 MG tablet Take 81 mg by mouth daily.    Historical Provider, MD  furosemide (LASIX) 20 MG tablet Take 20 mg by mouth daily as needed for fluid.    Historical Provider, MD  HYDROcodone-homatropine (HYCODAN) 5-1.5 MG/5ML syrup Take 5 mLs by mouth every 6 (six) hours as needed for cough. 10/18/13   Grace Bushy Minor, NP  levofloxacin (LEVAQUIN) 750 MG tablet Take 1 tablet (750 mg total) by mouth every other day. 10/19/13   Grace Bushy Minor, NP  mometasone-formoterol (DULERA) 100-5 MCG/ACT AERO Inhale 2 puffs into the lungs 2 (two) times daily. 10/18/13   Grace Bushy Minor, NP  Multiple Vitamin (MULTIVITAMIN WITH MINERALS) TABS Take 1 tablet by mouth daily.    Historical  Provider, MD  nebivolol (BYSTOLIC) 5 MG tablet Take 5 mg by mouth daily.    Historical Provider, MD  predniSONE (DELTASONE) 10 MG tablet Take 4 tabs  daily with food x 4 days, then 3 tabs daily x 4 days, then 2 tabs daily x 4 days, then 1 tab daily x4 days then stop. #40 10/18/13   Grace Bushy Minor, NP  ramipril (ALTACE) 5 MG capsule Take 1 capsule (5 mg total) by mouth daily. 10/18/13   Grace Bushy Minor, NP  simvastatin (ZOCOR) 20 MG tablet Take 20 mg by mouth every morning.    Historical Provider, MD  warfarin  (COUMADIN) 5 MG tablet Take 5 mg by mouth every morning. 2.5mg  Monday, Wednesday, and Friday.  5mg  all other days    Historical Provider, MD   BP 117/81  Pulse 70  Temp(Src) 97.9 F (36.6 C) (Oral)  Resp 16  Ht 6' 2.02" (1.88 m)  Wt 170 lb 13.7 oz (77.5 kg)  BMI 21.93 kg/m2  SpO2 96% Physical Exam  Nursing note and vitals reviewed. Constitutional: He appears well-developed and well-nourished. No distress.  HENT:  Head: Normocephalic and atraumatic.  Eyes: Conjunctivae are normal.  Neck: Neck supple.  Cardiovascular: Normal rate, regular rhythm, normal heart sounds and intact distal pulses.   Pulmonary/Chest: Effort normal and breath sounds normal. He has no wheezes. He has no rales.  Abdominal: Soft. He exhibits no distension. There is no tenderness.  Musculoskeletal: Normal range of motion.  Neurological: He is alert. GCS eye subscore is 4. GCS verbal subscore is 4. GCS motor subscore is 6.  Right sided neglect  Skin: Skin is warm and dry.    ED Course  Procedures (including critical care time) Labs Review Labs Reviewed  PROTIME-INR - Abnormal; Notable for the following:    Prothrombin Time 21.7 (*)    INR 1.96 (*)    All other components within normal limits  DIFFERENTIAL - Abnormal; Notable for the following:    Monocytes Absolute 1.2 (*)    All other components within normal limits  COMPREHENSIVE METABOLIC PANEL - Abnormal; Notable for the following:    Glucose, Bld 127 (*)    BUN 46 (*)    Creatinine, Ser 1.36 (*)    Albumin 3.4 (*)    GFR calc non Af Amer 47 (*)    GFR calc Af Amer 54 (*)    All other components within normal limits  URINALYSIS, ROUTINE W REFLEX MICROSCOPIC - Abnormal; Notable for the following:    APPearance CLOUDY (*)    All other components within normal limits  I-STAT CHEM 8, ED - Abnormal; Notable for the following:    BUN 41 (*)    Creatinine, Ser 1.50 (*)    Glucose, Bld 123 (*)    All other components within normal limits  CBG  MONITORING, ED - Abnormal; Notable for the following:    Glucose-Capillary 114 (*)    All other components within normal limits  ETHANOL  APTT  CBC  URINE RAPID DRUG SCREEN (HOSP PERFORMED)  HEMOGLOBIN A1C  LIPID PANEL  PROTIME-INR  I-STAT TROPOININ, ED  Randolm Idol, ED    Imaging Review Dg Chest 2 View  10/19/2013   CLINICAL DATA:  Stroke.  EXAM: CHEST  2 VIEW  COMPARISON:  Chest x-ray from the same day at 4:01 p.m.  FINDINGS: Dual-chamber left approach pacer is unremarkable and unchanged.  Mild cardiomegaly. No acute mediastinal contour abnormality. Mitral annular calcification. No edema, consolidation, effusion,  or pneumothorax. Generous lung volumes, which may reflect COPD.  IMPRESSION: No active cardiopulmonary disease.   Electronically Signed   By: Jorje Guild M.D.   On: 10/19/2013 23:12   Ct Head Wo Contrast  10/19/2013   CLINICAL DATA:  Unable to answer questions.  Weakness.  Code stroke.  EXAM: CT HEAD WITHOUT CONTRAST  TECHNIQUE: Contiguous axial images were obtained from the base of the skull through the vertex without contrast.  COMPARISON:  None  FINDINGS: Generalized cerebral and cerebellar atrophy. Chronic microvascular ischemic change. No evidence for acute infarction, hemorrhage, mass lesion, hydrocephalus, or extra-axial fluid. Calvarium intact. Carotid, vertebral, and basilar calcification. Chronic right maxillary sinus disease. No mastoid fluid. Bilateral external ear canal cerumen. Right cataract extraction.  IMPRESSION: Generalized atrophy.  No acute intracranial findings.  Critical Value/emergent results were called by telephone at the time of interpretation on 10/19/2013 at 3:09 PM to Dr. Wallie Char who verbally acknowledged these results.   Electronically Signed   By: Rolla Flatten M.D.   On: 10/19/2013 15:10   Dg Chest Portable 1 View  10/19/2013   CLINICAL DATA:  Code stroke  EXAM: PORTABLE CHEST - 1 VIEW  COMPARISON:  10/16/2013  FINDINGS: Cardiomegaly again  noted. Mild interstitial prominence bilaterally without convincing pulmonary edema. Dual lead cardiac pacemaker is unchanged in position. No segmental infiltrate.  IMPRESSION: Cardiomegaly. Stable dual lead cardiac pacemaker. Mild interstitial prominence without pulmonary edema. No focal infiltrate.   Electronically Signed   By: Lahoma Crocker M.D.   On: 10/19/2013 16:30     EKG Interpretation   Date/Time:  Tuesday Oct 19 2013 15:07:20 EDT Ventricular Rate:  76 PR Interval:  152 QRS Duration: 166 QT Interval:  440 QTC Calculation: 495 R Axis:   -80 Text Interpretation:  VENTRICULAR PACED RHYTHM similar to prior Confirmed  by HORTON  MD, Loma Sousa ([id number]) on 10/19/2013 3:14:59 PM      MDM   Final diagnoses:  Unresponsive episode    80 year old male with history of coronary artery disease, atrial fibrillation, chronic anticoagulation, pacemaker in place who presented via EMS as a code stroke after an unresponsive episode and concern for right-sided deficits and neglect. Symptoms improving in route. On arrival concern for right-sided neglect. AF VSS.  CT head showed no acute infarct. Patient on Coumadin so as not a TPA candidate. Pacemaker was interrogated and showed no acute events. Troponin negative.  Symptoms continue to improve in the emergency department. Neurology evaluated the patient and recommended admission for TIA workup.  Hospitalist consulted and will admit for further evaluation.  Renaldo Reel, MD 10/20/13 0100

## 2013-10-19 NOTE — Progress Notes (Cosign Needed)
Patient refused all meds for 1930, stating that he had already had them all earlier.  Will continue to monitor.

## 2013-10-19 NOTE — H&P (Signed)
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 The history obtained from wife at bedside. She states that she had gone into the kitchen and when she came back she found him on sofa slumped over and unresponsive. No reports of incontinence or seizure-like activity. Also denies slurred speech dysphagia. Per EMS/EDP he was noted to have right-sided weakness following that although wife states that it was the left sided that was weaker. Patient's symptoms are resolved while in the ED. He denies any weakness at this time. Patient also denies chest pain, shortness of breath and no dizziness. He was seen in the ED per neuro and admission for possible TIA requested. CT head in the ED showed no acute findings. His pacemaker was interrogated in the ED and within normal limits    Review of Systems The patient denies anorexia, fever, weight loss,, vision loss, decreased hearing, hoarseness, chest pain, dyspnea on exertion, peripheral edema, balance deficits, hemoptysis, abdominal pain, melena, hematochezia, severe indigestion/heartburn, hematuria, incontinence, genital sores, depression, unusual weight change, abnormal bleeding, enlarged lymph nodes, angioedema, and breast masses.   Past Medical History  Diagnosis Date  . Prostate cancer   . CAD (coronary artery disease)     Circumflex MI 1995, Last cath 2008  . Mitral regurgitation     Severe  . HLD (hyperlipidemia)   . HTN (hypertension)   . Atrial  fibrillation   . Pulmonary hypertension   . Presence of permanent cardiac pacemaker 10/16/2006,09/04/12    Medtronic adapta  . Chronic anticoagulation    Past Surgical History  Procedure Laterality Date  . Permanent pacemaker insertion  10/16/2006    Medtronic Enrhythm  . Permanent pacemaker generator change  09/04/2012    Medtronic adapta  . Coronary angioplasty  09/16/1993    100% CX-successful  . Cardiac catheterization  05/11/2002    70-80% small first diagonal,50% RCA  . Cardiac catheterization  10/14/2006    multivessell,EF 40-50%  . Nm myoview ltd  08/30/2009    mod. perfusion defect-mid inferior,apical inferior,basal inferolateral,mid inferolateral & apical lateral regions  . US echocardiography  03/27/2012    severe MR persists w/progressive LA dilatation,RSVP has increased & AS has progressed   Social History:  reports that he has never smoked. He has never used smokeless tobacco. He reports that he does not drink alcohol. His drug history is not on file.  Allergies  Allergen Reactions  . Codeine Nausea And Vomiting    Family History  Problem Relation Age of Onset  . Diabetes Sister      Prior to Admission medications   Medication Sig Start Date End Date Taking? Authorizing Provider  aspirin 81 MG tablet Take 81 mg by mouth daily.   Yes Historical Provider, MD  furosemide (LASIX) 20 MG tablet Take 20 mg by mouth daily.    Yes Historical Provider, MD  HYDROcodone-homatropine (HYCODAN) 5-1.5 MG/5ML syrup Take 5 mLs by mouth every 6 (six) hours as needed for cough. 10/18/13  Yes Grace Bushy Minor, NP  levofloxacin (LEVAQUIN) 750 MG tablet Take 1 tablet (750 mg total) by mouth every other day.  10/19/13  Yes William S Minor, NP  nebivolol (BYSTOLIC) 5 MG tablet Take 5 mg by mouth daily.   Yes Historical Provider, MD  predniSONE (DELTASONE) 10 MG tablet Take 4 tabs  daily with food x 4 days, then 3 tabs daily x 4 days, then 2 tabs daily x 4 days, then 1 tab daily x4 days then stop. #40  10/18/13  Yes Grace Bushy Minor, NP  ramipril (ALTACE) 5 MG capsule Take 1 capsule (5 mg total) by mouth daily. 10/18/13  Yes Grace Bushy Minor, NP  simvastatin (ZOCOR) 20 MG tablet Take 20 mg by mouth every morning.   Yes Historical Provider, MD  warfarin (COUMADIN) 5 MG tablet Take 5 mg by mouth every morning. 2.5mg  Monday, Wednesday, and Friday.  5mg  all other days   Yes Historical Provider, MD   Physical Exam: Filed Vitals:   10/19/13 1815  BP: 123/96  Pulse: 75  Temp:   Resp: 21    BP 123/96  Pulse 75  Temp(Src) 97.4 F (36.3 C)  Resp 21  SpO2 98% Constitutional: Vital signs reviewed.  Patient is a well-developed and well-nourished  in no acute distress and cooperative with exam. Alert and oriented x2. No facial asymmetry Nose: No erythema or drainage noted.  Turbinates normal Mouth: no erythema or exudates, MMM Eyes: PERRL, EOMI, conjunctivae normal, No scleral icterus.  Neck: Supple, Trachea midline normal ROM, No JVD, mass, thyromegaly, or carotid bruit present.  Cardiovascular: Regular, rate controlled, S1 normal, S2 normal, pulses symmetric and intact bilaterally Pulmonary/Chest: normal respiratory effort, CTAB, no wheezes, rales, or rhonchi Abdominal: Soft. Non-tender, non-distended, bowel sounds are normal, no masses, organomegaly, or guarding present.  GU: no CVA tenderness  extremities: No cyanosis and no edema  Neurological: A&O x2, Strength is normal and symmetric bilaterally, cranial nerve II-XII are grossly intact, no focal motor deficit, sensory intact to light touch bilaterally.  Skin: Warm, dry and intact. No rash,.  Psychiatric: Normal mood and affect. Speech is normal.              Labs on Admission:  Basic Metabolic Panel:  Recent Labs Lab 10/16/13 0345 10/16/13 0832 10/17/13 0455 10/19/13 1458 10/19/13 1502  NA 136*  --  139 138 139  K 4.2  --  4.8 4.4 4.1  CL 94*  --  98 100 98  CO2 26  --  29 27  --   GLUCOSE 144*  --  145* 127* 123*  BUN 20   --  32* 46* 41*  CREATININE 1.12 1.52* 1.43* 1.36* 1.50*  CALCIUM 9.4  --  9.8 9.4  --    Liver Function Tests:  Recent Labs Lab 10/16/13 0345 10/19/13 1458  AST 20 28  ALT 12 23  ALKPHOS 62 42  BILITOT 1.8* 1.1  PROT 7.7 6.4  ALBUMIN 4.2 3.4*   No results found for this basename: LIPASE, AMYLASE,  in the last 168 hours No results found for this basename: AMMONIA,  in the last 168 hours CBC:  Recent Labs Lab 10/16/13 0345 10/16/13 0832 10/17/13 0455 10/19/13 1458 10/19/13 1502  WBC 14.5* 10.6* 9.8 9.6  --   NEUTROABS  --   --   --  6.7  --   HGB 15.8 13.3 13.2 14.2 13.9  HCT 44.2 38.6* 38.6* 40.0 41.0  MCV 93.4 93.7 93.9 91.7  --   PLT 174 153 142* 192  --    Cardiac Enzymes:  Recent Labs Lab 10/16/13 1305 10/16/13 1450  10/16/13 1810 10/16/13 2047 10/17/13 0455  CKTOTAL  --  49  --   --   --   CKMB  --  5.3*  --   --   --   TROPONINI 0.65* 0.68* 0.44* 0.39* <0.30    BNP (last 3 results)  Recent Labs  10/16/13 0345 10/17/13 0455  PROBNP 3137.0* 7399.0*   CBG:  Recent Labs Lab 10/16/13 0817 10/16/13 1110 10/16/13 1613 10/19/13 1509  GLUCAP 142* 133* 129* 114*    Radiological Exams on Admission: Ct Head Wo Contrast  10/19/2013   CLINICAL DATA:  Unable to answer questions.  Weakness.  Code stroke.  EXAM: CT HEAD WITHOUT CONTRAST  TECHNIQUE: Contiguous axial images were obtained from the base of the skull through the vertex without contrast.  COMPARISON:  None  FINDINGS: Generalized cerebral and cerebellar atrophy. Chronic microvascular ischemic change. No evidence for acute infarction, hemorrhage, mass lesion, hydrocephalus, or extra-axial fluid. Calvarium intact. Carotid, vertebral, and basilar calcification. Chronic right maxillary sinus disease. No mastoid fluid. Bilateral external ear canal cerumen. Right cataract extraction.  IMPRESSION: Generalized atrophy.  No acute intracranial findings.  Critical Value/emergent results were called by telephone  at the time of interpretation on 10/19/2013 at 3:09 PM to Dr. Wallie Char who verbally acknowledged these results.   Electronically Signed   By: Rolla Flatten M.D.   On: 10/19/2013 15:10   Dg Chest Portable 1 View  10/19/2013   CLINICAL DATA:  Code stroke  EXAM: PORTABLE CHEST - 1 VIEW  COMPARISON:  10/16/2013  FINDINGS: Cardiomegaly again noted. Mild interstitial prominence bilaterally without convincing pulmonary edema. Dual lead cardiac pacemaker is unchanged in position. No segmental infiltrate.  IMPRESSION: Cardiomegaly. Stable dual lead cardiac pacemaker. Mild interstitial prominence without pulmonary edema. No focal infiltrate.   Electronically Signed   By: Lahoma Crocker M.D.   On: 10/19/2013 16:30    EKG: Independently reviewed. Paced ventricular rhythm  Assessment/Plan Active Problems:   Unresponsive episode/TIA (transient ischemic attack) -As discussed above will admit for TIA workup  -Will obtain 2-D echo, carotid Dopplers.CT head is negative for acute findings -He has a pacemaker so MRI cannot be done, Neuro to follow up for recommendations for further workup >> repeat CT versus CTangio   Asthmatic bronchitis - continue Levaquin - will continue her prednisone follow and taper as was directed per CCM on discharge yesterday   HTN (hypertension) -Continue outpatient medications    A-fib  -status post pacemaker  -Continue Coumadin per pharmacy, INR is 1.8 for today-follow        Code Status: Full Family Communication: Wife at bedside Disposition Plan: Admit to neuro telemetry  Time spent: >30  [facility] Pager [phone]

## 2013-10-19 NOTE — ED Notes (Addendum)
Family at bedside. Dr. Modena Nunnery at the bedside to speak with family.

## 2013-10-19 NOTE — ED Provider Notes (Signed)
MSE was initiated and I personally evaluated the patient and placed orders (if any) at  3:02 PM on Oct 19, 2013.  Assessed as code stroke on arrival. Reported collapsed with snoring respirations and right eye weakness. Her EMS, improved. Patient is currently came in and  Has history of CAD and CHF. He's awake and alert at this time. Following simple commands. Appears to have right sided neglect.    To CT with neurology.  Merryl Hacker, MD 10/19/13 [phone]

## 2013-10-19 NOTE — Code Documentation (Addendum)
80  Yo male who was at home with his wife when she noted  Him to slump over at 1420. EMS was called and found the pt to be unresponsive.code stroke was activated and pt arrived at [facility] at 1434. On arrival he was cleared for CT which showed no acute abnormality. He was taken to Trauma C where NIHSS was scored as 4 (see doc flowsheet). Pt is alert,  attempts to answer questions, has some appropriate words and some word salad. He is unable to tell us his last name, the month, or his age, but he does follow commands though response is delayed.  He has greatly improved from what the paramedics found; recognizes his son. Paced rhythm...pacer was interrogated.  SBP only 130; NS bolus started. O2 at 2 liters to keep sats 94-95%. INR=1.96 on coumadin.  Not eligible for acute intervention. Handoff done with ED RN.

## 2013-10-19 NOTE — Progress Notes (Signed)
ANTICOAGULATION CONSULT NOTE - Initial Consult  Pharmacy Consult:  Coumadin Indication: atrial fibrillation  Allergies  Allergen Reactions  . Codeine Nausea And Vomiting    Patient Measurements: Height: 6' 2.02" (188 cm) Weight: 170 lb 13.7 oz (77.5 kg) IBW/kg (Calculated) : 82.24  Vital Signs: Temp: 97.4 F (36.3 C) (05/05 1556) BP: 123/96 mmHg (05/05 1815) Pulse Rate: 75 (05/05 1815)  Labs:  Recent Labs  10/16/13 2047  10/17/13 0455 10/18/13 0535 10/19/13 1458 10/19/13 1502  HGB  --   < > 13.2  --  14.2 13.9  HCT  --   --  38.6*  --  40.0 41.0  PLT  --   --  142*  --  192  --   APTT  --   --   --   --  32  --   LABPROT 20.0*  --  19.4* 20.7* 21.7*  --   INR 1.76*  --  1.69* 1.84* 1.96*  --   CREATININE  --   --  1.43*  --  1.36* 1.50*  TROPONINI 0.39*  --  <0.30  --   --   --   < > = values in this interval not displayed.  Estimated Creatinine Clearance: 41.6 ml/min (by C-G formula based on Cr of 1.5).   Medical History: Past Medical History  Diagnosis Date  . Prostate cancer   . CAD (coronary artery disease)     Circumflex MI 1995, Last cath 2008  . Mitral regurgitation     Severe  . HLD (hyperlipidemia)   . HTN (hypertension)   . Atrial fibrillation   . Pulmonary hypertension   . Presence of permanent cardiac pacemaker 10/16/2006,09/04/12    Medtronic adapta  . Chronic anticoagulation       Assessment: 56 YOM presented as a Code Stroke to continue on Coumadin for history of AFib.  INR slightly sub-therapeutic and patient last took his Coumadin on 10/18/13.  No bleeding on head CT.  Noted patient is started on Levaquin, which could increase the effect of Coumadin.   Goal of Therapy:  INR 2-3 Monitor platelets by anticoagulation protocol: Yes    Plan:  - Coumadin 5mg  PO today - Daily PT / INR    Shigeo Baugh D. Mina Marble, PharmD, BCPS Pager:  [phone] 10/19/2013, 7:22 PM

## 2013-10-19 NOTE — Consult Note (Signed)
Referring Physician: Dr. Dina Rich    Chief Complaint: New-onset speech difficulty following an episode of loss of consciousness.  HPI: Norman Lee is an 80 y.o. male imagery of atrial fibrillation, pacemaker placement, coronary artery disease, hypertension and hyperlipidemia, who suffered an episode of loss of consciousness at 1420 today. On awakening he was noted to have difficulty with speech with difficulty getting his words out as well as nonsensical speech content. He also appeared to not be moving his right side as well as left. Patient is on anticoagulation with Coumadin. INR today was 1.96. CT scan of his head showed no acute intracranial abnormality. Patient's deficits improved after arriving in the emergency room. His NIH stroke score was 4.  LSN: 1025: 10/19/2013 tPA Given: No: Resolving deficits rapidly; INR 1.96 MRankin: 2  Past Medical History  Diagnosis Date  . Prostate cancer   . CAD (coronary artery disease)     Circumflex MI 1995, Last cath 2008  . Mitral regurgitation     Severe  . HLD (hyperlipidemia)   . HTN (hypertension)   . Atrial fibrillation   . Pulmonary hypertension   . Presence of permanent cardiac pacemaker 10/16/2006,09/04/12    [employer] adapta  . Chronic anticoagulation     Family History  Problem Relation Age of Onset  . Diabetes Sister      Medications: I have reviewed the patient's current medications.  ROS: Unavailable.  Physical Examination: Blood pressure 115/71, pulse 70, temperature 97.4 F (36.3 C), resp. rate 18, SpO2 96.00%.  Neurologic Examination: Mental Status: Slightly lethargic, disoriented to correct month.  Speech somewhat hesitant with unintelligible speech output at times. Variable ability to follow commands. Cranial Nerves: II-Visual fields were normal. III/IV/VI-Pupils were equal and reacted. Extraocular movements were full and conjugate.    V/VII-no facial numbness and no facial  weakness. VIII-normal. X-moderately dysarthric speech. Motor: 5/5 bilaterally with normal tone and bulk Sensory: Normal throughout. Deep Tendon Reflexes: 1+ and symmetric. Plantars: Mute bilaterally Cerebellar: Normal finger-to-nose testing.  Ct Head Wo Contrast  10/19/2013   CLINICAL DATA:  Unable to answer questions.  Weakness.  Code stroke.  EXAM: CT HEAD WITHOUT CONTRAST  TECHNIQUE: Contiguous axial images were obtained from the base of the skull through the vertex without contrast.  COMPARISON:  None  FINDINGS: Generalized cerebral and cerebellar atrophy. Chronic microvascular ischemic change. No evidence for acute infarction, hemorrhage, mass lesion, hydrocephalus, or extra-axial fluid. Calvarium intact. Carotid, vertebral, and basilar calcification. Chronic right maxillary sinus disease. No mastoid fluid. Bilateral external ear canal cerumen. Right cataract extraction.  IMPRESSION: Generalized atrophy.  No acute intracranial findings.  Critical Value/emergent results were called by telephone at the time of interpretation on 10/19/2013 at 3:09 PM to Dr. Wallie Char who verbally acknowledged these results.   Electronically Signed   By: Rolla Flatten M.D.   On: 10/19/2013 15:10   Dg Chest Portable 1 View  10/19/2013   CLINICAL DATA:  Code stroke  EXAM: PORTABLE CHEST - 1 VIEW  COMPARISON:  10/16/2013  FINDINGS: Cardiomegaly again noted. Mild interstitial prominence bilaterally without convincing pulmonary edema. Dual lead cardiac pacemaker is unchanged in position. No segmental infiltrate.  IMPRESSION: Cardiomegaly. Stable dual lead cardiac pacemaker. Mild interstitial prominence without pulmonary edema. No focal infiltrate.   Electronically Signed   By: Lahoma Crocker M.D.   On: 10/19/2013 16:30    Assessment: 80 y.o. male with multiple risk factors for stroke is in and with probable left subcortical MCA territory TIA. Subcortical small vessel  stroke cannot be ruled out, however.   Stroke Risk  Factors - atrial fibrillation, hyperlipidemia and hypertension  Plan: 1. HgbA1c, fasting lipid panel 2. MRI, MRA  of the brain without contrast 3. PT consult, OT consult, Speech consult 4. Echocardiogram 5. Carotid dopplers 6. Prophylactic therapy-Anticoagulation: Coumadin 7. Risk factor modification 8. Telemetry monitoring  C.R. Nicole Kindred, MD [facility] [phone]  10/19/2013, 5:26 PM

## 2013-10-20 ENCOUNTER — Telehealth: Payer: Self-pay | Admitting: Cardiovascular Disease

## 2013-10-20 ENCOUNTER — Encounter (HOSPITAL_COMMUNITY): Payer: Self-pay | Admitting: General Practice

## 2013-10-20 DIAGNOSIS — G459 Transient cerebral ischemic attack, unspecified: Secondary | ICD-10-CM

## 2013-10-20 DIAGNOSIS — I4891 Unspecified atrial fibrillation: Secondary | ICD-10-CM | POA: Diagnosis not present

## 2013-10-20 DIAGNOSIS — R55 Syncope and collapse: Secondary | ICD-10-CM

## 2013-10-20 LAB — LIPID PANEL
Cholesterol: 106 mg/dL (ref 0–200)
HDL: 28 mg/dL — AB (ref >39)
LDL CALC: 60 mg/dL (ref 0–99)
Total CHOL/HDL Ratio: 3.8 RATIO
Triglycerides: 89 mg/dL (ref <150)
VLDL: 18 mg/dL (ref 0–40)

## 2013-10-20 LAB — HEMOGLOBIN A1C
Hgb A1c MFr Bld: 5.3 % (ref <5.7)
Mean Plasma Glucose: 105 mg/dL (ref <117)

## 2013-10-20 LAB — TROPONIN I

## 2013-10-20 LAB — PROTIME-INR
INR: 2.09 — ABNORMAL HIGH (ref 0.00–1.49)
PROTHROMBIN TIME: 22.8 s — AB (ref 11.6–15.2)

## 2013-10-20 MED ORDER — SODIUM CHLORIDE 0.9 % IV SOLN
INTRAVENOUS | Status: DC
  Administered 2013-10-20: 75 mL via INTRAVENOUS

## 2013-10-20 MED ORDER — WARFARIN SODIUM 7.5 MG PO TABS
7.5000 mg | ORAL_TABLET | Freq: Once | ORAL | Status: AC
  Administered 2013-10-20: 7.5 mg via ORAL
  Filled 2013-10-20: qty 1

## 2013-10-20 NOTE — Progress Notes (Signed)
Stroke Team Progress Note  HISTORY Norman Lee is an 80 y.o. male imagery of atrial fibrillation, pacemaker placement, coronary artery disease, hypertension and hyperlipidemia, who suffered an episode of loss of consciousness at 1420 today 10/19/2013. On awakening he was noted to have difficulty with speech with difficulty getting his words out as well as nonsensical speech content. He also appeared to not be moving his right side as well as left. Patient is on anticoagulation with Coumadin. INR today was 1.96. CT scan of his head showed no acute intracranial abnormality. Patient's deficits improved after arriving in the emergency room. His NIH stroke score was 3. Patient was not administered TPA secondary to  Resolving deficits rapidly; INR 1.96. He was admitted for further evaluation and treatment.  SUBJECTIVE No family is at the bedside.  Overall he feels his condition is completely resolved.   OBJECTIVE Most recent Vital Signs: Filed Vitals:   10/20/13 0150 10/20/13 0500 10/20/13 0509 10/20/13 0651  BP: 125/84  114/86 129/80  Pulse: 70  71 70  Temp: 97.9 F (36.6 C)  97.9 F (36.6 C) 97.9 F (36.6 C)  TempSrc: Oral  Oral Oral  Resp: 18  18 18   Height:      Weight:  77.5 kg (170 lb 13.7 oz)    SpO2: 99%  96% 95%   CBG (last 3)   Recent Labs  10/19/13 1509  GLUCAP 114*    IV Fluid Intake:     MEDICATIONS  . aspirin  81 mg Oral Daily  . furosemide  20 mg Oral Daily  . [START ON 10/21/2013] levofloxacin  750 mg Oral Q48H  . nebivolol  5 mg Oral Daily  . predniSONE  40 mg Oral Q breakfast  . simvastatin  20 mg Oral BH-q7a  . warfarin  5 mg Oral NOW  . Warfarin - Pharmacist Dosing Inpatient   Does not apply q1800   PRN:  HYDROcodone-homatropine, ondansetron (ZOFRAN) IV, senna-docusate  Diet:  Cardiac thin liquids Activity:  Bedrest DVT Prophylaxis:  warfarin  CLINICALLY SIGNIFICANT STUDIES Basic Metabolic Panel:  Recent Labs Lab 10/17/13 0455 10/19/13 1458  10/19/13 1502  NA 139 138 139  K 4.8 4.4 4.1  CL 98 100 98  CO2 29 27  --   GLUCOSE 145* 127* 123*  BUN 32* 46* 41*  CREATININE 1.43* 1.36* 1.50*  CALCIUM 9.8 9.4  --    Liver Function Tests:  Recent Labs Lab 10/16/13 0345 10/19/13 1458  AST 20 28  ALT 12 23  ALKPHOS 62 42  BILITOT 1.8* 1.1  PROT 7.7 6.4  ALBUMIN 4.2 3.4*   CBC:  Recent Labs Lab 10/17/13 0455 10/19/13 1458 10/19/13 1502  WBC 9.8 9.6  --   NEUTROABS  --  6.7  --   HGB 13.2 14.2 13.9  HCT 38.6* 40.0 41.0  MCV 93.9 91.7  --   PLT 142* 192  --    Coagulation:  Recent Labs Lab 10/17/13 0455 10/18/13 0535 10/19/13 1458 10/20/13 0517  LABPROT 19.4* 20.7* 21.7* 22.8*  INR 1.69* 1.84* 1.96* 2.09*   Cardiac Enzymes:  Recent Labs Lab 10/16/13 1305 10/16/13 1450 10/16/13 1810 10/16/13 2047 10/17/13 0455  CKTOTAL  --  49  --   --   --   CKMB  --  5.3*  --   --   --   TROPONINI 0.65* 0.68* 0.44* 0.39* <0.30   Urinalysis:  Recent Labs Lab 10/16/13 0352 10/19/13 [place]  1.023 1.020  PHURINE 5.0 6.5  GLUCOSEU NEGATIVE NEGATIVE  HGBUR MODERATE* NEGATIVE  BILIRUBINUR NEGATIVE NEGATIVE  KETONESUR NEGATIVE NEGATIVE  PROTEINUR 30* NEGATIVE  UROBILINOGEN 1.0 1.0  NITRITE NEGATIVE NEGATIVE  LEUKOCYTESUR NEGATIVE NEGATIVE   Lipid Panel    Component Value Date/Time   CHOL 106 10/20/2013 0517   TRIG 89 10/20/2013 0517   HDL 28* 10/20/2013 0517   CHOLHDL 3.8 10/20/2013 0517   VLDL 18 10/20/2013 0517   LDLCALC 60 10/20/2013 0517   HgbA1C  No results found for this basename: HGBA1C    Urine Drug Screen:     Component Value Date/Time   LABOPIA NONE DETECTED 10/19/2013 2054   COCAINSCRNUR NONE DETECTED 10/19/2013 2054   LABBENZ NONE DETECTED 10/19/2013 2054   AMPHETMU NONE DETECTED 10/19/2013 2054   THCU NONE DETECTED 10/19/2013 2054   LABBARB NONE DETECTED 10/19/2013 2054    Alcohol Level:  Recent Labs Lab 10/19/13 1458  ETH <11      CT of the brain  10/19/2013     Generalized atrophy.  No acute intracranial findings.   MRI of the brain  pacer  2D Echocardiogram  EF 55-60% with no source of embolus. Possible hypokinesis of the apical myocardium.   Carotid Doppler    CXR   10/19/2013   No active cardiopulmonary disease.    10/19/2013   Cardiomegaly. Stable dual lead cardiac pacemaker. Mild interstitial prominence without pulmonary edema. No focal infiltrate.   EKG  Ventricular pacing. For complete results please see formal report.   Therapy Recommendations no needs  Physical Exam   Neurologic Examination:  Mental Status:  Slightly lethargic, disoriented to correct month. Speech somewhat hesitant with unintelligible speech output at times. Variable ability to follow commands.  Cranial Nerves:  II-Visual fields were normal.  III/IV/VI-Pupils were equal and reacted. Extraocular movements were full and conjugate.  V/VII-no facial numbness and no facial weakness.  VIII-normal.  X-moderately dysarthric speech.  Motor: 5/5 bilaterally with normal tone and bulk  Sensory: Normal throughout.  Deep Tendon Reflexes: 1+ and symmetric.  Plantars: Mute bilaterally  Cerebellar: Normal finger-to-nose testing.   ASSESSMENT Mr. Norman Lee is a 80 y.o. male presenting with New-onset speech difficulty following an episode of loss of consciousness. Imaging confirms no acute infarct. Dx:  left brain TIA. Symptoms felt to be embolic secondary to known atrial fibrillation.  On warfarin prior to admission, INR slightly subtherapeutic on arriva at 1.96.. Now on warfarin for secondary stroke prevention and level therapeutic. Patient with no resultant neurologic deficits. Stroke work up underway.  atrial fibrillation  hypertension Hyperlipidemia, LDL 60, on aocor 20 mg daily PTA, now on zocor 2o mg daily, at goal LDL < 100 CAD Pacemaker  Hospital day # 1  TREATMENT/PLAN  Continue warfarin for secondary stroke prevention. given TIA on essentially therapeutic  warfarin, recommend change to NOAC (eliquis, renal dose) it patient and his cardiologist are agreeable.   Complete Carotids  Ok for discharge this afternoon by stroke team  Follow up Stroke Clinic at [facility] in 2 months.   Burnetta Sabin, MSN, RN, ANVP-BC, AGPCNP-BC [facility] Pager: [phone] 10/20/2013 10:07 AM  I have personally obtained a history, examined the patient, evaluated imaging results, and formulated the assessment and plan of care. I agree with the above.  Jim Like, DO Neurology-Stroke [facility] Pager: [phone]   To contact Stroke Continuity provider, please refer to [email or web address] After hours, contact General Neurology

## 2013-10-20 NOTE — Discharge Summary (Addendum)
Norman Lee, is a 80 y.o. male  DOB [birth year]  MRN [id number].  Admission date:  10/19/2013  Admitting Physician  Sheila Oats, MD  Discharge Date:  10/20/2013   Primary MD  Cletis Athens, MD  Recommendations for primary care physician for things to follow:   Monitor INR closely   Admission Diagnosis  TIA (transient ischemic attack) [435.9] A-fib [427.31] HTN (hypertension) [401.9] Unresponsive episode [780.09]   Discharge Diagnosis  TIA (transient ischemic attack) [435.9] A-fib [427.31] HTN (hypertension) [401.9] Unresponsive episode [780.09]    Syncope -Slurred Speech likely TIA  Active Problems:   Pacemaker - Medtronic Adapta 2014, leads 2008   Prostate cancer   HTN (hypertension)   A-fib   Anticoagulated on warfarin   Asthmatic bronchitis   Unresponsive episode   TIA (transient ischemic attack)   Syncope, near      Past Medical History  Diagnosis Date  . Prostate cancer   . CAD (coronary artery disease)     Circumflex MI 1995, Last cath 2008  . Mitral regurgitation     Severe  . HLD (hyperlipidemia)   . HTN (hypertension)   . Atrial fibrillation   . Pulmonary hypertension   . Presence of permanent cardiac pacemaker 10/16/2006,09/04/12    Medtronic adapta  . Chronic anticoagulation   . Dysrhythmia     ATRIAL FIBRILATION  . Pacemaker     Past Surgical History  Procedure Laterality Date  . Permanent pacemaker insertion  10/16/2006    Medtronic Enrhythm  . Permanent pacemaker generator change  09/04/2012    Medtronic adapta  . Coronary angioplasty  09/16/1993    100% CX-successful  . Cardiac catheterization  05/11/2002    70-80% small first diagonal,50% RCA  . Cardiac catheterization  10/14/2006    multivessell,EF 40-50%  . Nm myoview ltd  08/30/2009    mod. perfusion defect-mid inferior,apical  inferior,basal inferolateral,mid inferolateral & apical lateral regions  . US echocardiography  03/27/2012    severe MR persists w/progressive LA dilatation,RSVP has increased & AS has progressed     Discharge Condition: stable   Follow UP  Follow-up Information   Follow up with SETHI,PRAMODKUMAR P, MD. Schedule an appointment as soon as possible for a visit in 2 months. (Stroke Clinic)    Specialties:  Neurology, Radiology   Contact information:   [address] [place] [address] [phone]       Follow up with MASOUD,JAVED, MD. Schedule an appointment as soon as possible for a visit in 1 week.   Specialty:  Internal Medicine   Contact information:   [place] [address] [phone]       Follow up with Dola Argyle, MD. Schedule an appointment as soon as possible for a visit in 1 week.   Specialty:  Cardiology   Contact information:   [address] [place] [address] [phone]         Discharge Instructions  and  Discharge Medications  Discharge Orders   Future Appointments Provider Department Dept Phone   10/27/2013 11:30 AM [name], NP [facility] [phone]   12/20/2013 10:50 AM [facility] [place] [facility] [phone]   01/19/2014 3:00 PM Troy Sine, MD [facility] [phone]   Future Orders Complete By Expires   Diet - low sodium heart healthy  As directed    Discharge instructions  As directed    Scheduling Instructions:   Follow with Primary MD MASOUD,JAVED, MD in 3 days   Get CBC, CMP, INR checked 3 days by Primary MD and again as instructed by your Primary MD.     Activity: As tolerated with Full fall precautions use walker/cane & assistance as needed   Disposition Home     Diet: Heart Healthy    For Heart failure patients - Check your Weight same time everyday, if you gain over 2 pounds, or you develop in leg swelling,  experience more shortness of breath or chest pain, call your Primary MD immediately. Follow Cardiac Low Salt Diet and 1.8 lit/day fluid restriction.   On your next visit with her primary care physician please Get Medicines reviewed and adjusted.  Please request your Prim.MD to go over all Hospital Tests and Procedure/Radiological results at the follow up, please get all Hospital records sent to your Prim MD by signing hospital release before you go home.   If you experience worsening of your admission symptoms, develop shortness of breath, life threatening emergency, suicidal or homicidal thoughts you must seek medical attention immediately by calling 911 or calling your MD immediately  if symptoms less severe.  You Must read complete instructions/literature along with all the possible adverse reactions/side effects for all the Medicines you take and that have been prescribed to you. Take any new Medicines after you have completely understood and accpet all the possible adverse reactions/side effects.   Do not drive and provide baby sitting services if your were admitted for syncope or siezures until you have seen by Primary MD or a Neurologist and advised to do so again.  Do not drive when taking Pain medications.    Do not take more than prescribed Pain, Sleep and Anxiety Medications  Special Instructions: If you have smoked or chewed Tobacco  in the last 2 yrs please stop smoking, stop any regular Alcohol  and or any Recreational drug use.  Wear Seat belts while driving.   Please note  You were cared for by a hospitalist during your hospital stay. If you have any questions about your discharge medications or the care you received while you were in the hospital after you are discharged, you can call the unit and asked to speak with the hospitalist on call if the hospitalist that took care of you is not available. Once you are discharged, your primary care physician will handle any further  medical issues. Please note that NO REFILLS for any discharge medications will be authorized once you are discharged, as it is imperative that you return to your primary care physician (or establish a relationship with a primary care physician if you do not have one) for your aftercare needs so that they can reassess your need for medications and monitor your lab values.   Increase activity slowly  As directed        Medication List         aspirin 81 MG tablet  Take 81 mg by mouth daily.  furosemide 20 MG tablet  Commonly known as:  LASIX  Take 20 mg by mouth daily.     HYDROcodone-homatropine 5-1.5 MG/5ML syrup  Commonly known as:  HYCODAN  Take 5 mLs by mouth every 6 (six) hours as needed for cough.     levofloxacin 750 MG tablet  Commonly known as:  LEVAQUIN  Take 1 tablet (750 mg total) by mouth every other day.     nebivolol 5 MG tablet  Commonly known as:  BYSTOLIC  Take 5 mg by mouth daily.     predniSONE 10 MG tablet  Commonly known as:  DELTASONE  Take 4 tabs  daily with food x 4 days, then 3 tabs daily x 4 days, then 2 tabs daily x 4 days, then 1 tab daily x4 days then stop. #40     ramipril 5 MG capsule  Commonly known as:  ALTACE  Take 1 capsule (5 mg total) by mouth daily.     simvastatin 20 MG tablet  Commonly known as:  ZOCOR  Take 20 mg by mouth every morning.     warfarin 5 MG tablet  Commonly known as:  COUMADIN  Take 5 mg by mouth every morning. 2.5mg  Monday, Wednesday, and Friday.  5mg  all other days          Diet and Activity recommendation: See Discharge Instructions above   Consults obtained - Neuro, Cards   Major procedures and Radiology Reports - PLEASE review detailed and final reports for all details, in brief -   Carotid US - Preliminary report: Bilateral: 1-39% ICA stenosis. Vertebral artery flow is antegrade.    Echo few days ago  - Left ventricle: The cavity size was normal. Systolic function was normal. The estimated  ejection fraction was in the range of 55% to 60%. Possible hypokinesis of the apical myocardium. - Aortic valve: Diffuse thickening and calcification. Valve mobility was restricted. There was moderate stenosis. Valve area: 0.68cm^2(VTI). Valve area: 0.79cm^2 (Vmax). - Mitral valve: Severely calcified annulus. Moderately thickened, moderately calcified leaflets . Mild regurgitation. Valve area by continuity equation (using LVOT flow): 1.23cm^2. - Left atrium: The atrium was moderately dilated. - Right ventricle: The cavity size was moderately dilated. Wall thickness was normal.    Dg Chest 2 View  10/19/2013   CLINICAL DATA:  Stroke.  EXAM: CHEST  2 VIEW  COMPARISON:  Chest x-ray from the same day at 4:01 p.m.  FINDINGS: Dual-chamber left approach pacer is unremarkable and unchanged.  Mild cardiomegaly. No acute mediastinal contour abnormality. Mitral annular calcification. No edema, consolidation, effusion, or pneumothorax. Generous lung volumes, which may reflect COPD.  IMPRESSION: No active cardiopulmonary disease.   Electronically Signed   By: Jorje Guild M.D.   On: 10/19/2013 23:12   Dg Chest 2 View  10/15/2013   CLINICAL DATA:  Cough, shortness of breath  EXAM: CHEST  2 VIEW  COMPARISON:  DG CHEST 2 VIEW dated 09/04/2012; DG CHEST 2 VIEW dated 10/17/2006; DG CHEST 2 VIEW dated 10/07/2006  FINDINGS: Grossly unchanged enlarged cardiac silhouette and mediastinal contours with prominence of the central pulmonary arteries. Stable positioning of support apparatus. The lungs remain hyperexpanded with flattening of the bilateral diaphragms and mild diffuse slightly nodular thickening of the pulmonary interstitium. There is minimal pleural parenchymal thickening about the right minor and the bilateral major fissures. No focal airspace opacities. No pleural effusion or pneumothorax. No evidence of edema. Unchanged bones including mild (< 25%) compression deformity of a lower thoracic vertebral body.  Post  cholecystectomy.  IMPRESSION: 1. Hyperexpanded lungs and bronchitic change without acute cardiopulmonary disease. 2. Cardiomegaly and prominence of the central pulmonary arteries, nonspecific though could be seen in the setting of pulmonary arterial hypertension. Further evaluation cardiac echo could be performed as clinically indicated.   Electronically Signed   By: Sandi Mariscal M.D.   On: 10/15/2013 16:32   Ct Head Wo Contrast  10/19/2013   CLINICAL DATA:  Unable to answer questions.  Weakness.  Code stroke.  EXAM: CT HEAD WITHOUT CONTRAST  TECHNIQUE: Contiguous axial images were obtained from the base of the skull through the vertex without contrast.  COMPARISON:  None  FINDINGS: Generalized cerebral and cerebellar atrophy. Chronic microvascular ischemic change. No evidence for acute infarction, hemorrhage, mass lesion, hydrocephalus, or extra-axial fluid. Calvarium intact. Carotid, vertebral, and basilar calcification. Chronic right maxillary sinus disease. No mastoid fluid. Bilateral external ear canal cerumen. Right cataract extraction.  IMPRESSION: Generalized atrophy.  No acute intracranial findings.  Critical Value/emergent results were called by telephone at the time of interpretation on 10/19/2013 at 3:09 PM to Dr. Wallie Char who verbally acknowledged these results.   Electronically Signed   By: Rolla Flatten M.D.   On: 10/19/2013 15:10   Dg Chest Portable 1 View  10/19/2013   CLINICAL DATA:  Code stroke  EXAM: PORTABLE CHEST - 1 VIEW  COMPARISON:  10/16/2013  FINDINGS: Cardiomegaly again noted. Mild interstitial prominence bilaterally without convincing pulmonary edema. Dual lead cardiac pacemaker is unchanged in position. No segmental infiltrate.  IMPRESSION: Cardiomegaly. Stable dual lead cardiac pacemaker. Mild interstitial prominence without pulmonary edema. No focal infiltrate.   Electronically Signed   By: Lahoma Crocker M.D.   On: 10/19/2013 16:30   Dg Chest Port 1 View  10/16/2013   CLINICAL  DATA:  Shortness of breath  EXAM: PORTABLE CHEST - 1 VIEW  COMPARISON:  DG CHEST 2 VIEW dated 10/15/2013  FINDINGS: Cardiac enlargement is more prominent than on prior study. Developing interstitial changes in the lungs suggesting possible interstitial edema. Pulmonary vascularity is normal. No blunting of costophrenic angles. No focal consolidation. Cardiac pacemaker.  IMPRESSION: Enlarging heart size with developing interstitial changes in the lungs, possibly due to edema.   Electronically Signed   By: Lucienne Capers M.D.   On: 10/16/2013 04:09    Micro Results      Recent Results (from the past 240 hour(s))  CULTURE, BLOOD (ROUTINE X 2)     Status: None   Collection Time    10/16/13  3:40 AM      Result Value Ref Range Status   Specimen Description BLOOD RIGHT ARM   Final   Special Requests BOTTLES DRAWN AEROBIC AND ANAEROBIC 10CC EACH   Final   Culture  Setup Time     Final   Value: 10/16/2013 10:41     Performed at [facility]   Culture     Final   Value:        BLOOD CULTURE RECEIVED NO GROWTH TO DATE CULTURE WILL BE HELD FOR 5 DAYS BEFORE ISSUING A FINAL NEGATIVE REPORT     Performed at [facility]   Report Status PENDING   Incomplete  CULTURE, BLOOD (ROUTINE X 2)     Status: None   Collection Time    10/16/13  3:45 AM      Result Value Ref Range Status   Specimen Description BLOOD LEFT ARM   Final   Special Requests BOTTLES DRAWN AEROBIC ONLY  10CC   Final   Culture  Setup Time     Final   Value: 10/16/2013 10:41     Performed at [facility]   Culture     Final   Value:        BLOOD CULTURE RECEIVED NO GROWTH TO DATE CULTURE WILL BE HELD FOR 5 DAYS BEFORE ISSUING A FINAL NEGATIVE REPORT     Performed at [facility]   Report Status PENDING   Incomplete  URINE CULTURE     Status: None   Collection Time    10/16/13  3:52 AM      Result Value Ref Range Status   Specimen Description URINE, CLEAN CATCH   Final   Special Requests NONE   Final    Culture  Setup Time     Final   Value: 10/16/2013 10:41     Performed at [facility]     Final   Value: >=100,000 COLONIES/ML     Performed at [facility]   Culture     Final   Value: Multiple bacterial morphotypes present, none predominant. Suggest appropriate recollection if clinically indicated.     Performed at [facility]   Report Status 10/18/2013 FINAL   Final  MRSA PCR SCREENING     Status: None   Collection Time    10/16/13  8:17 AM      Result Value Ref Range Status   MRSA by PCR NEGATIVE  NEGATIVE Final   Comment:            The GeneXpert MRSA Assay (FDA     approved for NASAL specimens     only), is one component of a     comprehensive MRSA colonization     surveillance program. It is not     intended to diagnose MRSA     infection nor to guide or     monitor treatment for     MRSA infections.     History of present illness and  Hospital Course:     Kindly see H&P for history of present illness and admission details, please review complete Labs, Consult reports and Test reports for all details in brief BRNADON KIRSCHENMANN, is a 80 y.o. male, patient with history of  atrial fibrillation status post pacemaker in the past, CAD, hypertension, hyperlipidemia recently discharged-5/4 following admission for acute respiratory failure with suspected asthmatic bronchitis and presents today with above complaints. The history obtained from wife at bedside. She states that she had gone into the kitchen and when she came back she found him on sofa slumped over and unresponsive. No reports of incontinence or seizure-like activity. Also denies slurred speech dysphagia. Per EMS/EDP he was noted to have right-sided weakness following that although wife states that it was the left sided that was weaker. Patient's symptoms are resolved while in the ED. He denies any weakness at this time. Patient also denies chest pain, shortness of breath and no  dizziness. He was seen in the ED per neuro and admission for possible TIA requested. CT head in the ED showed no acute findings. His pacemaker was interrogated in the ED and within normal limits.   Syncope -Slurred Speech likely TIA - head CT -ve, cannot do MRI as has a pacemaker, stable for DC per Neuro, likely TIA, continue Coumadin, will have Cards see as had NSTEMI recently,  OK by Cards will DC home.    Asthmatic  bronchitis , recently DC'd by PCCM,  continue Levaquin and steroid taper unchanged.     Afib-HTN - home meds unchanged, monitor INR closely.    ARF - creatinine appears to be 1.4, we'll request PCP to kindly monitor creatinine closely, I had hydrated the patient gently prior to discharge, and held his ACE for a day, however he refused to stay beyond one day for a repeat BMP, placed him back on his home medications, kindly repeat BMP in 3-4 days, if creatinine still elevated consider discontinuing ACE inhibitor as EF is preserved. Also can consider discontinuing Lasix if appropriate.      Today   Subjective:   Norman Lee today has no headache,no chest abdominal pain,no new weakness tingling or numbness, feels much better wants to go home today.    Objective:   Blood pressure 114/76, pulse 76, temperature 97.5 F (36.4 C), temperature source Oral, resp. rate 18, height 6' 2.02" (1.88 m), weight 77.5 kg (170 lb 13.7 oz), SpO2 95.00%.   Intake/Output Summary (Last 24 hours) at 10/20/13 1521 Last data filed at 10/20/13 1428  Gross per 24 hour  Intake   1100 ml  Output    550 ml  Net    550 ml    Exam Awake Alert, Oriented *3, No new F.N deficits, Normal affect [place].AT,PERRAL Supple Neck,No JVD, No cervical lymphadenopathy appriciated.  Symmetrical Chest wall movement, Good air movement bilaterally, CTAB RRR,No Gallops,Rubs or new Murmurs, No Parasternal Heave +ve B.Sounds, Abd Soft, Non tender, No organomegaly appriciated, No rebound -guarding or  rigidity. No Cyanosis, Clubbing or edema, No new Rash or bruise.    Data Review   CBC w Diff: Lab Results  Component Value Date   WBC 9.6 10/19/2013   HGB 13.9 10/19/2013   HCT 41.0 10/19/2013   PLT 192 10/19/2013   LYMPHOPCT 18 10/19/2013   MONOPCT 12 10/19/2013   EOSPCT 0 10/19/2013   BASOPCT 0 10/19/2013    CMP: Lab Results  Component Value Date   NA 139 10/19/2013   K 4.1 10/19/2013   CL 98 10/19/2013   CO2 27 10/19/2013   BUN 41* 10/19/2013   CREATININE 1.50* 10/19/2013   CREATININE 1.23 10/28/2012   PROT 6.4 10/19/2013   ALBUMIN 3.4* 10/19/2013   BILITOT 1.1 10/19/2013   ALKPHOS 42 10/19/2013   AST 28 10/19/2013   ALT 23 10/19/2013  . Lab Results  Component Value Date   INR 2.09* 10/20/2013   INR 1.96* 10/19/2013   INR 1.84* 10/18/2013     Total Time in preparing paper work, data evaluation and todays exam - 35 minutes  Thurnell Lose M.D on 10/20/2013 at 3:21 PM  [facility] Office  [phone]

## 2013-10-20 NOTE — Progress Notes (Signed)
Patient Demographics  Norman Lee, is a 80 y.o. male, DOB - [birth year], [id number]  Admit date - 10/19/2013   Admitting Physician Sheila Oats, MD  Outpatient Primary MD for the patient is Norman,JAVED, MD  LOS - 1   Chief Complaint  Patient presents with  . Code Stroke        Assessment & Plan    1. Unresponsive episode. Questionable weakness on right side - symptoms completely resolved, head CT unremarkable, has pacemaker she cannot do MRI, had recent admission for bronchitis, he also had a mild NSTEMI last admission and underwent the echogram with preserved EF of around 50% but it did show hypokinesis, also has underlying A. fib. Upon admission pacemaker interrogation per history and physical was unremarkable, head CT stable, unclear reason for his syncope and unresponsiveness. Neuro to follow, will have PT-OT along with speech see the patient also. Also have cardiology evaluate the patient to make sure the unresponsive episode was not cardiac in etiology. Will cycle troponins, continue aspirin Coumadin for now.     2. Atrial fibrillation. Goal will be rate controlled, continue the patient on Coumadin to be dosed by pharmacy are not is in range, home dose beta blocker will be continued.     3. Recent acute bronchitis. Continue Levaquin and steroid taper per recent discharge summary by critical care team few days ago.     4. Acute renal failure. Baseline creatinine appears to be 1.2, will hold ACE inhibitor and diuretic, gentle hydration, repeat BMP in the morning.     5. Hypertension. Hold ACE due to #4 above, continue home dose beta blocker and monitor.        Code Status: Full  Family Communication:    Disposition Plan: Home   Procedures CT Head, Carotid US  Pending   Recent TTE 10-18-13  - Left ventricle: The cavity size was normal. Systolic function was normal. The estimated ejection fraction was in the range of 55% to 60%. Possible hypokinesis of the apical myocardium. - Aortic valve: Diffuse thickening and calcification. Valve mobility was restricted. There was moderate stenosis. Valve area: 0.68cm^2(VTI). Valve area: 0.79cm^2 (Vmax). - Mitral valve: Severely calcified annulus. Moderately thickened, moderately calcified leaflets . Mild regurgitation. Valve area by continuity equation (using LVOT flow): 1.23cm^2. - Left atrium: The atrium was moderately dilated. - Right ventricle: The cavity size was moderately dilated. Wall thickness was normal.     Consults  Neuro, cards   Medications  Scheduled Meds: . aspirin  81 mg Oral Daily  . [START ON 10/21/2013] levofloxacin  750 mg Oral Q48H  . nebivolol  5 mg Oral Daily  . predniSONE  40 mg Oral Q breakfast  . simvastatin  20 mg Oral BH-q7a  . warfarin  5 mg Oral NOW  . Warfarin - Pharmacist Dosing Inpatient   Does not apply q1800   Continuous Infusions: . sodium chloride     PRN Meds:.HYDROcodone-homatropine, ondansetron (ZOFRAN) IV, senna-docusate  DVT Prophylaxis  Coumadin  Lab Results  Component Value Date   INR 2.09* 10/20/2013   INR 1.96* 10/19/2013   INR 1.84* 10/18/2013     Lab Results  Component Value Date   PLT 192 10/19/2013    Antibiotics  Anti-infectives   Start     Dose/Rate Route Frequency Ordered Stop   10/21/13 0800  levofloxacin (LEVAQUIN) tablet 750 mg     750 mg Oral Every 48 hours 10/19/13 1912            Subjective:   [name] today has, No headache, No chest pain, No abdominal pain - No Nausea, No new weakness tingling or numbness, No Cough - SOB.    Objective:   Filed Vitals:   10/20/13 0150 10/20/13 0500 10/20/13 0509 10/20/13 0651  BP: 125/84  114/86 129/80  Pulse: 70  71 70  Temp: 97.9 F (36.6 C)  97.9 F (36.6 C) 97.9 F  (36.6 C)  TempSrc: Oral  Oral Oral  Resp: 18  18 18   Height:      Weight:  77.5 kg (170 lb 13.7 oz)    SpO2: 99%  96% 95%    Wt Readings from Last 3 Encounters:  10/20/13 77.5 kg (170 lb 13.7 oz)  10/18/13 77.5 kg (170 lb 13.7 oz)  10/15/13 78.608 kg (173 lb 4.8 oz)     Intake/Output Summary (Last 24 hours) at 10/20/13 1055 Last data filed at 10/19/13 1604  Gross per 24 hour  Intake    500 ml  Output      0 ml  Net    500 ml     Physical Exam  Awake Alert, Oriented X 3, No new F.N deficits, Normal affect [place].AT,PERRAL Supple Neck,No JVD, No cervical lymphadenopathy appriciated.  Symmetrical Chest wall movement, Good air movement bilaterally, CTAB RRR,No Gallops,Rubs or new Murmurs, No Parasternal Heave +ve B.Sounds, Abd Soft, Non tender, No organomegaly appriciated, No rebound - guarding or rigidity. No Cyanosis, Clubbing or edema, No new Rash or bruise      Data Review   Micro Results Recent Results (from the past 240 hour(s))  CULTURE, BLOOD (ROUTINE X 2)     Status: None   Collection Time    10/16/13  3:40 AM      Result Value Ref Range Status   Specimen Description BLOOD RIGHT ARM   Final   Special Requests BOTTLES DRAWN AEROBIC AND ANAEROBIC 10CC EACH   Final   Culture  Setup Time     Final   Value: 10/16/2013 10:41     Performed at [facility]   Culture     Final   Value:        BLOOD CULTURE RECEIVED NO GROWTH TO DATE CULTURE WILL BE HELD FOR 5 DAYS BEFORE ISSUING A FINAL NEGATIVE REPORT     Performed at [facility]   Report Status PENDING   Incomplete  CULTURE, BLOOD (ROUTINE X 2)     Status: None   Collection Time    10/16/13  3:45 AM      Result Value Ref Range Status   Specimen Description BLOOD LEFT ARM   Final   Special Requests BOTTLES DRAWN AEROBIC ONLY 10CC   Final   Culture  Setup Time     Final   Value: 10/16/2013 10:41     Performed at [facility]   Culture     Final   Value:        BLOOD CULTURE RECEIVED NO  GROWTH TO DATE CULTURE WILL BE HELD FOR 5 DAYS BEFORE ISSUING A FINAL NEGATIVE REPORT     Performed at [facility]   Report Status PENDING   Incomplete  URINE CULTURE  Status: None   Collection Time    10/16/13  3:52 AM      Result Value Ref Range Status   Specimen Description URINE, CLEAN CATCH   Final   Special Requests NONE   Final   Culture  Setup Time     Final   Value: 10/16/2013 10:41     Performed at [place]     Final   Value: >=100,000 COLONIES/ML     Performed at [facility]   Culture     Final   Value: Multiple bacterial morphotypes present, none predominant. Suggest appropriate recollection if clinically indicated.     Performed at [facility]   Report Status 10/18/2013 FINAL   Final  MRSA PCR SCREENING     Status: None   Collection Time    10/16/13  8:17 AM      Result Value Ref Range Status   MRSA by PCR NEGATIVE  NEGATIVE Final   Comment:            The GeneXpert MRSA Assay (FDA     approved for NASAL specimens     only), is one component of a     comprehensive MRSA colonization     surveillance program. It is not     intended to diagnose MRSA     infection nor to guide or     monitor treatment for     MRSA infections.    Radiology Reports Dg Chest 2 View  10/19/2013   CLINICAL DATA:  Stroke.  EXAM: CHEST  2 VIEW  COMPARISON:  Chest x-ray from the same day at 4:01 p.m.  FINDINGS: Dual-chamber left approach pacer is unremarkable and unchanged.  Mild cardiomegaly. No acute mediastinal contour abnormality. Mitral annular calcification. No edema, consolidation, effusion, or pneumothorax. Generous lung volumes, which may reflect COPD.  IMPRESSION: No active cardiopulmonary disease.   Electronically Signed   By: Jorje Guild M.D.   On: 10/19/2013 23:12      Ct Head Wo Contrast  10/19/2013   CLINICAL DATA:  Unable to answer questions.  Weakness.  Code stroke.  EXAM: CT HEAD WITHOUT CONTRAST  TECHNIQUE:  Contiguous axial images were obtained from the base of the skull through the vertex without contrast.  COMPARISON:  None  FINDINGS: Generalized cerebral and cerebellar atrophy. Chronic microvascular ischemic change. No evidence for acute infarction, hemorrhage, mass lesion, hydrocephalus, or extra-axial fluid. Calvarium intact. Carotid, vertebral, and basilar calcification. Chronic right maxillary sinus disease. No mastoid fluid. Bilateral external ear canal cerumen. Right cataract extraction.  IMPRESSION: Generalized atrophy.  No acute intracranial findings.  Critical Value/emergent results were called by telephone at the time of interpretation on 10/19/2013 at 3:09 PM to Dr. Wallie Char who verbally acknowledged these results.   Electronically Signed   By: Rolla Flatten M.D.   On: 10/19/2013 15:10   Dg Chest Portable 1 View  10/19/2013   CLINICAL DATA:  Code stroke  EXAM: PORTABLE CHEST - 1 VIEW  COMPARISON:  10/16/2013  FINDINGS: Cardiomegaly again noted. Mild interstitial prominence bilaterally without convincing pulmonary edema. Dual lead cardiac pacemaker is unchanged in position. No segmental infiltrate.  IMPRESSION: Cardiomegaly. Stable dual lead cardiac pacemaker. Mild interstitial prominence without pulmonary edema. No focal infiltrate.   Electronically Signed   By: Lahoma Crocker M.D.   On: 10/19/2013 16:30       CBC  Recent Labs Lab 10/16/13 0345 10/16/13 0832 10/17/13 0455 10/19/13 1458 10/19/13  1502  WBC 14.5* 10.6* 9.8 9.6  --   HGB 15.8 13.3 13.2 14.2 13.9  HCT 44.2 38.6* 38.6* 40.0 41.0  PLT 174 153 142* 192  --   MCV 93.4 93.7 93.9 91.7  --   MCH 33.4 32.3 32.1 32.6  --   MCHC 35.7 34.5 34.2 35.5  --   RDW 14.5 14.8 14.5 14.3  --   LYMPHSABS  --   --   --  1.7  --   MONOABS  --   --   --  1.2*  --   EOSABS  --   --   --  0.0  --   BASOSABS  --   --   --  0.0  --     Chemistries   Recent Labs Lab 10/16/13 0345 10/16/13 0832 10/17/13 0455 10/19/13 1458 10/19/13 1502   NA 136*  --  139 138 139  K 4.2  --  4.8 4.4 4.1  CL 94*  --  98 100 98  CO2 26  --  29 27  --   GLUCOSE 144*  --  145* 127* 123*  BUN 20  --  32* 46* 41*  CREATININE 1.12 1.52* 1.43* 1.36* 1.50*  CALCIUM 9.4  --  9.8 9.4  --   AST 20  --   --  28  --   ALT 12  --   --  23  --   ALKPHOS 62  --   --  42  --   BILITOT 1.8*  --   --  1.1  --    ------------------------------------------------------------------------------------------------------------------ estimated creatinine clearance is 41.6 ml/min (by C-G formula based on Cr of 1.5). ------------------------------------------------------------------------------------------------------------------ No results found for this basename: HGBA1C,  in the last 72 hours ------------------------------------------------------------------------------------------------------------------  Recent Labs  10/20/13 0517  CHOL 106  HDL 28*  LDLCALC 60  TRIG 89  CHOLHDL 3.8   ------------------------------------------------------------------------------------------------------------------ No results found for this basename: TSH, T4TOTAL, FREET3, T3FREE, THYROIDAB,  in the last 72 hours ------------------------------------------------------------------------------------------------------------------ No results found for this basename: VITAMINB12, FOLATE, FERRITIN, TIBC, IRON, RETICCTPCT,  in the last 72 hours  Coagulation profile  Recent Labs Lab 10/16/13 2047 10/17/13 0455 10/18/13 0535 10/19/13 1458 10/20/13 0517  INR 1.76* 1.69* 1.84* 1.96* 2.09*    No results found for this basename: DDIMER,  in the last 72 hours  Cardiac Enzymes  Recent Labs Lab 10/16/13 1305 10/16/13 1450 10/16/13 1810 10/16/13 2047 10/17/13 0455  CKMB  --  5.3*  --   --   --   TROPONINI 0.65* 0.68* 0.44* 0.39* <0.30   ------------------------------------------------------------------------------------------------------------------ No components found  with this basename: POCBNP,      Time Spent in minutes  35   Thurnell Lose M.D on 10/20/2013 at 10:55 AM  Between 7am to 7pm - Pager - [phone]  After 7pm go to [email or web address] - password [email or web address]  And look for the night coverage person covering for me after hours  [facility] Office  [phone]

## 2013-10-20 NOTE — Progress Notes (Signed)
VASCULAR LAB PRELIMINARY  PRELIMINARY  PRELIMINARY  PRELIMINARY  Carotid duplex  completed.    Preliminary report:  Bilateral:  1-39% ICA stenosis.  Vertebral artery flow is antegrade.      Nani Ravens, RVT 10/20/2013, 2:05 PM

## 2013-10-20 NOTE — Telephone Encounter (Signed)
Message forwarded to Dr. Arnette Norris, CMA Jul[facility] Rainier).

## 2013-10-20 NOTE — Telephone Encounter (Signed)
She wanted you to know that he was discharged Monday.Hec collasped yesterday and after that he was taken back to the hiopsital and was adnitted. They still can not find out what is wrong with him.

## 2013-10-20 NOTE — Progress Notes (Signed)
Tried to complete admission database this am, patient was adamant about wife being present to complete admission. Notified Camera operator of patient's statement. Will continue to monitor.

## 2013-10-20 NOTE — Progress Notes (Signed)
SLP Cancellation Note  Patient Details Name: JAXTYN LINVILLE MRN: [id number] DOB: 10-14-1928   Cancelled treatment:       Reason Eval/Treat Not Completed: SLP screened - pt with mild baseline memory deficits per his report; mild word retrieval difficulty improved since this am.  No SLP needs identified, will sign off.   Frederich Balding Deegan Valentino 10/20/2013, 3:41 PM

## 2013-10-20 NOTE — Evaluation (Addendum)
Physical Therapy Evaluation Patient Details Name: Norman Lee MRN: [id number] DOB: October 05, 1928 Today's Date: 10/20/2013   History of Present Illness   Norman Lee is an 80 y.o. male imagery of atrial fibrillation, pacemaker placement, coronary artery disease, hypertension and hyperlipidemia, who suffered an episode of loss of consciousness on 10/19/13. Pt undergoing stroke workup.    Clinical Impression  Pt adm due to the above. Presents with decreased independence with gt and mobility and balance deficits. Pt to benefit from skilled acute PT to address deficits indicated below. Recommend pt ambulate with RW upon acute D/C to increase stability and reduce risk of falls.     Follow Up Recommendations Outpatient PT;Supervision for mobility/OOB (neurorehab for balance )    Equipment Recommendations  None recommended by PT (pt reports he has a RW )    Recommendations for Other Services       Precautions / Restrictions Precautions Precautions: Fall Restrictions Weight Bearing Restrictions: No      Mobility  Bed Mobility Overal bed mobility: Modified Independent             General bed mobility comments: incr time; no physical (A) required   Transfers Overall transfer level: Needs assistance Equipment used: Rolling walker (2 wheeled) Transfers: Sit to/from Stand Sit to Stand: Min guard         General transfer comment: min guard to steady with transfers; initially standing with sway ; cues for hand placement and safety with RW   Ambulation/Gait Ambulation/Gait assistance: Min guard Ambulation Distance (Feet): 200 Feet Assistive device: Rolling walker (2 wheeled) Gait Pattern/deviations: Step-through pattern;Decreased stride length;Trunk flexed Gait velocity: decreased  Gait velocity interpretation: Below normal speed for age/gender General Gait Details: pt unsteady with gt initially when attempting to ambulate without AD; more stable with RW; cues for  safety and management of RW  Stairs            Wheelchair Mobility    Modified Rankin (Stroke Patients Only)       Balance Overall balance assessment: Needs assistance Sitting-balance support: Feet supported;No upper extremity supported Sitting balance-Leahy Scale: Normal     Standing balance support: During functional activity;Bilateral upper extremity supported Standing balance-Leahy Scale: Poor Standing balance comment: initially with sway in standing without UE support                              Pertinent Vitals/Pain No c/o pain     Home Living Family/patient expects to be discharged to:: Private residence Living Arrangements: Spouse/significant other Available Help at Discharge: Family;Available 24 hours/day Type of Home: House       Home Layout: Two level;Able to live on main level with bedroom/bathroom Home Equipment: Gilford Rile - 2 wheels;Cane - single point      Prior Function Level of Independence: Independent               Hand Dominance        Extremity/Trunk Assessment   Upper Extremity Assessment: Defer to OT evaluation           Lower Extremity Assessment: Generalized weakness      Cervical / Trunk Assessment: Kyphotic  Communication   Communication: No difficulties  Cognition Arousal/Alertness: Awake/alert Behavior During Therapy: WFL for tasks assessed/performed Overall Cognitive Status: Within Functional Limits for tasks assessed                      General Comments  General comments (skin integrity, edema, etc.): encouraged to ambulate with RW     Exercises        Assessment/Plan    PT Assessment Patient needs continued PT services  PT Diagnosis Abnormality of gait;Generalized weakness   PT Problem List Decreased strength;Decreased activity tolerance;Decreased balance;Decreased mobility;Decreased knowledge of use of DME;Decreased safety awareness  PT Treatment Interventions DME  instruction;Gait training;Functional mobility training;Therapeutic activities;Therapeutic exercise;Balance training;Neuromuscular re-education;Patient/family education   PT Goals (Current goals can be found in the Care Plan section) Acute Rehab PT Goals Patient Stated Goal: to go home  PT Goal Formulation: With patient Time For Goal Achievement: 10/27/13 Potential to Achieve Goals: Good    Frequency Min 3X/week   Barriers to discharge        Co-evaluation               End of Session Equipment Utilized During Treatment: Gait belt Activity Tolerance: Patient tolerated treatment well Patient left: in bed;with call bell/phone within reach;with bed alarm set Nurse Communication: Mobility status         Time: 1110-1134 PT Time Calculation (min): 24 min   Charges:   PT Evaluation $Initial PT Evaluation Tier I: 1 Procedure PT Treatments $Gait Training: 8-22 mins   PT G CodesKennis Carina [place], [place]  [phone] 10/20/2013, 12:48 PM

## 2013-10-20 NOTE — Discharge Instructions (Signed)
Follow with Primary MD MASOUD,JAVED, MD in 3 days   Get CBC, CMP, INR checked 3 days by Primary MD and again as instructed by your Primary MD.     Activity: As tolerated with Full fall precautions use walker/cane & assistance as needed   Disposition Home     Diet: Heart Healthy    For Heart failure patients - Check your Weight same time everyday, if you gain over 2 pounds, or you develop in leg swelling, experience more shortness of breath or chest pain, call your Primary MD immediately. Follow Cardiac Low Salt Diet and 1.8 lit/day fluid restriction.   On your next visit with her primary care physician please Get Medicines reviewed and adjusted.  Please request your Prim.MD to go over all Hospital Tests and Procedure/Radiological results at the follow up, please get all Hospital records sent to your Prim MD by signing hospital release before you go home.   If you experience worsening of your admission symptoms, develop shortness of breath, life threatening emergency, suicidal or homicidal thoughts you must seek medical attention immediately by calling 911 or calling your MD immediately  if symptoms less severe.  You Must read complete instructions/literature along with all the possible adverse reactions/side effects for all the Medicines you take and that have been prescribed to you. Take any new Medicines after you have completely understood and accpet all the possible adverse reactions/side effects.   Do not drive and provide baby sitting services if your were admitted for syncope or siezures until you have seen by Primary MD or a Neurologist and advised to do so again.  Do not drive when taking Pain medications.    Do not take more than prescribed Pain, Sleep and Anxiety Medications  Special Instructions: If you have smoked or chewed Tobacco  in the last 2 yrs please stop smoking, stop any regular Alcohol  and or any Recreational drug use.  Wear Seat belts while  driving.   Please note  You were cared for by a hospitalist during your hospital stay. If you have any questions about your discharge medications or the care you received while you were in the hospital after you are discharged, you can call the unit and asked to speak with the hospitalist on call if the hospitalist that took care of you is not available. Once you are discharged, your primary care physician will handle any further medical issues. Please note that NO REFILLS for any discharge medications will be authorized once you are discharged, as it is imperative that you return to your primary care physician (or establish a relationship with a primary care physician if you do not have one) for your aftercare needs so that they can reassess your need for medications and monitor your lab values.

## 2013-10-20 NOTE — Consult Note (Signed)
CARDIOLOGY CONSULT NOTE  Patient ID: Norman Lee MRN: [id number] DOB/AGE: 03/08/1929 80 y.o.  Admit date: 10/19/2013 Primary Physician Cletis Athens, MD Primary Cardiologist Dr. Georgina Peer Chief Complaint  Sycnope  HPI:  The patient was admitted with a syncopal episode yesterday.  He was admitted with possible TIA having described left sided weakness as well.  He has a history of CAD, atrial fib in warfarin, MR and PPM placement.  He was discharged from the hospital the day before after being treated for possible sepsis with pneumonia.  During that admission we saw him for management of acute pulmonary edema.  He did have a mild troponin elevation.  He did have renal insufficiency.    He has been seen by neurology.  CT scan of his head showed no acute intracranial abnormality.   He did have a slightly subtherapeutic INR of 1.96.  He is thought to have a left brain TIA.  The have been asked if he could switch to Eliquis.  He reports that he was doing OK when he went home.  However, he took all of his pills in the AM and then walked into his den where he had an LOC.  He did not have a prodrome.  He has not had orthostatic symptoms.  He did not have chest pain or palpitations.  There was apparently some right sided weakness.  He feels well and wants to go home now.  Of note his BP had been low during the recent hospitalization and his meds were reduced.   Orthostatic BP swere apparently not recorded on presentation.     Past Medical History  Diagnosis Date  . Prostate cancer   . CAD (coronary artery disease)     Circumflex MI 1995, Last cath 2008  . Mitral regurgitation     Severe  . HLD (hyperlipidemia)   . HTN (hypertension)   . Atrial fibrillation   . Pulmonary hypertension   . Presence of permanent cardiac pacemaker 10/16/2006,09/04/12    Medtronic adapta  . Chronic anticoagulation   . Dysrhythmia     ATRIAL FIBRILATION  . Pacemaker     Past Surgical History  Procedure Laterality  Date  . Permanent pacemaker insertion  10/16/2006    Medtronic Enrhythm  . Permanent pacemaker generator change  09/04/2012    Medtronic adapta  . Coronary angioplasty  09/16/1993    100% CX-successful  . Cardiac catheterization  05/11/2002    70-80% small first diagonal,50% RCA  . Cardiac catheterization  10/14/2006    multivessell,EF 40-50%  . Nm myoview ltd  08/30/2009    mod. perfusion defect-mid inferior,apical inferior,basal inferolateral,mid inferolateral & apical lateral regions  . US echocardiography  03/27/2012    severe MR persists w/progressive LA dilatation,RSVP has increased & AS has progressed    Allergies  Allergen Reactions  . Codeine Nausea And Vomiting   Prescriptions prior to admission  Medication Sig Dispense Refill  . aspirin 81 MG tablet Take 81 mg by mouth daily.      . furosemide (LASIX) 20 MG tablet Take 20 mg by mouth daily.       Marland Kitchen HYDROcodone-homatropine (HYCODAN) 5-1.5 MG/5ML syrup Take 5 mLs by mouth every 6 (six) hours as needed for cough.  120 mL  0  . levofloxacin (LEVAQUIN) 750 MG tablet Take 1 tablet (750 mg total) by mouth every other day.  5 tablet  0  . nebivolol (BYSTOLIC) 5 MG tablet Take 5 mg by mouth daily.      Marland Kitchen  predniSONE (DELTASONE) 10 MG tablet Take 4 tabs  daily with food x 4 days, then 3 tabs daily x 4 days, then 2 tabs daily x 4 days, then 1 tab daily x4 days then stop. #40  40 tablet  0  . ramipril (ALTACE) 5 MG capsule Take 1 capsule (5 mg total) by mouth daily.  30 capsule  1  . simvastatin (ZOCOR) 20 MG tablet Take 20 mg by mouth every morning.      . warfarin (COUMADIN) 5 MG tablet Take 5 mg by mouth every morning. 2.5mg  Monday, Wednesday, and Friday.  5mg  all other days       Family History  Problem Relation Age of Onset  . Diabetes Sister     History   Social History  . Marital Status: Married    Spouse Name: N/A    Number of Children: N/A  . Years of Education: N/A   Occupational History  . Not on file.   Social  History Main Topics  . Smoking status: Never Smoker   . Smokeless tobacco: Never Used  . Alcohol Use: No  . Drug Use: No  . Sexual Activity: Not on file   Other Topics Concern  . Not on file   Social History Narrative  . No narrative on file     ROS:    As stated in the HPI and negative for all other systems.  Physical Exam: Blood pressure 114/76, pulse 76, temperature 97.5 F (36.4 C), temperature source Oral, resp. rate 18, height 6' 2.02" (1.88 m), weight 170 lb 13.7 oz (77.5 kg), SpO2 95.00%.  GENERAL:  Well appearing HEENT:  Pupils equal round and reactive, fundi not visualized, oral mucosa unremarkable NECK:  No jugular venous distention, waveform within normal limits, carotid upstroke brisk and symmetric, no bruits, no thyromegaly LYMPHATICS:  No cervical, inguinal adenopathy LUNGS:  Clear to auscultation bilaterally BACK:  No CVA tenderness CHEST:  Unremarkable HEART:  PMI not displaced or sustained,S1 and S2 within normal limits, no S3, no S4, no clicks, no rubs, 4/6 holosystolic murmur in the left anterior precordium with no diastolic murmurs ABD:  Flat, positive bowel sounds normal in frequency in pitch, no bruits, no rebound, no guarding, no midline pulsatile mass, no hepatomegaly, no splenomegaly EXT:  2 plus pulses throughout, no edema, no cyanosis no clubbing SKIN:  No rashes no nodules NEURO:  Cranial nerves II through XII grossly intact, motor grossly intact throughout PSYCH:  Cognitively intact, oriented to person place and time    Labs: Lab Results  Component Value Date   BUN 41* 10/19/2013   Lab Results  Component Value Date   CREATININE 1.50* 10/19/2013   Lab Results  Component Value Date   NA 139 10/19/2013   K 4.1 10/19/2013   CL 98 10/19/2013   CO2 27 10/19/2013   Lab Results  Component Value Date   TROPONINI <0.30 10/20/2013   Lab Results  Component Value Date   WBC 9.6 10/19/2013   HGB 13.9 10/19/2013   HCT 41.0 10/19/2013   MCV 91.7 10/19/2013   PLT  192 10/19/2013   Lab Results  Component Value Date   CHOL 106 10/20/2013   HDL 28* 10/20/2013   LDLCALC 60 10/20/2013   TRIG 89 10/20/2013   CHOLHDL 3.8 10/20/2013   Lab Results  Component Value Date   ALT 23 10/19/2013   AST 28 10/19/2013   ALKPHOS 42 10/19/2013   BILITOT 1.1 10/19/2013     Radiology:  CXR: Cardiomegaly again noted. Mild interstitial prominence bilaterally without convincing pulmonary edema. Dual lead cardiac pacemaker is  unchanged in position. No segmental infiltrate.   JJH:[id number] pacing.  ASSESSMENT AND PLAN:   SYNCOPE:  Felt to have a TIA.  INR was subtherapeutic.    I think this could have been a hypotensive episode.  He can follow with Dr. Claiborne Billings.  No further in patient work up is suggested.  I would not suggest switching to Eliquis as this is valvular atrial fib and Eliquis is not approved for this.  Continue warfarin.   ATRIAL FIB:  As above.     CAD:  No evidence of active ischemia.   MR:  Partial flail posterior leaflet.  However, this was only mild MR on recent echo.  He will have follow up of this with Dr. Claiborne Billings.  I would suggest that that echo was incorrect judging by his exam.   Signed: Minus Breeding 10/20/2013, 3:37 PM

## 2013-10-20 NOTE — Progress Notes (Signed)
AVs ,discharge and follow up instruction  And handout given  To pt and spouse and both verbalized good understanding. Vs stable and condition at discharge  Is stable

## 2013-10-20 NOTE — ED Provider Notes (Signed)
I saw and evaluated the patient, reviewed the resident's note and I agree with the findings and plan.   EKG Interpretation   Date/Time:  Tuesday Oct 19 2013 15:07:20 EDT Ventricular Rate:  76 PR Interval:  152 QRS Duration: 166 QT Interval:  440 QTC Calculation: 495 R Axis:   -80 Text Interpretation:  VENTRICULAR PACED RHYTHM similar to prior Confirmed  by Rilynn Habel  MD, Loma Sousa ([id number]) on 10/19/2013 3:14:59 PM      Gen:  Alert HEENT: Normocephalic, atraumatic Card:  RRR, no m/r/g Pulm: CTAB Abd: Soft nontender Neuro:  Right-sided neglect, 4/5 strength in the right upper extremity upon initial evaluation  Patient presented as a code stroke. Evaluated by neurology and myself upon arrival. Patient is currently on Coumadin and is not a TPA candidate even if he is within the window. His symptoms appear to be improving. CT scan negative. Patient will be admitted for a full stroke workup.    Merryl Hacker, MD 10/20/13 2257

## 2013-10-20 NOTE — Evaluation (Signed)
Occupational Therapy Evaluation Patient Details Name: Norman Lee MRN: [id number] DOB: Jun 28, 1928 Today's Date: 10/20/2013    History of Present Illness Norman Lee is an 80 y.o. male imagery of atrial fibrillation, pacemaker placement, coronary artery disease, hypertension and hyperlipidemia, who suffered an episode of loss of consciousness on 10/19/13. Pt undergoing stroke workup.     Clinical Impression   Pt presents with below problem list. Pt independent with ADLs, PTA. Feel pt will benefit from acute OT to increase independence prior to d/c.     Follow Up Recommendations  No OT follow up;Supervision/Assistance - 24 hour    Equipment Recommendations  None recommended by OT    Recommendations for Other Services       Precautions / Restrictions Precautions Precautions: Fall Restrictions Weight Bearing Restrictions: No      Mobility Bed Mobility Overal bed mobility: Modified Independent               Transfers Overall transfer level: Needs assistance Equipment used: Rolling walker (2 wheeled) Transfers: Sit to/from Stand Sit to Stand: Supervision;Min guard         General transfer comment: Min guard for sit <> stand to/from lower surfaces (couch, regular height commode)         ADL Overall ADL's : Needs assistance/impaired     Grooming: Wash/dry hands;Supervision/safety;Standing;Set up           Upper Body Dressing : Set up;Sitting   Lower Body Dressing: Set up;Supervision/safety;Sit to/from stand   Toilet Transfer: Min guard;Ambulation;Regular Toilet;Comfort height toilet;Grab bars;RW   Toileting- Clothing Manipulation and Hygiene: Supervision/safety;Sit to/from stand   Tub/ Shower Transfer: Minimal assistance;Ambulation;Rolling walker   Functional mobility during ADLs: Min guard;Minimal assistance;Rolling walker (Min A for shower transfer, otherwise, Min guard) General ADL Comments: Educated on use of bag on walker, safe  shoewear, wife to pick up rugs in house (non skid in bathroom). Recommended sitting for bathing and dressing. Educated on signs/symptoms of stroke and importance of getting help right away. Practiced shower transfer and recommended wife be with him for this. Wrote down some information for pt.     Vision                     Perception     Praxis      Pertinent Vitals/Pain No apparent distress.     Hand Dominance     Extremity/Trunk Assessment Upper Extremity Assessment Upper Extremity Assessment: Overall WFL for tasks assessed   Lower Extremity Assessment Lower Extremity Assessment: Defer to PT evaluation     Communication Communication Communication: HOH   Cognition Arousal/Alertness: Awake/alert Behavior During Therapy: WFL for tasks assessed/performed Overall Cognitive Status: Within Functional Limits for tasks assessed       Memory: Decreased short-term memory             General Comments       Exercises       Shoulder Instructions      Home Living Family/patient expects to be discharged to:: Private residence Living Arrangements: Spouse/significant other Available Help at Discharge: Family;Available 24 hours/day Type of Home: House       Home Layout: Two level;Able to live on main level with bedroom/bathroom     Bathroom Shower/Tub: Occupational psychologist: Standard (sink close by)     Home Equipment: Gilford Rile - 2 wheels;Cane - single point          Prior Functioning/Environment Level of Independence: Independent  OT Diagnosis: Generalized weakness   OT Problem List: Impaired balance (sitting and/or standing);Decreased knowledge of precautions;Decreased knowledge of use of DME or AE;Decreased cognition;Decreased strength   OT Treatment/Interventions: Self-care/ADL training;DME and/or AE instruction;Therapeutic activities;Cognitive remediation/compensation;Patient/family education;Balance  training;Therapeutic exercise    OT Goals(Current goals can be found in the care plan section) Acute Rehab OT Goals Patient Stated Goal: go home OT Goal Formulation: With patient Time For Goal Achievement: 10/27/13 Potential to Achieve Goals: Good ADL Goals Pt Will Perform Lower Body Dressing: with modified independence;sit to/from stand (including gathering supplies) Pt Will Transfer to Toilet: with modified independence;ambulating;regular height toilet;grab bars Pt Will Perform Tub/Shower Transfer: Shower transfer;with supervision;ambulating;shower seat;rolling walker  OT Frequency: Min 2X/week   Barriers to D/C:            Co-evaluation              End of Session Equipment Utilized During Treatment: Gait belt;Rolling walker  Activity Tolerance: Patient tolerated treatment well Patient left: in chair;with chair alarm set;Other (comment) (with SLP)   Time: 9417-4081 OT Time Calculation (min): 27 min Charges:  OT General Charges $OT Visit: 1 Procedure OT Evaluation $Initial OT Evaluation Tier I: 1 Procedure OT Treatments $Self Care/Home Management : 8-22 mins G-Codes: OT G-codes **NOT FOR INPATIENT CLASS** Functional Assessment Tool Used: clinical judgment Functional Limitation: Self care Self Care Current Status (K4818): At least 1 percent but less than 20 percent impaired, limited or restricted Self Care Goal Status (H6314): At least 1 percent but less than 20 percent impaired, limited or restricted  Norman Lee 10/20/2013, 3:50 PM

## 2013-10-20 NOTE — Progress Notes (Signed)
ANTICOAGULATION CONSULT NOTE - Initial Consult  Pharmacy Consult:  Coumadin Indication: atrial fibrillation  Allergies  Allergen Reactions  . Codeine Nausea And Vomiting    Patient Measurements: Height: 6' 2.02" (188 cm) Weight: 170 lb 13.7 oz (77.5 kg) IBW/kg (Calculated) : 82.24  Vital Signs: Temp: 97.9 F (36.6 C) (05/06 0651) Temp src: Oral (05/06 0651) BP: 129/80 mmHg (05/06 0651) Pulse Rate: 70 (05/06 0651)  Labs:  Recent Labs  10/18/13 0535 10/19/13 1458 10/19/13 1502 10/20/13 0517  HGB  --  14.2 13.9  --   HCT  --  40.0 41.0  --   PLT  --  192  --   --   APTT  --  32  --   --   LABPROT 20.7* 21.7*  --  22.8*  INR 1.84* 1.96*  --  2.09*  CREATININE  --  1.36* 1.50*  --     Estimated Creatinine Clearance: 41.6 ml/min (by C-G formula based on Cr of 1.5).   Medical History: Past Medical History  Diagnosis Date  . Prostate cancer   . CAD (coronary artery disease)     Circumflex MI 1995, Last cath 2008  . Mitral regurgitation     Severe  . HLD (hyperlipidemia)   . HTN (hypertension)   . Atrial fibrillation   . Pulmonary hypertension   . Presence of permanent cardiac pacemaker 10/16/2006,09/04/12    Medtronic adapta  . Chronic anticoagulation       Assessment: 80 yo male presented as a Code Stroke to continue on Coumadin for history of AFib.  No bleeding on head CT.  INR therapeutic at low end this morning.  Patient was started on levaquin which can effect INR, however patient refused warfarin last night, expect to see a drop in INR.  PTA warfarin dose: 2.5 mg on Mon/Wed/Fri and 5 mg po on all other days  Goal of Therapy:  INR 2-3 Monitor platelets by anticoagulation protocol: Yes    Plan:  Warfarin 7.5 mg po x1 Daily INR   Hughes Better, PharmD, BCPS Clinical Pharmacist Pager: [phone] 10/20/2013 10:59 AM

## 2013-10-22 LAB — CULTURE, BLOOD (ROUTINE X 2)
CULTURE: NO GROWTH
Culture: NO GROWTH

## 2013-10-22 NOTE — Progress Notes (Signed)
10/22/13 1705  PT Visit Information  Last PT Received On 2013/10/29  PT G-Codes **NOT FOR INPATIENT CLASS**  Functional Assessment Tool Used clinical judgement   Functional Limitation Mobility: Walking and moving around  Mobility: Walking and Moving Around Current Status (I2641) CI  Mobility: Walking and Moving Around Goal Status [phone]) CI  Mobility: Walking and Moving Around Discharge Status [phone]) CI  Late G code entry [place], [place], [place]

## 2013-10-24 ENCOUNTER — Other Ambulatory Visit: Payer: Self-pay | Admitting: Cardiovascular Disease

## 2013-10-25 ENCOUNTER — Inpatient Hospital Stay: Payer: Commercial Managed Care - HMO | Admitting: Adult Health

## 2013-10-27 ENCOUNTER — Inpatient Hospital Stay: Payer: Commercial Managed Care - HMO | Admitting: Adult Health

## 2013-10-27 NOTE — Telephone Encounter (Signed)
Aware of re-admission

## 2013-11-17 ENCOUNTER — Telehealth: Payer: Self-pay | Admitting: Cardiovascular Disease

## 2013-11-17 NOTE — Telephone Encounter (Signed)
Pt have been in the hospital,very SOB and sleeps all the time. [place] is very concerned. Please call to advise whether he needs to go and he wants to drive.

## 2013-11-18 NOTE — Telephone Encounter (Signed)
I spoke with Dr. Claiborne Billings and he said it was ok to [place] to the mountains

## 2013-12-07 ENCOUNTER — Encounter: Payer: Self-pay | Admitting: Cardiovascular Disease

## 2013-12-08 ENCOUNTER — Encounter: Payer: Self-pay | Admitting: Cardiovascular Disease

## 2013-12-20 ENCOUNTER — Telehealth: Payer: Self-pay | Admitting: Cardiology

## 2013-12-20 ENCOUNTER — Encounter: Payer: Commercial Managed Care - HMO | Admitting: *Deleted

## 2013-12-20 NOTE — Telephone Encounter (Signed)
LMOVM reminding pt to send remote transmission.   

## 2013-12-21 ENCOUNTER — Encounter: Payer: Self-pay | Admitting: Cardiology

## 2013-12-26 ENCOUNTER — Other Ambulatory Visit: Payer: Self-pay | Admitting: Cardiovascular Disease

## 2013-12-27 NOTE — Telephone Encounter (Signed)
Rx was sent to pharmacy electronically. 

## 2014-01-19 ENCOUNTER — Ambulatory Visit (INDEPENDENT_AMBULATORY_CARE_PROVIDER_SITE_OTHER): Payer: Commercial Managed Care - HMO | Admitting: Cardiovascular Disease

## 2014-01-19 ENCOUNTER — Encounter: Payer: Self-pay | Admitting: Cardiovascular Disease

## 2014-01-19 VITALS — BP 122/72 | HR 71 | Ht 74.0 in | Wt 170.2 lb

## 2014-01-19 DIAGNOSIS — C61 Malignant neoplasm of prostate: Secondary | ICD-10-CM

## 2014-01-19 DIAGNOSIS — I482 Chronic atrial fibrillation, unspecified: Secondary | ICD-10-CM

## 2014-01-19 DIAGNOSIS — I059 Rheumatic mitral valve disease, unspecified: Secondary | ICD-10-CM

## 2014-01-19 DIAGNOSIS — I2510 Atherosclerotic heart disease of native coronary artery without angina pectoris: Secondary | ICD-10-CM

## 2014-01-19 DIAGNOSIS — I340 Nonrheumatic mitral (valve) insufficiency: Secondary | ICD-10-CM

## 2014-01-19 DIAGNOSIS — E785 Hyperlipidemia, unspecified: Secondary | ICD-10-CM

## 2014-01-19 DIAGNOSIS — I4891 Unspecified atrial fibrillation: Secondary | ICD-10-CM

## 2014-01-19 DIAGNOSIS — I10 Essential (primary) hypertension: Secondary | ICD-10-CM

## 2014-01-19 DIAGNOSIS — Z950 Presence of cardiac pacemaker: Secondary | ICD-10-CM

## 2014-01-19 MED ORDER — METOPROLOL SUCCINATE ER 25 MG PO TB24: ORAL_TABLET | ORAL | Status: DC

## 2014-01-19 MED ORDER — FUROSEMIDE 20 MG PO TABS: 20.0000 mg | ORAL_TABLET | Freq: Every day | ORAL | Status: DC

## 2014-01-19 NOTE — Patient Instructions (Signed)
Your physician has recommended you make the following change in your medication: do not restart the bystolic. Start the new prescription for the metoprolol succ 12.5 mg daily  Your physician recommends that you schedule a follow-up appointment in: 6 months.  Dr. Claiborne Billings recoMmends that you make a follow up appointment with your urologist  For evaluation of the increased PSA.

## 2014-01-19 NOTE — Progress Notes (Signed)
Patient ID: Norman Lee, male   DOB: [birth year], 80 y.o.   MRN: [id number]      HPI: Norman Lee is a 80 y.o. male [place] today for a 108-month cardiology evaluation.    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 He also has sick sinus syndrome as well as permanent atrial fibrillation and is status post single-chamber pacemaker implantation initially in May 2008 and in April 2014 he underwent a generator change with a current device being a Medtronic Adapta ADD RL1 device. The patient's last cardiac catheterization was in 2008 which showed progressive 70% eccentric mid right artery stenosis. Circumflex intervention sites were patent although he had residual disease 20-30% proximally the marginal vessel had 50-60% in the AV groove. It 30% LAD stenosis. 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 He has developed severe mitral regurgitation most likely due to a flail leaflet of the middle scallop of the posterior limb leaflet. We have discussed potential valve repair but ultimately he has refused this. I had seen him in March we had discussed about the possibility of cardiac catheterization for further evaluation. He initially had decided against this but then when I last saw him in October 2014, he was considering the possibility of undergoing a repeat cardiac catheterization and possible evaluation for mitral valve surgery.  The right and left heart catheterization was tentatively scheduled, but he subsequently canceled this and it was not perform.  He  has a history of prostate CA apparently he underwent cryoablation 2011 by Dr. Gaynelle Arabian but his PSAs have been slowly rising again.  When I last saw him in May 2015 he had a significant cough,  development of at times greenish sputum, and possibly low-grade temperature.  He ultimately was hospitalized with pneumonia.  Recently, he states he ran out of his Bystolic, approximately 2 weeks ago.  He has been taking ramipril, 10 mg, simvastatin 20 mg, Coumadin 5 mg, Lasix, 20 mg in addition to aspirin.  He also states he has developed a left inguinal hernia.  He has difficulty with chronic back problems.  He recently had laboratory drawn, which showed a creatinine of 1.37.  Estimated GFR was 48.  His lipids were excellent with a total cholesterol of 118 triglycerides 84, HDL 50, and LDL cholesterol 51.  He does have a history of prostate CA.  His PSA level has now further increased to 15.    Past Medical History  Diagnosis Date  . Prostate cancer   . CAD (coronary artery disease)     Circumflex MI 1995, Last cath 2008  . Mitral regurgitation     Severe  . HLD (hyperlipidemia)   . HTN (hypertension)   . Atrial fibrillation   . Pulmonary hypertension   . Presence of permanent cardiac pacemaker 10/16/2006,09/04/12    Medtronic adapta  . Chronic anticoagulation   . Dysrhythmia     ATRIAL FIBRILATION  . Pacemaker     Past Surgical History  Procedure Laterality Date  . Permanent pacemaker insertion  10/16/2006    Medtronic Enrhythm  . Permanent pacemaker generator change  09/04/2012    Medtronic adapta  . Coronary angioplasty  09/16/1993    100% CX-successful  . Cardiac catheterization  05/11/2002    70-80% small first diagonal,50% RCA  .  Cardiac catheterization  10/14/2006    multivessell,EF 40-50%  . Nm myoview ltd  08/30/2009    mod. perfusion defect-mid inferior,apical inferior,basal inferolateral,mid inferolateral & apical lateral regions  . US echocardiography  03/27/2012    severe MR persists w/progressive LA dilatation,RSVP has increased & AS has progressed    Allergies  Allergen Reactions  . Codeine Nausea And Vomiting    Current Outpatient Prescriptions  Medication  Sig Dispense Refill  . aspirin 81 MG tablet Take 81 mg by mouth daily.      . furosemide (LASIX) 20 MG tablet Take 20 mg by mouth daily.       . nebivolol (BYSTOLIC) 5 MG tablet Take 5 mg by mouth daily.      . ramipril (ALTACE) 10 MG capsule TAKE 1 CAPSULE (10 MG TOTAL) BY MOUTH DAILY.  90 capsule  2  . simvastatin (ZOCOR) 20 MG tablet Take 20 mg by mouth every morning.      . warfarin (COUMADIN) 5 MG tablet Take 5 mg by mouth every morning. 2.5mg  Monday, Wednesday, and Friday.  5mg  all other days       No current facility-administered medications for this visit.    History   Social History  . Marital Status: Married    Spouse Name: N/A    Number of Children: N/A  . Years of Education: N/A   Occupational History  . Not on file.   Social History Main Topics  . Smoking status: Never Smoker   . Smokeless tobacco: Never Used  . Alcohol Use: No  . Drug Use: No  . Sexual Activity: Not on file   Other Topics Concern  . Not on file   Social History Narrative  . No narrative on file    Family History  Problem Relation Age of Onset  . Diabetes Sister    ROS  General: positive fevers, chills, no night sweats;  HEENT: Decreased hearing; No changes in vision, , difficulty swallowing  Pulmonary: positive for cough, wheezing, shortness of breath, no hemoptysis  Cardiovascular: Positive fior shortness of breath ; No chest pain, presyncope, syncope, palpatations  GI: Negative; No nausea, vomiting, diarrhea, or abdominal pain  GU: Positive for development of a left inguinal hernia; No dysuria, hematuria, or difficulty voiding  Musculoskeletal: Positive for chronic back problems.; no myalgias, joint pain, or weakness  Hematologic/Oncology: Positive for prostate Ca; no easy bruising, bleeding  Endocrine: Negative; no heat/cold intolerance  Neuro: Negative; no changes in balance, headaches  Skin: Negative; No rashes or skin lesions  Psychiatric: Negative; No behavioral problems,  depression  Sleep: Negative; No snoring, daytime sleepiness, hypersomnolence, bruxism, restless legs, hypnogognic hallucinations, no cataplexy  Other comprehensive 14 review of systems was negative    PE BP 122/72  Pulse 71  Ht 6\' 2"  (1.88 m)  Wt 170 lb 3.2 oz (77.202 kg)  BMI 21.84 kg/m2  General: Alert, oriented, no distress.  Skin: normal turgor, no rashes HEENT: Normocephalic, atraumatic. Pupils round and reactive; sclera anicteric;no lid lag.  Nose without nasal septal hypertrophy Mouth/Parynx benign; Mallinpatti scale 3 Chest wall: Nontender to palpation Neck: No JVD, no carotid bruits Lungs: No wheezing.  No rales Heart: Regular, paced rhythm at 70 beats per minute, s1 s2 normal 2-0/2 holosystolic murmur at the apex radiating to the axilla concordant with his severe mitral regurgitation. Abdomen: soft, nontender; no hepatosplenomehaly, BS+; abdominal aorta nontender and not dilated by palpation. Back: No CVA tenderness Pulses 2+ Extremities: no clubbing cyanosis or edema, Homan's  sign negative  Neurologic: grossly nonfocal Psychological: Intact mood and affect  ECG (independently read by me): Underlying atrial fibrillation, ventricular paced rhythm at 71 beats per minute  Prior May 2050 ECG  (independently read by me): Ventricular paced rhythm with underlying AF  LABS:  BMET    Component Value Date/Time   NA 139 10/19/2013 1502   K 4.1 10/19/2013 1502   CL 98 10/19/2013 1502   CO2 27 10/19/2013 1458   GLUCOSE 123* 10/19/2013 1502   BUN 41* 10/19/2013 1502   CREATININE 1.50* 10/19/2013 1502   CREATININE 1.23 10/28/2012 1135   CALCIUM 9.4 10/19/2013 1458   GFRNONAA 47* 10/19/2013 1458   GFRAA 54* 10/19/2013 1458     Hepatic Function Panel     Component Value Date/Time   PROT 6.4 10/19/2013 1458   ALBUMIN 3.4* 10/19/2013 1458   AST 28 10/19/2013 1458   ALT 23 10/19/2013 1458   ALKPHOS 42 10/19/2013 1458   BILITOT 1.1 10/19/2013 1458   BILIDIR 0.2 10/28/2012 1135   IBILI 0.9 10/28/2012  1135     CBC    Component Value Date/Time   WBC 9.6 10/19/2013 1458   RBC 4.36 10/19/2013 1458   HGB 13.9 10/19/2013 1502   HCT 41.0 10/19/2013 1502   PLT 192 10/19/2013 1458   MCV 91.7 10/19/2013 1458   MCH 32.6 10/19/2013 1458   MCHC 35.5 10/19/2013 1458   RDW 14.3 10/19/2013 1458   LYMPHSABS 1.7 10/19/2013 1458   MONOABS 1.2* 10/19/2013 1458   EOSABS 0.0 10/19/2013 1458   BASOSABS 0.0 10/19/2013 1458     BNP    Component Value Date/Time   PROBNP 7399.0* 10/17/2013 0455    Lipid Panel     Component Value Date/Time   CHOL 106 10/20/2013 0517   TRIG 89 10/20/2013 0517   HDL 28* 10/20/2013 0517   CHOLHDL 3.8 10/20/2013 0517   VLDL 18 10/20/2013 0517   LDLCALC 60 10/20/2013 0517     RADIOLOGY: No results found.    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 He is documented upper lateral wall scar from this event. His last cardiac catheterization was 2008 . His last nuclear perfusion study was March 2011 which showed his old left circumflex inferior to inferolateral scar without associated ischemia. He is status post pacemaker generator change secondary to previous end of life on his battery. He does have severe mitral regurgitation most likely due to a flail leaflet. He has previously decided against consideration for mitral valve repair. He is followed by Dr. Gaynelle Arabian for his rising PSA levels and is status post cryoablation in 2011 4 prostate CA. Marland Kitchen  His PSA level has now further increased to 15.  I have suggested he followup with Dr. Gaynelle Arabian.  He did stop his Bystolic 5 mg when he ran out 2 weeks ago.  His resting pulse is paced at 70 beats per minute, and he has underlying atrial fibrillation.  He does note significant shortness of breath with activity.  I have suggested initiating very low dose metoprolol succinate at 12.5 mg daily since he was  concerned about the cost of Bystolic.  This should hopefully prevent increased heart rate with his underlying atrial fibrillation, but at the same time.  Also, provide potential anti-ischemic benefit in light of his concomitant coronary obstructive disease.  He is on chronic warfarin  therapy and is in monitoring INR levels, which have been therapeutic.  I will see him in 6 months for cardiology evaluation.   Troy Sine, MD, [facility]  01/19/2014 3:40 PM

## 2014-01-31 ENCOUNTER — Telehealth: Payer: Self-pay | Admitting: *Deleted

## 2014-01-31 NOTE — Telephone Encounter (Signed)
Faxed  Signed [facility] reappointment application  Back to  Patient's office @ [phone].

## 2014-02-04 ENCOUNTER — Other Ambulatory Visit: Payer: Self-pay | Admitting: Cardiovascular Disease

## 2014-03-29 ENCOUNTER — Encounter: Payer: Self-pay | Admitting: Cardiology

## 2014-04-04 ENCOUNTER — Encounter: Payer: Self-pay | Admitting: Cardiovascular Disease

## 2014-04-04 ENCOUNTER — Ambulatory Visit (INDEPENDENT_AMBULATORY_CARE_PROVIDER_SITE_OTHER): Payer: Commercial Managed Care - HMO | Admitting: *Deleted

## 2014-04-04 DIAGNOSIS — I482 Chronic atrial fibrillation, unspecified: Secondary | ICD-10-CM

## 2014-04-05 ENCOUNTER — Telehealth: Payer: Self-pay | Admitting: Cardiovascular Disease

## 2014-04-05 NOTE — Progress Notes (Signed)
Remote pacemaker transmission.   

## 2014-04-06 NOTE — Telephone Encounter (Signed)
Closed encounter °

## 2014-04-07 LAB — MDC_IDC_ENUM_SESS_TYPE_REMOTE
Battery Impedance: 108 Ohm
Battery Remaining Longevity: 154 mo
Brady Statistic RV Percent Paced: 99 %
Date Time Interrogation Session: 20151019205954
Lead Channel Impedance Value: 443 Ohm
Lead Channel Pacing Threshold Amplitude: 0.75 V
Lead Channel Setting Sensing Sensitivity: 2.8 mV
MDC IDC MSMT BATTERY VOLTAGE: 2.78 V
MDC IDC MSMT LEADCHNL RA IMPEDANCE VALUE: 67 Ohm
MDC IDC MSMT LEADCHNL RV PACING THRESHOLD PULSEWIDTH: 0.4 ms
MDC IDC SET LEADCHNL RV PACING AMPLITUDE: 2 V
MDC IDC SET LEADCHNL RV PACING PULSEWIDTH: 0.4 ms

## 2014-04-14 ENCOUNTER — Encounter: Payer: Self-pay | Admitting: Cardiology

## 2014-05-07 ENCOUNTER — Other Ambulatory Visit: Payer: Self-pay | Admitting: Cardiovascular Disease

## 2014-05-09 NOTE — Telephone Encounter (Signed)
Rx was sent to pharmacy electronically. 

## 2014-05-26 ENCOUNTER — Encounter (HOSPITAL_COMMUNITY): Payer: Self-pay | Admitting: Cardiovascular Disease

## 2014-05-31 ENCOUNTER — Encounter: Payer: Commercial Managed Care - HMO | Admitting: Cardiovascular Disease

## 2014-06-20 ENCOUNTER — Ambulatory Visit: Payer: Commercial Managed Care - HMO | Admitting: Physician Assistant

## 2014-06-21 ENCOUNTER — Telehealth: Payer: Self-pay | Admitting: Cardiovascular Disease

## 2014-06-21 NOTE — Telephone Encounter (Signed)
Please call.

## 2014-06-21 NOTE — Telephone Encounter (Signed)
Called patient twice. Left messages that call was returned. He can call back tomorrow and speak with a triage nurse,  I will be out of the office. Or he can call me again on Thursday if he needs to specifically speak with me.

## 2014-06-27 ENCOUNTER — Telehealth: Payer: Self-pay | Admitting: Cardiovascular Disease

## 2014-06-27 DIAGNOSIS — E785 Hyperlipidemia, unspecified: Secondary | ICD-10-CM

## 2014-06-27 DIAGNOSIS — I4891 Unspecified atrial fibrillation: Secondary | ICD-10-CM

## 2014-06-27 DIAGNOSIS — Z7901 Long term (current) use of anticoagulants: Secondary | ICD-10-CM

## 2014-06-27 DIAGNOSIS — I10 Essential (primary) hypertension: Secondary | ICD-10-CM

## 2014-06-27 DIAGNOSIS — I2510 Atherosclerotic heart disease of native coronary artery without angina pectoris: Secondary | ICD-10-CM

## 2014-06-27 DIAGNOSIS — C61 Malignant neoplasm of prostate: Secondary | ICD-10-CM

## 2014-06-27 NOTE — Telephone Encounter (Signed)
Left message for patient, [facility] lab orders faxed to the number provided.

## 2014-06-27 NOTE — Telephone Encounter (Signed)
Pt's wife called in stating she would like some lab orders for the pt faxed to his office. Those labs would be his  MET B Liver function PSA Protime A1C  Fax number is [phone] Thanks

## 2014-07-11 ENCOUNTER — Other Ambulatory Visit: Payer: Self-pay | Admitting: Cardiovascular Disease

## 2014-07-11 NOTE — Telephone Encounter (Signed)
Rx(s) sent to pharmacy electronically.  

## 2014-07-20 ENCOUNTER — Other Ambulatory Visit: Payer: Self-pay | Admitting: Cardiovascular Disease

## 2014-07-21 LAB — LIPID PANEL W/O CHOL/HDL RATIO
CHOLESTEROL TOTAL: 120 mg/dL (ref 100–199)
HDL: 52 mg/dL (ref >39)
LDL Calculated: 51 mg/dL (ref 0–99)
Triglycerides: 85 mg/dL (ref 0–149)
VLDL CHOLESTEROL CAL: 17 mg/dL (ref 5–40)

## 2014-07-21 LAB — BASIC METABOLIC PANEL
BUN / CREAT RATIO: 16 (ref 10–22)
BUN: 20 mg/dL (ref 8–27)
CO2: 22 mmol/L (ref 18–29)
CREATININE: 1.24 mg/dL (ref 0.76–1.27)
Calcium: 9.9 mg/dL (ref 8.6–10.2)
Chloride: 96 mmol/L — ABNORMAL LOW (ref 97–108)
GFR calc Af Amer: 62 mL/min/{1.73_m2} (ref >59)
GFR calc non Af Amer: 53 mL/min/{1.73_m2} — ABNORMAL LOW (ref >59)
GLUCOSE: 83 mg/dL (ref 65–99)
Potassium: 4 mmol/L (ref 3.5–5.2)
SODIUM: 138 mmol/L (ref 134–144)

## 2014-07-21 LAB — HEPATIC FUNCTION PANEL
ALT: 13 IU/L (ref 0–44)
AST: 19 IU/L (ref 0–40)
Albumin: 4.5 g/dL (ref 3.5–4.7)
Alkaline Phosphatase: 68 IU/L (ref 39–117)
BILIRUBIN DIRECT: 0.52 mg/dL — AB (ref 0.00–0.40)
TOTAL PROTEIN: 7 g/dL (ref 6.0–8.5)
Total Bilirubin: 1.5 mg/dL — ABNORMAL HIGH (ref 0.0–1.2)

## 2014-07-21 LAB — PSA: PSA: 16 ng/mL — ABNORMAL HIGH (ref 0.0–4.0)

## 2014-07-21 LAB — PROTIME-INR
INR: 5.7 — AB (ref 0.8–1.2)
Prothrombin Time: 59.5 s — ABNORMAL HIGH (ref 9.1–12.0)

## 2014-08-05 ENCOUNTER — Ambulatory Visit: Payer: Commercial Managed Care - HMO | Admitting: Cardiovascular Disease

## 2014-08-09 ENCOUNTER — Ambulatory Visit (INDEPENDENT_AMBULATORY_CARE_PROVIDER_SITE_OTHER): Payer: Commercial Managed Care - HMO | Admitting: Cardiovascular Disease

## 2014-08-09 VITALS — BP 128/78 | HR 74 | Ht 74.0 in | Wt 171.3 lb

## 2014-08-09 DIAGNOSIS — Z7901 Long term (current) use of anticoagulants: Secondary | ICD-10-CM

## 2014-08-09 DIAGNOSIS — R609 Edema, unspecified: Secondary | ICD-10-CM

## 2014-08-09 DIAGNOSIS — R600 Localized edema: Secondary | ICD-10-CM

## 2014-08-09 DIAGNOSIS — I482 Chronic atrial fibrillation, unspecified: Secondary | ICD-10-CM

## 2014-08-09 DIAGNOSIS — I2583 Coronary atherosclerosis due to lipid rich plaque: Secondary | ICD-10-CM

## 2014-08-09 DIAGNOSIS — C61 Malignant neoplasm of prostate: Secondary | ICD-10-CM

## 2014-08-09 DIAGNOSIS — Z950 Presence of cardiac pacemaker: Secondary | ICD-10-CM

## 2014-08-09 DIAGNOSIS — Z79899 Other long term (current) drug therapy: Secondary | ICD-10-CM

## 2014-08-09 DIAGNOSIS — I2510 Atherosclerotic heart disease of native coronary artery without angina pectoris: Secondary | ICD-10-CM

## 2014-08-09 DIAGNOSIS — E785 Hyperlipidemia, unspecified: Secondary | ICD-10-CM

## 2014-08-09 MED ORDER — FUROSEMIDE 40 MG PO TABS: ORAL_TABLET | ORAL | Status: DC

## 2014-08-09 NOTE — Patient Instructions (Signed)
Your physician has recommended you make the following change in your medication: furosemide had been increased . Follow instructions  on the bottle.  Your physician recommends that you return for lab work in: 2 weeks.  Your physician recommends that you schedule a follow-up appointment in: 6 weeks with Dr. Claiborne Billings.  Compression Stockings Compression stockings are elastic stockings that "compress" your legs. This helps to increase blood flow, decrease swelling, and reduces the chance of getting blood clots in your lower legs. Compression stockings are used:  After surgery.  If you have a history of poor circulation.  If you are prone to blood clots.  If you have varicose veins.  If you sit or are bedridden for long periods of time. WEARING COMPRESSION STOCKINGS  Your compression stockings should be worn as instructed by your caregiver.  Wearing the correct stocking size is important. Your caregiver can help measure and fit you to the correct size.  When wearing your stockings, do not allow the stockings to bunch up. This is especially important around your toes or behind your knees. Keep the stockings as smooth as possible.  Do not roll the stockings downward and leave them rolled down. This can form a restrictive band around your legs and can decrease blood flow.  The stockings should be removed once a day for 1 hour or as instructed by your caregiver. When the stockings are taken off, inspect your legs and feet. Look for:  Open sores.  Red spots.  Puffy areas (swelling).  Anything that does not seem normal. IMPORTANT INFORMATION ABOUT COMPRESSION STOCKINGS  The compression stockings should be clean, dry, and in good condition before you put them on.  Do not put lotion on your legs or feet. This makes it harder to put the stockings on.  Change your stockings immediately if they become wet or soiled.  Do not wear stockings that are ripped or torn.  You may hand-wash or put  your stockings in the washing machine. Use cold or warm water with mild detergent. Do not bleach your stockings. They may be air-dried or dried in the dryer on low heat.  If you have pain or have a feeling of "pins and needles" in your feet or legs, you may be wearing stockings that are too tight. Call your caregiver right away. SEEK IMMEDIATE MEDICAL CARE IF:   You have numbness or tingling in your lower legs that does not get better quickly after the stockings are removed.  Your toes or feet become cold and blue.  You develop open sores or have red spots on your legs that do not go away. MAKE SURE YOU:   Understand these instructions.  Will watch your condition.  Will get help right away if you are not doing well or get worse. Document Released: 03/31/2009 Document Revised: 08/26/2011 Document Reviewed: 03/31/2009 [employer] Patient Information 2015 [place], [place]. This information is not intended to replace advice given to you by your health care provider. Make sure you discuss any questions you have with your health care provider.

## 2014-08-10 ENCOUNTER — Encounter: Payer: Self-pay | Admitting: Cardiovascular Disease

## 2014-08-10 DIAGNOSIS — R600 Localized edema: Secondary | ICD-10-CM | POA: Insufficient documentation

## 2014-08-10 NOTE — Progress Notes (Signed)
Patient ID: Norman Lee, male   DOB: [birth year], 81 y.o.   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 He also has sick sinus syndrome as well as permanent atrial fibrillation and is status post single-chamber pacemaker implantation initially in May 2008 and in April 2014 he underwent a generator change with a current device being a Medtronic Adapta ADD RL1 device. The patient's last cardiac catheterization was in 2008 which showed progressive 70% eccentric mid right artery stenosis. Circumflex intervention sites were patent although he had residual disease 20-30% proximally, the marginal vessel had 50-60% in the AV groove and there was 30% LAD stenosis. He has an upper lateral wall aneurysm from his initial late presentation left circumflex infarction. He has developed severe mitral regurgitation most likely due to a flail leaflet of the middle scallop of the posterior limb leaflet. We have discussed potential valve repair but ultimately he has refused this.   He  has a history of prostate CA apparently he underwent cryoablation 2011 by Dr. Gaynelle Arabian but his PSAs have been slowly rising again.  Recent laboratory from 2 weeks ago revealed his PSA had risen to 16.0.  He tells me he will be having a follow-up evaluation with Dr. Gaynelle Arabian.  In May 2015 he was hospitalized with pneumonia.  He also states he has developed a left inguinal hernia.  He has difficulty with chronic back problems.  As I last saw him, he admits to exertional shortness of breath.  However, over the past 2 months he has noticed progressive lower extremity edema.  He denies any chest pressure.  He has only been taking Lasix 20 mg daily.  He also has been  taking REM appeared oh 10 mg daily and Toprol XL 12.5 mg for blood pressure.  His lipids have been treated with simvastatin.  He is on chronic Coumadin therapy.  Last week his INR was supratherapeutic at 5.7.  An INR was drawn today in the office and this was 3.5.   Past Medical History  Diagnosis Date  . Prostate cancer   . CAD (coronary artery disease)     Circumflex MI 1995, Last cath 2008  . Mitral regurgitation     Severe  . HLD (hyperlipidemia)   . HTN (hypertension)   . Atrial fibrillation   . Pulmonary hypertension   . Presence of permanent cardiac pacemaker 10/16/2006,09/04/12    Medtronic adapta  . Chronic anticoagulation   . Dysrhythmia     ATRIAL FIBRILATION  . Pacemaker     Past Surgical History  Procedure Laterality Date  . Permanent pacemaker insertion  10/16/2006    Medtronic Enrhythm  . Permanent pacemaker generator change  09/04/2012    Medtronic adapta  . Coronary angioplasty  09/16/1993    100% CX-successful  . Cardiac catheterization  05/11/2002    70-80% small first diagonal,50% RCA  . Cardiac catheterization  10/14/2006    multivessell,EF 40-50%  . Nm myoview ltd  08/30/2009    mod. perfusion defect-mid inferior,apical inferior,basal inferolateral,mid inferolateral & apical lateral regions  . US echocardiography  03/27/2012    severe MR persists w/progressive LA dilatation,RSVP has increased & AS has progressed  . Permanent pacemaker generator change N/A  09/04/2012    Procedure: PERMANENT PACEMAKER GENERATOR CHANGE;  Surgeon: Sanda Klein, MD;  Location: [facility];  Service: Cardiovascular;  Laterality: N/A;    Allergies  Allergen Reactions  . Codeine Nausea And Vomiting    Current Outpatient Prescriptions  Medication Sig Dispense Refill  . aspirin 81 MG tablet Take 81 mg by mouth daily.    . metoprolol succinate (TOPROL XL) 25 MG 24 hr tablet Take 1/2 tablet daily 45 tablet 3  . ramipril (ALTACE) 10 MG capsule TAKE 1 CAPSULE (10 MG TOTAL) BY MOUTH  DAILY. 90 capsule 2  . simvastatin (ZOCOR) 20 MG tablet TAKE 1 TABLET (20 MG TOTAL) BY MOUTH AT BEDTIME. 90 tablet 3  . warfarin (COUMADIN) 5 MG tablet TAKE 1 TABLET BY MOUTH DAILY OR AS DIRECTED 90 tablet 1  . furosemide (LASIX) 40 MG tablet Take 1 tablet twice a day for 2 days then 1 tablet daily. 100 tablet 3   No current facility-administered medications for this visit.    History   Social History  . Marital Status: Married    Spouse Name: N/A  . Number of Children: N/A  . Years of Education: N/A   Occupational History  . Not on file.   Social History Main Topics  . Smoking status: Never Smoker   . Smokeless tobacco: Never Used  . Alcohol Use: No  . Drug Use: No  . Sexual Activity: Not on file   Other Topics Concern  . Not on file   Social History Narrative    Family History  Problem Relation Age of Onset  . Diabetes Sister    ROS  General: positive fevers, chills, no night sweats;  HEENT: Decreased hearing; No changes in vision, , difficulty swallowing  Pulmonary: positive for cough, wheezing, shortness of breath, no hemoptysis  Cardiovascular: Positive fior shortness of breath and progressive lower extremity edema ; No chest pain, presyncope, syncope, palpatations  GI: Negative; No nausea, vomiting, diarrhea, or abdominal pain  GU: Positive for development of a left inguinal hernia; positive for prostate CA No dysuria, hematuria, or difficulty voiding  Musculoskeletal: Positive for chronic back problems.; no myalgias, joint pain, or weakness  Hematologic/Oncology: Positive for prostate Ca; no easy bruising, bleeding  Endocrine: Negative; no heat/cold intolerance  Neuro: Negative; no changes in balance, headaches  Skin: Negative; No rashes or skin lesions  Psychiatric: Negative; No behavioral problems, depression  Sleep: Negative; No snoring, daytime sleepiness, hypersomnolence, bruxism, restless legs, hypnogognic hallucinations, no cataplexy  Other  comprehensive 14 review of systems was negative    PE BP 128/78 mmHg  Pulse 74  Ht $R'6\' 2"'YH$  (1.88 m)  Wt 171 lb 4.8 oz (77.701 kg)  BMI 21.98 kg/m2  General: Alert, oriented, no distress.  Skin: Pilonidal cyst in the subxiphoid region HEENT: Normocephalic, atraumatic. Pupils round and reactive; sclera anicteric;no lid lag.  Nose without nasal septal hypertrophy Mouth/Parynx benign; Mallinpatti scale 3 Chest wall: Nontender to palpation Neck: No JVD, no carotid bruits Lungs: No wheezing.  No rales Heart: Regular, paced rhythm at 70 beats per minute, s1 s2 normal 0-7/1 holosystolic murmur at the apex radiating to the axilla concordant with his severe mitral regurgitation. Abdomen: soft, nontender; no hepatosplenomehaly, BS+; abdominal aorta nontender and not dilated by palpation. Back: No CVA tenderness Pulses 2+ Extremities: New 3+ pitting edema to the lower extremities, Homan's sign negative  Neurologic: grossly nonfocal Psychological: Intact mood and affect   ECG (independently read by me): Ventricular paced rhythm at 74  bpm.  100% pacing.  Underlying atrial fibrillation.  01/19/2014 ECG (independently read by me): Underlying atrial fibrillation, ventricular paced rhythm at 71 beats per minute  Prior May 2050 ECG  (independently read by me): Ventricular paced rhythm with underlying AF  LABS:  BMET  BMP Latest Ref Rng 07/20/2014 10/19/2013 10/19/2013  Glucose 65 - 99 mg/dL 83 123(H) 127(H)  BUN 8 - 27 mg/dL 20 41(H) 46(H)  Creatinine 0.76 - 1.27 mg/dL 1.24 1.50(H) 1.36(H)  BUN/Creat Ratio 10 - 22 16 - -  Sodium 134 - 144 mmol/L 138 139 138  Potassium 3.5 - 5.2 mmol/L 4.0 4.1 4.4  Chloride 97 - 108 mmol/L 96(L) 98 100  CO2 18 - 29 mmol/L 22 - 27  Calcium 8.6 - 10.2 mg/dL 9.9 - 9.4     Hepatic Function Panel    Hepatic Function Latest Ref Rng 07/20/2014 10/19/2013 10/16/2013  Total Protein 6.0 - 8.5 g/dL 7.0 6.4 7.7  Albumin 3.5 - 5.2 g/dL - 3.4(L) 4.2  AST 0 - 40 IU/L $Remov'19 28 20    'gTEnlu$ ALT 0 - 44 IU/L $Remov'13 23 12  'ekItti$ Alk Phosphatase 39 - 117 IU/L 68 42 62  Total Bilirubin 0.0 - 1.2 mg/dL 1.5(H) 1.1 1.8(H)  Bilirubin, Direct 0.00 - 0.40 mg/dL 0.52(H) - -    CBC  CBC Latest Ref Rng 10/19/2013 10/19/2013 10/17/2013  WBC 4.0 - 10.5 K/uL - 9.6 9.8  Hemoglobin 13.0 - 17.0 g/dL 13.9 14.2 13.2  Hematocrit 39.0 - 52.0 % 41.0 40.0 38.6(L)  Platelets 150 - 400 K/uL - 192 142(L)     BNP    Component Value Date/Time   PROBNP 7399.0* 10/17/2013 0455    Lipid Panel     Component Value Date/Time   CHOL 106 10/20/2013 0517   TRIG 85 07/20/2014 1005   HDL 52 07/20/2014 1005   HDL 28* 10/20/2013 0517   CHOLHDL 3.8 10/20/2013 0517   VLDL 18 10/20/2013 0517   LDLCALC 51 07/20/2014 1005   LDLCALC 60 10/20/2013 0517   PSA: 16.0  RADIOLOGY: No results found.    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 He is documented upper lateral wall scar from this event. His last cardiac catheterization was 2008 . His last nuclear perfusion study was March 2011 which showed his old left circumflex inferior to inferolateral scar without associated ischemia. He is status post pacemaker generator change secondary to previous end of life on his battery. He does have severe mitral regurgitation most likely due to a flail leaflet. He has  decided against consideration for mitral valve repair. He is followed by Dr. Gaynelle Arabian for his rising PSA levels and is status post cryoablation in 2011 for prostate CA. Marland Kitchen  His PSA level has now further increased to 16 and he will be seeing  Dr. Gaynelle Arabian for follow-up evaluation.  His atrial fibrillation rate is controlled.  He is on Coumadin anticoagulation, but this has been supratherapeutic.  I recommended he reduce his Coumadin dosing and take 2.5 mg 5 days per week and 5 mg 2 days per week.  A follow-up  INR will need to be obtained in 1-2 weeks.  He has only been taking Lasix 20 mg.  He now has 3+ pitting edema.  For the next 2 days I recommended he increase his Lasix to 40 mg twice a day and then he  will take this 40 mg daily.  However, he will can take an extra dose in the afternoon as needed depending upon his leg swelling.  I've also recommended 20-30 mm pressure support stockings for significant edema.  Follow-up be met 9 are will be obtained in 1-2 weeks.  I will see him in 6 weeks for cardiology reevaluation.   Troy Sine, MD, [facility]  08/10/2014 7:57 PM

## 2014-08-24 ENCOUNTER — Telehealth: Payer: Self-pay | Admitting: Cardiovascular Disease

## 2014-08-25 ENCOUNTER — Telehealth: Payer: Self-pay | Admitting: Cardiovascular Disease

## 2014-08-25 NOTE — Telephone Encounter (Signed)
Pt's wife called in stating that the pt is still having some edema and would like to know if a potassium medication could be called in for the pt. She stated that he can not take potassium OTC. Please call  Thanks

## 2014-08-25 NOTE — Telephone Encounter (Signed)
Closed encounter °

## 2014-08-25 NOTE — Telephone Encounter (Signed)
Called, went directly to answering machine. Unable to leave message.

## 2014-08-31 ENCOUNTER — Encounter: Payer: Self-pay | Admitting: Diagnostic Neuroimaging

## 2014-08-31 ENCOUNTER — Ambulatory Visit (INDEPENDENT_AMBULATORY_CARE_PROVIDER_SITE_OTHER): Payer: Commercial Managed Care - HMO | Admitting: Diagnostic Neuroimaging

## 2014-08-31 VITALS — BP 122/70 | HR 91 | Ht 74.0 in | Wt 137.2 lb

## 2014-08-31 DIAGNOSIS — R269 Unspecified abnormalities of gait and mobility: Secondary | ICD-10-CM | POA: Diagnosis not present

## 2014-08-31 DIAGNOSIS — M5416 Radiculopathy, lumbar region: Secondary | ICD-10-CM

## 2014-08-31 DIAGNOSIS — G609 Hereditary and idiopathic neuropathy, unspecified: Secondary | ICD-10-CM | POA: Diagnosis not present

## 2014-08-31 NOTE — Progress Notes (Signed)
[facility]  PATIENT: Norman Lee DOB: 14-Jun-1929  REFERRING CLINICIAN: Masoud HISTORY FROM: patient and friend (office receptionist) REASON FOR VISIT: new consult    HISTORICAL  CHIEF COMPLAINT:  Chief Complaint  Patient presents with  . Extremity Weakness    Pt states left leg is weaker then right leg  . 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 Patient has history of hypertension, hypercholesteremia, heart disease, atrial fibrillation, TIA versus syncope event in May 2015, prostate cancer, pacemaker placement.  Patient is still practicing medicine. He had been doing well until May 2015 when he had a syncopal event, possible TIA. At that time he was found slumped over at home, taken to the hospital and then found to have right side weakness and aphasia, INR 1.96, based on review of the chart. Patient had full evaluation and was discharged home on medical therapy. Since that time he has been having progressively worsening balance and gait difficulty. Over the last 1-3 months he has had particular difficulty, having to hold onto the walls and objects for balance. He tried using a cane but this did not provide no support. Now he is been using a walker for the past few weeks. He feels generalized weakness in bilateral legs. He feels like his legs won't do what he tells him to do. He has some generalized balance difficulty, feels difficulty sensing his position of his feet on the floor.  He denies any tremor, memory problems, headache, upper extremity problems. He has chronic low back pain, which has been worsening lately. He denies any pain radiating into his legs.   REVIEW OF SYSTEMS: Full 14 system review of systems performed and notable only  for weight loss fatigue swelling in legs hearing loss shortness of breath constipation dizziness not enough sleep restless legs weakness dizziness.   ALLERGIES: Allergies  Allergen Reactions  . Codeine Nausea And Vomiting    HOME MEDICATIONS: Outpatient Prescriptions Prior to Visit  Medication Sig Dispense Refill  . aspirin 81 MG tablet Take 81 mg by mouth daily.    . furosemide (LASIX) 40 MG tablet Take 1 tablet twice a day for 2 days then 1 tablet daily. 100 tablet 3  . metoprolol succinate (TOPROL XL) 25 MG 24 hr tablet Take 1/2 tablet daily 45 tablet 3  . ramipril (ALTACE) 10 MG capsule TAKE 1 CAPSULE (10 MG TOTAL) BY MOUTH DAILY. 90 capsule 2  . simvastatin (ZOCOR) 20 MG tablet TAKE 1 TABLET (20 MG TOTAL) BY MOUTH AT BEDTIME. 90 tablet 3  . warfarin (COUMADIN) 5 MG tablet TAKE 1 TABLET BY MOUTH DAILY OR AS DIRECTED (Patient taking differently: TAKE 1 TABLET BY MOUTH DAILY OR AS DIRECTED. 2.5mg  5 days a week and then 5mg  other two days (sat, sun)) 90 tablet 1   No facility-administered medications prior to visit.    PAST MEDICAL HISTORY: Past Medical History  Diagnosis Date  . Prostate cancer   . CAD (coronary artery disease)     Circumflex MI 1995, Last cath 2008  . Mitral regurgitation     Severe  . HLD (hyperlipidemia)   . HTN (hypertension)   . Atrial fibrillation   . Pulmonary hypertension   . Presence of permanent cardiac pacemaker 10/16/2006,09/04/12    Medtronic adapta  .  Chronic anticoagulation   . Dysrhythmia     ATRIAL FIBRILATION  . Pacemaker     PAST SURGICAL HISTORY: Past Surgical History  Procedure Laterality Date  . Permanent pacemaker insertion  10/16/2006    Medtronic Enrhythm  . Permanent pacemaker generator change  09/04/2012    Medtronic adapta  . Coronary angioplasty  09/16/1993    100% CX-successful  . Cardiac catheterization  05/11/2002    70-80% small first diagonal,50% RCA  . Cardiac catheterization  10/14/2006    multivessell,EF 40-50%  .  Nm myoview ltd  08/30/2009    mod. perfusion defect-mid inferior,apical inferior,basal inferolateral,mid inferolateral & apical lateral regions  . US echocardiography  03/27/2012    severe MR persists w/progressive LA dilatation,RSVP has increased & AS has progressed  . Permanent pacemaker generator change N/A 09/04/2012    Procedure: PERMANENT PACEMAKER GENERATOR CHANGE;  Surgeon: Sanda Klein, MD;  Location: [facility];  Service: Cardiovascular;  Laterality: N/A;  . Cholecystectomy      FAMILY HISTORY: Family History  Problem Relation Age of Onset  . Diabetes Sister     SOCIAL HISTORY:  History   Social History  . Marital Status: Married    Spouse Name: [name]  . Number of Children: 1  . Years of Education: Doctorate   Occupational History  . Not on file.   Social History Main Topics  . Smoking status: Never Smoker   . Smokeless tobacco: Never Used  . Alcohol Use: No  . Drug Use: No  . Sexual Activity: Not on file   Other Topics Concern  . Not on file   Social History Narrative   Lives at home with wife.    Caffeine use: drinks coffee (2-3 cups per day)     PHYSICAL EXAM  Filed Vitals:   08/31/14 1045  BP: 122/70  Pulse: 91  Height: 6\' 2"  (1.88 m)  Weight: 137 lb 3.2 oz (62.234 kg)    Body mass index is 17.61 kg/(m^2).   Visual Acuity Screening   Right eye Left eye Both eyes  Without correction: 20/40 20/200   With correction:       No flowsheet data found.  GENERAL EXAM: Patient is in no distress; well developed, nourished and groomed; neck is supple  CARDIOVASCULAR: Regular rate and rhythm, no murmurs, no carotid bruits  NEUROLOGIC: MENTAL STATUS: awake, alert, oriented to person, place and time, recent and remote memory intact, normal attention and concentration, language fluent, comprehension intact, naming intact, fund of knowledge appropriate; POSITIVE SNOUT; NEG MYERSONS CRANIAL NERVE: no papilledema on fundoscopic exam, pupils equal  and reactive to light, visual fields full to confrontation, extraocular muscles intact, no nystagmus, facial sensation and strength symmetric, hearing intact, palate elevates symmetrically, uvula midline, shoulder shrug symmetric, tongue midline. MOTOR: normal bulk; MILD COGWHEELING IN RUE > LUE; MODERATE BRADYKINESIA IN BUE AND BLE; BUE (4+ PROX, 3+ DISTAL); BLE (4 PROX, 5 DISTAL); LEFT FOOT TAP SLOWER THAN RIGHT SENSORY: normal and symmetric to light touch; DECR VIB AT TOES AND ANKLES; proprioception nl COORDINATION: finger-nose-finger, fine finger movements normal REFLEXES: BUE 2, KNEES 2, ANKLES 0; LEFT TOE UPGOING GAIT/STATION: narrow based gait; STOOPED POSTURE; DIFF RISING; USES WALKER; SLOW AND UNSTEADY, SHORT STEPS, EN BLOC TURNING    DIAGNOSTIC DATA (LABS, IMAGING, TESTING) - I reviewed patient records, labs, notes, testing and imaging myself where available.  Lab Results  Component Value Date   WBC 9.6 10/19/2013   HGB 13.9 10/19/2013   HCT 41.0 10/19/2013  MCV 91.7 10/19/2013   PLT 192 10/19/2013      Component Value Date/Time   NA 138 07/20/2014 1005   NA 139 10/19/2013 1502   K 4.0 07/20/2014 1005   CL 96* 07/20/2014 1005   CO2 22 07/20/2014 1005   GLUCOSE 83 07/20/2014 1005   GLUCOSE 123* 10/19/2013 1502   BUN 20 07/20/2014 1005   BUN 41* 10/19/2013 1502   CREATININE 1.24 07/20/2014 1005   CREATININE 1.23 10/28/2012 1135   CALCIUM 9.9 07/20/2014 1005   PROT 7.0 07/20/2014 1005   PROT 6.4 10/19/2013 1458   ALBUMIN 3.4* 10/19/2013 1458   AST 19 07/20/2014 1005   ALT 13 07/20/2014 1005   ALKPHOS 68 07/20/2014 1005   BILITOT 1.5* 07/20/2014 1005   GFRNONAA 53* 07/20/2014 1005   GFRAA 62 07/20/2014 1005   Lab Results  Component Value Date   CHOL 120 07/20/2014   HDL 52 07/20/2014   LDLCALC 51 07/20/2014   TRIG 85 07/20/2014   CHOLHDL 3.8 10/20/2013   Lab Results  Component Value Date   HGBA1C 5.3 10/20/2013   No results found for: VITAMINB12 No  results found for: TSH  I reviewed images myself and agree with interpretation. -VRP  10/19/13 CT head - Generalized moderate-severe atrophy. Moderate ventriculomegaly; Mild chronic small vessel ischemic disease.  No acute intracranial findings.  10/20/13 carotid u/s - The vertebral arteries appear patent with antegrade flow. Findings consistent with 1-39 percent stenosis involving the right internal carotid artery and the left internal carotid artery.  10/16/13 TTE - Left ventricle: The cavity size was normal. Systolic function was normal. The estimated ejection fraction wasin the range of 55% to 60%. Possible hypokinesis of theapical myocardium. - Aortic valve: Diffuse thickening and calcification. Valve mobility was restricted. There was moderate stenosis.Valve area: 0.68cm^2(VTI). Valve area: 0.79cm^2 (Vmax). - Mitral valve: The MV has heavy MAC with a mobile density that most likely represents a flail chordae tendinae.There is moderate to severe MR Severely calcified annulus.Moderately thickened, moderately calcified leaflets. Valve area by continuity equation (using LVOT flow):1.23cm^2. - Left atrium: The atrium was moderately dilated. - Right ventricle: The cavity size was moderately dilated.Wall thickness was normal.    ASSESSMENT AND PLAN  81 y.o. year old male here with progressive gait decline, lower extremity weakness, back pain, bradykinesia, cogwheel rigidity over past 1 year, especially in last 1-3 months. Has some parkinsonian features. Also with lumbar radiculopathy and neuropathy sxs. Also with significant vascular risk factors. Brain atrophy and gait apraxia raise possibility of underlying neurodegenerative disorder.  Ddx: neurodegenerative (akinetic-rigid parkinsonism), stroke, lumbar radiculopathy, neuropathy, myopathy, deconditioning  PLAN: - CT head, lumbar spine - labs (order given to patient) - PT evaluation - He is a very high fall risk; I advised him not to  drive due to bradykinesia and lower extremity weakness. 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  Orders Placed This Encounter  Procedures  . CT Head Wo Contrast  . CT Lumbar Spine Wo Contrast  . Ambulatory referral to Physical Therapy   Return in about 3 months (around 12/01/2014).    Penni Bombard, MD 10/03/6220, 97:98 PM Certified in Neurology, Neurophysiology and Neuroimaging  [facility] [address] [place], [place] [address] [phone]

## 2014-08-31 NOTE — Patient Instructions (Signed)
I will check additional testing. 

## 2014-09-05 ENCOUNTER — Telehealth: Payer: Self-pay | Admitting: Diagnostic Neuroimaging

## 2014-09-05 ENCOUNTER — Telehealth: Payer: Self-pay | Admitting: Cardiovascular Disease

## 2014-09-05 DIAGNOSIS — Z7901 Long term (current) use of anticoagulants: Secondary | ICD-10-CM

## 2014-09-05 DIAGNOSIS — Z79899 Other long term (current) drug therapy: Secondary | ICD-10-CM

## 2014-09-05 NOTE — Telephone Encounter (Signed)
Norman Lee with [place] office @ [phone], requesting a fax blood work order to [phone] to take to lab [place] stated [facility] is down the street from office and would be more convienent to have BW drawn there.  Please call and advise.

## 2014-09-05 NOTE — Telephone Encounter (Signed)
Patient notified labs were ordered for [facility] and will be faxed as well.

## 2014-09-05 NOTE — Telephone Encounter (Signed)
Pt would like a generic lab order,he wants to ho to [facility] please. Please fax this [phone].

## 2014-09-06 ENCOUNTER — Ambulatory Visit (INDEPENDENT_AMBULATORY_CARE_PROVIDER_SITE_OTHER): Payer: Commercial Managed Care - HMO | Admitting: Cardiovascular Disease

## 2014-09-06 ENCOUNTER — Ambulatory Visit (INDEPENDENT_AMBULATORY_CARE_PROVIDER_SITE_OTHER): Payer: Commercial Managed Care - HMO

## 2014-09-06 ENCOUNTER — Encounter: Payer: Self-pay | Admitting: Cardiovascular Disease

## 2014-09-06 VITALS — BP 130/76 | HR 82 | Resp 16 | Ht 74.0 in | Wt 167.1 lb

## 2014-09-06 DIAGNOSIS — I482 Chronic atrial fibrillation, unspecified: Secondary | ICD-10-CM

## 2014-09-06 DIAGNOSIS — Z950 Presence of cardiac pacemaker: Secondary | ICD-10-CM

## 2014-09-06 DIAGNOSIS — I340 Nonrheumatic mitral (valve) insufficiency: Secondary | ICD-10-CM

## 2014-09-06 DIAGNOSIS — I2583 Coronary atherosclerosis due to lipid rich plaque: Secondary | ICD-10-CM

## 2014-09-06 DIAGNOSIS — I2510 Atherosclerotic heart disease of native coronary artery without angina pectoris: Secondary | ICD-10-CM

## 2014-09-06 LAB — POCT INR: INR: 3

## 2014-09-06 MED ORDER — FUROSEMIDE 40 MG PO TABS: ORAL_TABLET | ORAL | Status: DC

## 2014-09-06 NOTE — Patient Instructions (Signed)
Increase Furosemide to twice a day as needed for swelling.  Remote monitoring is used to monitor your Pacemaker or ICD from home. This monitoring reduces the number of office visits required to check your device to one time per year. It allows [place] to monitor the functioning of your device to ensure it is working properly. You are scheduled for a device check from home on December 08, 2014. You may send your transmission at any time that day. If you have a wireless device, the transmission will be sent automatically. After your physician reviews your transmission, you will receive a postcard with your next transmission date.  Dr. Sallyanne Kuster recommends that you schedule a follow-up appointment in: One year with pacemaker check.

## 2014-09-06 NOTE — Progress Notes (Signed)
Patient ID: Norman Lee, male   DOB: February 15, 1929, 81 y.o.   MRN: [id number]      Cardiology Office Note   Date:  09/06/2014   ID:  Norman Lee, Norman Lee [birth year], MRN [id number]  PCP:  Cletis Athens, MD  Cardiologist:  Shelva Majestic, MD;  Sanda Klein, MD   Chief Complaint  Patient presents with  . Follow-up    dyspnea on occasion      History of Present Illness: Norman Lee is a 81 y.o. male who presents for pacemaker follow-up. He has longstanding atrial fibrillation with slow ventricular response and a Medtronic pacemaker. He also has MVP with bileaflet prolapse, a flail segment of the posterior leaflet and severe mitral insufficiency. He has declined surgical repair in the past, but has recently developed CHF and requires diuretic therapy. He had a remote left circumflex artery infarction.  For the first time today he mentioned mitral valve surgery. He asked if I think it is too late to have surgery.  Pacemaker interrogation shows normal device function. There is >99% V pacing, very rare episodes of high ventricular rate, almost all convincingly due to brief NSVT (longest 10 seconds), less likely conducted AF with RVR.Marland Kitchen  He has lost weight and has worsening lumbar spine disease related pain. He is intermittently using a walker. He still works 3 days a week.  Past Medical History  Diagnosis Date  . Prostate cancer   . CAD (coronary artery disease)     Circumflex MI 1995, Last cath 2008  . Mitral regurgitation     Severe  . HLD (hyperlipidemia)   . HTN (hypertension)   . Atrial fibrillation   . Pulmonary hypertension   . Presence of permanent cardiac pacemaker 10/16/2006,09/04/12    Medtronic adapta  . Chronic anticoagulation   . Dysrhythmia     ATRIAL FIBRILATION  . Pacemaker     Past Surgical History  Procedure Laterality Date  . Permanent pacemaker insertion  10/16/2006    Medtronic Enrhythm  . Permanent pacemaker generator change  09/04/2012   Medtronic adapta  . Coronary angioplasty  09/16/1993    100% CX-successful  . Cardiac catheterization  05/11/2002    70-80% small first diagonal,50% RCA  . Cardiac catheterization  10/14/2006    multivessell,EF 40-50%  . Nm myoview ltd  08/30/2009    mod. perfusion defect-mid inferior,apical inferior,basal inferolateral,mid inferolateral & apical lateral regions  . US echocardiography  03/27/2012    severe MR persists w/progressive LA dilatation,RSVP has increased & AS has progressed  . Permanent pacemaker generator change N/A 09/04/2012    Procedure: PERMANENT PACEMAKER GENERATOR CHANGE;  Surgeon: Sanda Klein, MD;  Location: [facility];  Service: Cardiovascular;  Laterality: N/A;  . Cholecystectomy       Current Outpatient Prescriptions  Medication Sig Dispense Refill  . aspirin 81 MG tablet Take 81 mg by mouth daily.    . furosemide (LASIX) 40 MG tablet Take 1 tablet twice a day as needed for swelling. 100 tablet 3  . metoprolol succinate (TOPROL XL) 25 MG 24 hr tablet Take 1/2 tablet daily 45 tablet 3  . ramipril (ALTACE) 10 MG capsule TAKE 1 CAPSULE (10 MG TOTAL) BY MOUTH DAILY. 90 capsule 2  . simvastatin (ZOCOR) 20 MG tablet TAKE 1 TABLET (20 MG TOTAL) BY MOUTH AT BEDTIME. 90 tablet 3  . warfarin (COUMADIN) 5 MG tablet TAKE 1 TABLET BY MOUTH DAILY OR AS DIRECTED (Patient taking differently: TAKE 1 TABLET BY MOUTH DAILY OR AS  DIRECTED. 2.5mg  5 days a week and then 5mg  other two days (sat, sun)) 90 tablet 1   No current facility-administered medications for this visit.    Allergies:   Codeine    Social History:  The patient  reports that he has never smoked. He has never used smokeless tobacco. He reports that he does not drink alcohol or use illicit drugs.   Family History:  The patient's family history includes Diabetes in his sister.    ROS:  Please see the history of present illness.    Otherwise, review of systems positive for low back pain and weak legs, class II  exertional dyspnea, worsening pedal edema.   The patient specifically denies any chest pain at rest or with exertion, dyspnea at rest, orthopnea, paroxysmal nocturnal dyspnea, syncope, palpitations, focal neurological deficits, intermittent claudication, unexplained weight gain, cough, hemoptysis or wheezing.  The patient also denies abdominal pain, nausea, vomiting, dysphagia, diarrhea, constipation, polyuria, polydipsia, dysuria, hematuria, frequency, urgency, abnormal bleeding or bruising, fever, chills, unexpected weight changes, mood swings, change in skin or hair texture, change in voice quality, auditory or visual problems, allergic reactions or rashes.  All other systems are reviewed and negative.    PHYSICAL EXAM: VS:  BP 130/76 mmHg  Pulse 82  Ht 6\' 2"  (1.88 m)  Wt 167 lb 1.6 oz (75.796 kg)  BMI 21.45 kg/m2 , BMI Body mass index is 21.45 kg/(m^2).  General: Alert, oriented x3, no distress Head: no evidence of trauma, PERRL, EOMI, no exophtalmos or lid lag, no myxedema, no xanthelasma; normal ears, nose and oropharynx Neck: 7-8 cm elevation in jugular venous pulsations and prompt hepatojugular reflux; brisk carotid pulses without delay and no carotid bruits Chest: clear to auscultation, no signs of consolidation by percussion or palpation, normal fremitus, symmetrical and full respiratory excursions Cardiovascular: normal position and quality of the apical impulse, regular rhythm, normal first and second heart sounds, +ve S3 gallop, 3/6 holosystolic late peaking very musical murmur heard at the apex radiating both to the axilla and the base of the precordium Abdomen: no tenderness or distention, no masses by palpation, no abnormal pulsatility or arterial bruits, normal bowel sounds, no hepatosplenomegaly Extremities: no clubbing, cyanosis; 1-2+ ankle edema; 2+ radial, ulnar and brachial pulses bilaterally; 2+ right femoral, posterior tibial and dorsalis pedis pulses; 2+ left femoral,  posterior tibial and dorsalis pedis pulses; no subclavian or femoral bruits Neurological: grossly nonfocal Psych: euthymic mood, full affect   EKG:  EKG is not ordered today. Labs: 10/17/2013: Pro B Natriuretic peptide (BNP) 7399.0* 10/19/2013: Hemoglobin 13.9; Platelets 192 07/20/2014: ALT 13; BUN 20; Creatinine 1.24; Potassium 4.0; Sodium 138    Lipid Panel    Component Value Date/Time   CHOL 120 07/20/2014 1005   CHOL 106 10/20/2013 0517   TRIG 85 07/20/2014 1005   HDL 52 07/20/2014 1005   HDL 28* 10/20/2013 0517   CHOLHDL 3.8 10/20/2013 0517   VLDL 18 10/20/2013 0517   LDLCALC 51 07/20/2014 1005   LDLCALC 60 10/20/2013 0517      Wt Readings from Last 3 Encounters:  09/06/14 167 lb 1.6 oz (75.796 kg)  08/31/14 137 lb 3.2 oz (62.234 kg)  08/09/14 171 lb 4.8 oz (77.701 kg)      Other studies Reviewed: Additional studies/ records that were reviewed today include: comprehensive pacemaker check. Review of the above records demonstrates: 12 year generator longevity, 99.3% V paced, normal lead parameters. Brief episodes of NSVT.   ASSESSMENT AND PLAN:  1.  Normal pacemaker function - Carelink Q3 months and yearly in-office check  2.  Atrial fibrillation with slow response - on warfarin, INR 3.1 today.   3.  CHF due to severe MR - he seems to have second thoughts about his decision not to have surgical repair and asks "if it is too late now". I told him I would discuss that with Dr. Claiborne Billings and we could begin the necessary workup, starting with an updated echo. He can temporarily double furosemide as needed for edema.  4.  NSVT - brief and asymptomatic  5. CAD - no events in a long time, angina free   Current medicines are reviewed at length with the patient today.  The patient does not have concerns regarding medicines.  The following changes have been made:  Increase furosemide to twice daily prn excessive edema or exertional dyspnea.  Labs/ tests ordered today include:    No orders of the defined types were placed in this encounter.   Patient Instructions  Increase Furosemide to twice a day as needed for swelling.  Remote monitoring is used to monitor your Pacemaker or ICD from home. This monitoring reduces the number of office visits required to check your device to one time per year. It allows [place] to monitor the functioning of your device to ensure it is working properly. You are scheduled for a device check from home on December 08, 2014. You may send your transmission at any time that day. If you have a wireless device, the transmission will be sent automatically. After your physician reviews your transmission, you will receive a postcard with your next transmission date.  Dr. Sallyanne Kuster recommends that you schedule a follow-up appointment in: One year with pacemaker check.       Mikael Spray, MD  09/06/2014 5:33 PM    Sanda Klein, MD, [facility] [phone] office [phone] pager

## 2014-09-07 LAB — MDC_IDC_ENUM_SESS_TYPE_INCLINIC
Battery Impedance: 108 Ohm
Battery Voltage: 2.78 V
Lead Channel Impedance Value: 420 Ohm
Lead Channel Pacing Threshold Amplitude: 0.875 V
Lead Channel Pacing Threshold Pulse Width: 0.4 ms
Lead Channel Setting Pacing Amplitude: 2 V
Lead Channel Setting Pacing Pulse Width: 0.4 ms
Lead Channel Setting Sensing Sensitivity: 2 mV
MDC IDC MSMT BATTERY REMAINING LONGEVITY: 153 mo
MDC IDC MSMT LEADCHNL RA IMPEDANCE VALUE: 67 Ohm
MDC IDC SESS DTM: 20160322200524
MDC IDC STAT BRADY RV PERCENT PACED: 99 %

## 2014-09-21 ENCOUNTER — Ambulatory Visit: Payer: Commercial Managed Care - HMO | Admitting: Cardiovascular Disease

## 2014-09-21 ENCOUNTER — Other Ambulatory Visit: Payer: Commercial Managed Care - HMO

## 2014-09-22 ENCOUNTER — Telehealth: Payer: Self-pay | Admitting: Diagnostic Neuroimaging

## 2014-09-22 NOTE — Telephone Encounter (Signed)
Called and left a message for the pt, letting him know that I had received his message and that the scheduler for the MRI/CT was going to contact [facility] and get them to call him about the dates and times. I told him if he needed anything further to call back and let us know.

## 2014-09-22 NOTE — Telephone Encounter (Signed)
Pt is calling stating he needs to have his CT scans done at [facility] closer to where he lives.  Please call and advise.

## 2014-09-30 ENCOUNTER — Encounter: Payer: Self-pay | Admitting: Cardiovascular Disease

## 2014-10-05 ENCOUNTER — Ambulatory Visit
Admit: 2014-10-05 | Disposition: A | Discharge status: Home / Self Care | Payer: Self-pay | Attending: Diagnostic Neuroimaging | Admitting: Diagnostic Neuroimaging

## 2014-10-06 ENCOUNTER — Telehealth: Payer: Self-pay | Admitting: Diagnostic Neuroimaging

## 2014-10-06 NOTE — Telephone Encounter (Signed)
Verdie Drown, office manager from Dr. Ju[facility] called wanting to follow up and see if his CT scan results were in. Please call and advice # [phone]

## 2014-10-07 NOTE — Op Note (Signed)
PATIENT NAME:  Norman Lee, Norman Lee MR#:  [id number] DATE OF BIRTH:  [birth year]  DATE OF PROCEDURE:  11/18/2012  PROCEDURES PERFORMED: 1.  Pars plana vitrectomy of the right eye.  2.  Gas exchange of the right eye.  3.  Endolaser of the right eye.   PREOPERATIVE DIAGNOSIS: Rhegmatogenous retinal detachment.   POSTOPERATIVE DIAGNOSIS: Rhegmatogenous retinal detachment  ESTIMATED BLOOD LOSS: Less than 1 mL.   PRIMARY SURGEON: Garlan Fair, M.D.   ANESTHESIA: Transconjunctival retrobulbar block of the right eye with monitored anesthesia care.   COMPLICATIONS: None.   INDICATIONS FOR PROCEDURE: This patient presented to my office with a curtain of the right eye. Examination revealed a macula on rhegmatogenous retinal detachment approaching superior temporal arcade. The risks, benefits, and alternatives of the above procedure were discussed and the patient wished to proceed.   DETAILS OF PROCEDURE: After formal informed consent was obtained, the patient was brought to the operative suite at [facility]. The patient was placed in supine position, was given fentanyl, Versed and tetracaine on the eye. An inferonasal transconjunctival sub-Tenon wound was created. A retrobulbar block was given via this tract, secondary to the patient being an emergent case with Coumadin on board. A few minutes was allowed to elapse for anesthesia and akinesia. A 25-gauge trocar was placed inferotemporally through displaced conjunctiva in an oblique fashion 3 mm beyond the limbus. The infusion cannula was turned on and inserted through the trocar and secured in position with Steri-Strips. Two more trocars were placed in a similar fashion superotemporally and superonasally. The vitreous cutter and light pipe were introduced into the eye and a core vitrectomy was performed. Peripheral vitreous was trimmed for 360 degrees out to the ora serrata. A single retinal tear was identified in the anterior  portion of the retinal detachment at approximately 10:30. An Endo cautery was introduced and the tear was marked. Posterior draining retinotomy was created just posterior to the retinal break. An air-fluid exchange was performed through the posterior draining retinotomy and the retina completely flattened. Four rows of laser was placed around the posterior draining retinotomy and around the retinal tear. Endolaser was carried from in between the equator and ora serrata out to the ora serrata going from one end of the retinal detachment to the other (from 12 o'clock counterclockwise to 9:30). Once this was completed, any remnant fluid was removed. 22% SF6 was used as an air gas exchange and the trocars were removed. The wounds were noted to be airtight and pressure in the eye was confirmed to be approximately 15 mmHg, 5 mg of dexamethasone was given into the inferior fornix and the lid speculum was removed. The eye was cleaned and TobraDex was placed in the eye. A patch and shield were placed over the eye. The patient was taken to postanesthesia care with instructions to remain head up with a left tilt.     ____________________________ Teresa Pelton. Starling Manns, MD mfa:cc D: 11/18/2012 20:22:02 ET T: 11/18/2012 21:08:22 ET JOB#: [id number]  cc: Teresa Pelton. Starling Manns, MD, <Dictator> Coralee Rud MD ELECTRONICALLY SIGNED 12/23/2012 7:17

## 2014-10-07 NOTE — Telephone Encounter (Signed)
Spoke to the office manager Verdie Drown and informed her that Dr. Leta Baptist was out of the office until Tuesday. I told her that it normally takes a few days before doctors call with results. She stated an understanding and a thanks and I told her that I would get Dr. Leta Baptist to get back to the pt next week.

## 2014-10-11 ENCOUNTER — Telehealth: Payer: Self-pay | Admitting: Diagnostic Neuroimaging

## 2014-10-11 NOTE — Telephone Encounter (Signed)
I called Dr. Hardin Negus. Review CT head and l-spine results. Advised PT evaluation and conservative mgmt. He will try this and come back in June 2016 for follow up. -VRP

## 2014-10-11 NOTE — Telephone Encounter (Signed)
Pt is calling to get CT results.  Please call and advise.

## 2014-10-26 ENCOUNTER — Other Ambulatory Visit: Payer: Self-pay | Admitting: Diagnostic Neuroimaging

## 2014-10-27 LAB — BASIC METABOLIC PANEL
BUN/Creatinine Ratio: 16 (ref 10–22)
BUN: 20 mg/dL (ref 8–27)
CALCIUM: 9.7 mg/dL (ref 8.6–10.2)
CO2: 24 mmol/L (ref 18–29)
Chloride: 100 mmol/L (ref 97–108)
Creatinine, Ser: 1.29 mg/dL — ABNORMAL HIGH (ref 0.76–1.27)
GFR calc non Af Amer: 51 mL/min/{1.73_m2} — ABNORMAL LOW (ref >59)
GFR, EST AFRICAN AMERICAN: 59 mL/min/{1.73_m2} — AB (ref >59)
Glucose: 100 mg/dL — ABNORMAL HIGH (ref 65–99)
Potassium: 4.4 mmol/L (ref 3.5–5.2)
SODIUM: 141 mmol/L (ref 134–144)

## 2014-10-27 LAB — CK: Total CK: 39 U/L (ref 24–204)

## 2014-10-27 LAB — VITAMIN B12: VITAMIN B 12: 420 pg/mL (ref 211–946)

## 2014-10-27 LAB — ALDOLASE: Aldolase: 8.2 U/L (ref 3.3–10.3)

## 2014-10-27 LAB — PROTIME-INR
INR: 2.3 — ABNORMAL HIGH (ref 0.8–1.2)
PROTHROMBIN TIME: 24.4 s — AB (ref 9.1–12.0)

## 2014-10-29 ENCOUNTER — Other Ambulatory Visit: Payer: Self-pay | Admitting: Cardiovascular Disease

## 2014-10-31 ENCOUNTER — Other Ambulatory Visit: Payer: Self-pay | Admitting: Cardiovascular Disease

## 2014-11-02 ENCOUNTER — Telehealth: Payer: Self-pay | Admitting: *Deleted

## 2014-11-02 NOTE — Telephone Encounter (Signed)
-----   Message from Troy Sine, MD sent at 11/01/2014 11:52 AM EDT ----- Labs ok; Cr better; INR therapeutic

## 2014-11-02 NOTE — Telephone Encounter (Signed)
Left lab results on cell phone. Call back if questions.

## 2014-12-07 ENCOUNTER — Encounter: Payer: Commercial Managed Care - HMO | Admitting: *Deleted

## 2014-12-07 ENCOUNTER — Telehealth: Payer: Self-pay | Admitting: Cardiology

## 2014-12-07 ENCOUNTER — Ambulatory Visit: Payer: Self-pay | Admitting: Diagnostic Neuroimaging

## 2014-12-07 NOTE — Telephone Encounter (Signed)
Attempted to confirm remote transmission with pt. No answer and was unable to leave a message.   

## 2014-12-08 ENCOUNTER — Encounter: Payer: Self-pay | Admitting: Cardiology

## 2014-12-08 ENCOUNTER — Encounter: Payer: Self-pay | Admitting: Diagnostic Neuroimaging

## 2015-01-26 ENCOUNTER — Telehealth: Payer: Self-pay | Admitting: Cardiovascular Disease

## 2015-01-27 NOTE — Telephone Encounter (Signed)
Close encounter 

## 2015-01-30 ENCOUNTER — Ambulatory Visit (INDEPENDENT_AMBULATORY_CARE_PROVIDER_SITE_OTHER): Payer: Commercial Managed Care - HMO | Admitting: Cardiovascular Disease

## 2015-01-30 ENCOUNTER — Encounter: Payer: Self-pay | Admitting: Cardiovascular Disease

## 2015-01-30 VITALS — BP 125/77 | HR 73 | Resp 24 | Ht 74.0 in

## 2015-01-30 DIAGNOSIS — R310 Gross hematuria: Secondary | ICD-10-CM

## 2015-01-30 DIAGNOSIS — I10 Essential (primary) hypertension: Secondary | ICD-10-CM

## 2015-01-30 DIAGNOSIS — I270 Primary pulmonary hypertension: Secondary | ICD-10-CM | POA: Diagnosis not present

## 2015-01-30 DIAGNOSIS — Z79899 Other long term (current) drug therapy: Secondary | ICD-10-CM

## 2015-01-30 DIAGNOSIS — E785 Hyperlipidemia, unspecified: Secondary | ICD-10-CM

## 2015-01-30 DIAGNOSIS — I2510 Atherosclerotic heart disease of native coronary artery without angina pectoris: Secondary | ICD-10-CM

## 2015-01-30 DIAGNOSIS — I272 Pulmonary hypertension, unspecified: Secondary | ICD-10-CM

## 2015-01-30 DIAGNOSIS — I340 Nonrheumatic mitral (valve) insufficiency: Secondary | ICD-10-CM

## 2015-01-30 DIAGNOSIS — I482 Chronic atrial fibrillation, unspecified: Secondary | ICD-10-CM

## 2015-01-30 DIAGNOSIS — Z950 Presence of cardiac pacemaker: Secondary | ICD-10-CM

## 2015-01-30 DIAGNOSIS — Z7901 Long term (current) use of anticoagulants: Secondary | ICD-10-CM

## 2015-01-30 MED ORDER — METOLAZONE 2.5 MG PO TABS: ORAL_TABLET | ORAL | Status: DC

## 2015-01-30 NOTE — Patient Instructions (Signed)
Medication Instructions:   START metolazone 2.5mg  Monday, Wednesday, Friday   >> take 30 minutes before lasix in the morning  Labwork:  CMET, CBC, PT/PTT in 1 week  Testing/Procedures:  Your physician has requested that you have an echocardiogram ([address]). Echocardiography is a painless test that uses sound waves to create images of your heart. It provides your doctor with information about the size and shape of your heart and how well your heart's chambers and valves are working. This procedure takes approximately one hour. There are no restrictions for this procedure.   Follow-Up:  3 months with Dr. Sallyanne Kuster with device check     Any Other Special Instructions Will Be Listed Below (If Applicable).  Please monitor your weights daily. Please weigh at the same time each day in the same amount of clothes in the morning

## 2015-01-30 NOTE — Progress Notes (Signed)
Patient ID: Norman Lee, male   DOB: October 06, 1928, 81 y.o.   MRN: [id number]     Cardiology Office Note   Date:  01/30/2015   ID:  Norman Lee, DOB [birth year], MRN [id number]  PCP:  Cletis Athens, MD  Cardiologist:   Sanda Klein, MD   Chief Complaint  Patient presents with  . Shortness of Breath    Patient has felt light headed, dizzy, chest pain, and swelling in his feet and legs.      History of Present Illness: Norman Lee is a 81 y.o. male who presents for  Severe mitral insufficiency.   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 He has also developed severe bilateral lower extremity edema up to the knees. He stopped taking the metoprolol since "he didn't think he needed it" and since his blood pressure was sometimes low. Furosemide had been prescribed twice daily, but he only takes it daily since he does not want to spend all day urinating. He has not had any bleeding problems while on anticoagulation with warfarin and has not had any focal neurological events to suggest stroke or TIA since May 2015 ( syncopal event followed by right-sided weakness and aphasia you when INR was 1.96).    He continues to have difficulty walking and attributes this to sensory neuropathy. He has been using a walker. Today he is in a wheelchair but he does not typically do this. He sees Dr. Leta Baptist who considered him to be a very high fall risk and has advised him not to drive.  His wife reports that Dr. Hardin Negus often will "slip out of his chair or the sofa ".   pacemaker interrogation shows normal device function. His dual-chamber Medtronic Adapta device is programmed VVIR for permanent atrial fibrillation and shows 99.2% ventricular pacing. There are very rare episodes of high ventricular rates, never more than 10 seconds in  duration. Most of them are clearly irregular consistent with atrial fibrillation with rapid ventricular response, one of them most likely represents nonsustained ventricular tachycardia with a regular cycle length around 343 ms. Estimated generator longevity his 10-13 years.    Past Medical History  Diagnosis Date  . Prostate cancer   . CAD (coronary artery disease)     Circumflex MI 1995, Last cath 2008  . Mitral regurgitation     Severe  . HLD (hyperlipidemia)   . HTN (hypertension)   . Atrial fibrillation   . Pulmonary hypertension   . Presence of permanent cardiac pacemaker 10/16/2006,09/04/12    Medtronic adapta  . Chronic anticoagulation   . Dysrhythmia     ATRIAL FIBRILATION  . Pacemaker     Past Surgical History  Procedure Laterality Date  . Permanent pacemaker insertion  10/16/2006    Medtronic Enrhythm  . Permanent pacemaker generator change  09/04/2012    Medtronic adapta  . Coronary angioplasty  09/16/1993    100% CX-successful  . Cardiac catheterization  05/11/2002    70-80% small first diagonal,50% RCA  . Cardiac catheterization  10/14/2006    multivessell,EF 40-50%  . Nm myoview ltd  08/30/2009    mod. perfusion defect-mid inferior,apical inferior,basal inferolateral,mid inferolateral & apical lateral regions  . US echocardiography  03/27/2012    severe MR persists w/progressive LA dilatation,RSVP has increased & AS has progressed  . Permanent pacemaker generator change N/A  09/04/2012    Procedure: PERMANENT PACEMAKER GENERATOR CHANGE;  Surgeon: Sanda Klein, MD;  Location: [place] [facility];  Service: Cardiovascular;  Laterality: N/A;  . Cholecystectomy       Current Outpatient Prescriptions  Medication Sig Dispense Refill  . aspirin 81 MG tablet Take 81 mg by mouth daily.    . furosemide (LASIX) 40 MG tablet Take 1 tablet twice a day as needed for swelling. 100 tablet 3  . ramipril (ALTACE) 10 MG capsule TAKE 1 CAPSULE (10 MG TOTAL) BY MOUTH DAILY. 90 capsule 1  .  simvastatin (ZOCOR) 20 MG tablet TAKE 1 TABLET (20 MG TOTAL) BY MOUTH AT BEDTIME. 90 tablet 3  . warfarin (COUMADIN) 5 MG tablet TAKE 1 TABLET BY MOUTH DAILY OR AS DIRECTED (Patient taking differently: TAKE 1 TABLET BY MOUTH DAILY OR AS DIRECTED. 2.5mg  5 days a week and then 5mg  other two days (sat, sun)) 90 tablet 1  . metolazone (ZAROXOLYN) 2.5 MG tablet Take 1 tablet by mouth 3 times a week on Monday, Wednesday, Friday 30 tablet 3   No current facility-administered medications for this visit.    Allergies:   Codeine    Social History:  The patient  reports that he has never smoked. He has never used smokeless tobacco. He reports that he does not drink alcohol or use illicit drugs.   Family History:  The patient's family history includes Diabetes in his sister.    ROS:  Please see the history of present illness.    Otherwise, review of systems positive for  Weakness, fatigue, poor balance, falls, lower extremity edema, exertional dyspnea.   All other systems are reviewed and negative.    PHYSICAL EXAM: VS:  BP 125/77 mmHg  Pulse 73  Resp 24  Ht 6\' 2"  (1.88 m)  Wt  , BMI There is no weight on file to calculate BMI.  General: Alert, oriented x3, no distress Head: no evidence of trauma, PERRL, EOMI, no exophtalmos or lid lag, no myxedema, no xanthelasma; normal ears, nose and oropharynx Neck: normal jugular venous pulsations and no hepatojugular reflux; brisk carotid pulses without delay and no carotid bruits Chest: clear to auscultation, no signs of consolidation by percussion or palpation, normal fremitus, symmetrical and full respiratory excursions Cardiovascular: normal position and quality of the apical impulse, regular rhythm, normal first and paradoxically split second heart sounds, grade 3/6 holosystolic apical murmur radiating deep towards the axilla as well as towards the base of the heart, faint diastolic rumble at the apex, possible third heart sound (heart to distinguish  from split second heart sound).  Abdomen: no tenderness or distention, no masses by palpation, no abnormal pulsatility or arterial bruits, normal bowel sounds, no hepatosplenomegaly Extremities: no clubbing, cyanosis;  Symmetrical 3+ deep pitting bilateral edema to the knees; 2+ radial, ulnar and brachial pulses bilaterally; 2+ right femoral, posterior tibial and dorsalis pedis pulses; 2+ left femoral, posterior tibial and dorsalis pedis pulses; no subclavian or femoral bruits Neurological: grossly nonfocal Psych: euthymic mood, full affect   EKG:  EKG is ordered today. The ekg ordered today demonstrates  Atrial fibrillation 100% ventricular pacing   Recent Labs: 07/20/2014: ALT 13 10/26/2014: BUN 20; Creatinine, Ser 1.29*; Potassium 4.4; Sodium 141    Lipid Panel    Component Value Date/Time   CHOL 120 07/20/2014 1005   CHOL 106 10/20/2013 0517   TRIG 85 07/20/2014 1005   HDL 52 07/20/2014 1005   HDL 28* 10/20/2013 0517   CHOLHDL 3.8 10/20/2013  0517   VLDL 18 10/20/2013 0517   LDLCALC 51 07/20/2014 1005   LDLCALC 60 10/20/2013 0517      Wt Readings from Last 3 Encounters:  09/06/14 167 lb 1.6 oz (75.796 kg)  08/31/14 137 lb 3.2 oz (62.234 kg)  08/09/14 171 lb 4.8 oz (77.701 kg)    ASSESSMENT AND PLAN:  1.  Permanent atrial fibrillation with slow ventricular response on warfarin anticoagulation 2.  Normally functioning dual-chamber permanent pacemaker, programmed a single chamber device 3.  Asymptomatic nonsustained ventricular tachycardia, brief and infrequent 4.  Acute on chronic combined systolic and diastolic heart failure due to combination of valvular heart disease and ischemic cardiomyopathy.  Most recent EF 55-60% in 2015, but 40-50% in the past 5.  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  Coronary artery disease status post remote posterior wall myocardial infarction in 1995,  His recent coronary angiogram  2008  Showed stenosis in a small first diagonal artery and 50% stenosis in the right coronary artery 7. Aortic stenosis, at least moderate 8. Neuropathy, gait disorder   Dr. Hardin Negus has now developed overt congestive heart failure with evidence of marked hypervolemia by physical exam , most likely due to progression of cardiomyopathy in the setting of severe mitral insufficiency. In the past he has declined evaluation with TEE since he did not want to undergo valve surgery. He is now thinking that this may be a necessary intervention. Review the fact that we may have "missed the boat" and that he might even worsen following mitral valve repair if there has been substantial interval LV lead dilatation or LV dysfunction. The first step would be to get an echocardiogram and assess left ventricular EF. Secondly he would need transesophageal echo to confirm the presumed anatomy of flail P2 segment of the posterior mitral leaflet , thirdly he would need right and left heart catheterization.   Even if all the cardiac evaluation suggest that he would benefit from mitral valve repair , the question remains whether or not he is still functional enough to undergo a big surgery and difficult rehabilitation. He is much more frail , unsteady on his feet and prone to falls. If he is felt not to be a surgical candidate, consider referral for mitral clip. Will discuss with Dr. Ellouise Newer.   In the meantime he needs additional diuresis. Since he does not want the twice daily furosemide regimen, will add metolazone 2.5 mg daily, 3 days a week to be taken 30 minutes before his furosemide. I would not restart his beta blocker until he is closer to euvolemia.  Current medicines are reviewed at length with the patient today.  The patient has concerns regarding medicines.   Labs/ tests ordered today include:  Orders Placed This Encounter  Procedures  . CBC  . Comprehensive metabolic panel  . APTT  . INR/PT  . EKG  12-Lead  . ECHOCARDIOGRAM COMPLETE    Patient Instructions  Medication Instructions:   START metolazone 2.5mg  Monday, Wednesday, Friday   >> take 30 minutes before lasix in the morning  Labwork:  CMET, CBC, PT/PTT in 1 week  Testing/Procedures:  Your physician has requested that you have an echocardiogram ([address]). Echocardiography is a painless test that uses sound waves to create images of your heart. It provides your doctor with information about the size and shape of your heart and how well your heart's chambers and valves are working. This  procedure takes approximately one hour. There are no restrictions for this procedure.   Follow-Up:  3 months with Dr. Sallyanne Kuster with device check     Any Other Special Instructions Will Be Listed Below (If Applicable).  Please monitor your weights daily. Please weigh at the same time each day in the same amount of clothes in the morning     Signed, Sanda Klein, MD  01/30/2015 4:06 PM    Sanda Klein, MD, [facility] [phone] office [phone] pager

## 2015-02-03 LAB — CUP PACEART INCLINIC DEVICE CHECK
Battery Impedance: 156 Ohm
Battery Remaining Longevity: 141 mo
Brady Statistic RV Percent Paced: 99 %
Lead Channel Impedance Value: 441 Ohm
Lead Channel Impedance Value: 67 Ohm
Lead Channel Setting Pacing Pulse Width: 0.4 ms
MDC IDC MSMT BATTERY VOLTAGE: 2.78 V
MDC IDC MSMT LEADCHNL RV PACING THRESHOLD AMPLITUDE: 0.75 V
MDC IDC MSMT LEADCHNL RV PACING THRESHOLD PULSEWIDTH: 0.4 ms
MDC IDC SESS DTM: 20160815170934
MDC IDC SET LEADCHNL RV PACING AMPLITUDE: 2 V
MDC IDC SET LEADCHNL RV SENSING SENSITIVITY: 2 mV

## 2015-02-15 ENCOUNTER — Other Ambulatory Visit (HOSPITAL_COMMUNITY): Payer: Commercial Managed Care - HMO

## 2015-02-25 ENCOUNTER — Other Ambulatory Visit: Payer: Self-pay | Admitting: Cardiovascular Disease

## 2015-02-25 ENCOUNTER — Encounter (HOSPITAL_COMMUNITY): Payer: Self-pay

## 2015-02-25 ENCOUNTER — Emergency Department (HOSPITAL_COMMUNITY)
Admission: EM | Admit: 2015-02-25 | Discharge: 2015-02-25 | Disposition: A | Discharge status: Home / Self Care | Payer: Commercial Managed Care - HMO | Attending: Emergency Medicine | Admitting: Emergency Medicine

## 2015-02-25 DIAGNOSIS — I252 Old myocardial infarction: Secondary | ICD-10-CM | POA: Diagnosis not present

## 2015-02-25 DIAGNOSIS — Z7901 Long term (current) use of anticoagulants: Secondary | ICD-10-CM | POA: Insufficient documentation

## 2015-02-25 DIAGNOSIS — M7989 Other specified soft tissue disorders: Secondary | ICD-10-CM | POA: Insufficient documentation

## 2015-02-25 DIAGNOSIS — R011 Cardiac murmur, unspecified: Secondary | ICD-10-CM | POA: Insufficient documentation

## 2015-02-25 DIAGNOSIS — Z8546 Personal history of malignant neoplasm of prostate: Secondary | ICD-10-CM | POA: Insufficient documentation

## 2015-02-25 DIAGNOSIS — Z950 Presence of cardiac pacemaker: Secondary | ICD-10-CM | POA: Diagnosis not present

## 2015-02-25 DIAGNOSIS — R17 Unspecified jaundice: Secondary | ICD-10-CM

## 2015-02-25 DIAGNOSIS — I2510 Atherosclerotic heart disease of native coronary artery without angina pectoris: Secondary | ICD-10-CM | POA: Diagnosis not present

## 2015-02-25 DIAGNOSIS — N390 Urinary tract infection, site not specified: Secondary | ICD-10-CM

## 2015-02-25 DIAGNOSIS — R339 Retention of urine, unspecified: Secondary | ICD-10-CM | POA: Insufficient documentation

## 2015-02-25 DIAGNOSIS — Z9889 Other specified postprocedural states: Secondary | ICD-10-CM | POA: Diagnosis not present

## 2015-02-25 DIAGNOSIS — Z7982 Long term (current) use of aspirin: Secondary | ICD-10-CM | POA: Insufficient documentation

## 2015-02-25 DIAGNOSIS — I4891 Unspecified atrial fibrillation: Secondary | ICD-10-CM | POA: Insufficient documentation

## 2015-02-25 DIAGNOSIS — Z9861 Coronary angioplasty status: Secondary | ICD-10-CM | POA: Diagnosis not present

## 2015-02-25 DIAGNOSIS — E785 Hyperlipidemia, unspecified: Secondary | ICD-10-CM | POA: Diagnosis not present

## 2015-02-25 DIAGNOSIS — Z79899 Other long term (current) drug therapy: Secondary | ICD-10-CM | POA: Diagnosis not present

## 2015-02-25 DIAGNOSIS — R319 Hematuria, unspecified: Secondary | ICD-10-CM

## 2015-02-25 LAB — CBC WITH DIFFERENTIAL/PLATELET
Basophils Absolute: 0 10*3/uL (ref 0.0–0.1)
Basophils Relative: 0 % (ref 0–1)
Eosinophils Absolute: 0 10*3/uL (ref 0.0–0.7)
Eosinophils Relative: 0 % (ref 0–5)
HCT: 47.1 % (ref 39.0–52.0)
HEMOGLOBIN: 16.1 g/dL (ref 13.0–17.0)
LYMPHS ABS: 1.3 10*3/uL (ref 0.7–4.0)
LYMPHS PCT: 13 % (ref 12–46)
MCH: 31.3 pg (ref 26.0–34.0)
MCHC: 34.2 g/dL (ref 30.0–36.0)
MCV: 91.5 fL (ref 78.0–100.0)
Monocytes Absolute: 1 10*3/uL (ref 0.1–1.0)
Monocytes Relative: 10 % (ref 3–12)
NEUTROS PCT: 77 % (ref 43–77)
Neutro Abs: 7.6 10*3/uL (ref 1.7–7.7)
Platelets: 198 10*3/uL (ref 150–400)
RBC: 5.15 MIL/uL (ref 4.22–5.81)
RDW: 14.8 % (ref 11.5–15.5)
WBC: 10 10*3/uL (ref 4.0–10.5)

## 2015-02-25 LAB — URINALYSIS, ROUTINE W REFLEX MICROSCOPIC
Glucose, UA: NEGATIVE mg/dL
Ketones, ur: 15 mg/dL — AB
NITRITE: POSITIVE — AB
PH: 5 (ref 5.0–8.0)
Protein, ur: NEGATIVE mg/dL
SPECIFIC GRAVITY, URINE: 1.014 (ref 1.005–1.030)
Urobilinogen, UA: 1 mg/dL (ref 0.0–1.0)

## 2015-02-25 LAB — BASIC METABOLIC PANEL
Anion gap: 11 (ref 5–15)
BUN: 20 mg/dL (ref 6–20)
CHLORIDE: 94 mmol/L — AB (ref 101–111)
CO2: 34 mmol/L — AB (ref 22–32)
Calcium: 10 mg/dL (ref 8.9–10.3)
Creatinine, Ser: 1.09 mg/dL (ref 0.61–1.24)
GFR calc Af Amer: >60 mL/min (ref >60)
GFR calc non Af Amer: >60 mL/min (ref >60)
GLUCOSE: 95 mg/dL (ref 65–99)
POTASSIUM: 3.3 mmol/L — AB (ref 3.5–5.1)
Sodium: 139 mmol/L (ref 135–145)

## 2015-02-25 LAB — HEPATIC FUNCTION PANEL
ALT: 21 U/L (ref 17–63)
AST: 27 U/L (ref 15–41)
Albumin: 4 g/dL (ref 3.5–5.0)
Alkaline Phosphatase: 94 U/L (ref 38–126)
BILIRUBIN DIRECT: 0.8 mg/dL — AB (ref 0.1–0.5)
BILIRUBIN INDIRECT: 3 mg/dL — AB (ref 0.3–0.9)
Total Bilirubin: 3.8 mg/dL — ABNORMAL HIGH (ref 0.3–1.2)
Total Protein: 7.5 g/dL (ref 6.5–8.1)

## 2015-02-25 LAB — PROTIME-INR
INR: 2.95 — AB (ref 0.00–1.49)
Prothrombin Time: 30.2 seconds — ABNORMAL HIGH (ref 11.6–15.2)

## 2015-02-25 LAB — URINE MICROSCOPIC-ADD ON

## 2015-02-25 MED ORDER — DEXTROSE 5 % IV SOLN
1.0000 g | Freq: Once | INTRAVENOUS | Status: AC
  Administered 2015-02-25: 1 g via INTRAVENOUS
  Filled 2015-02-25: qty 10

## 2015-02-25 MED ORDER — SULFAMETHOXAZOLE-TRIMETHOPRIM 800-160 MG PO TABS: 1.0000 | ORAL_TABLET | Freq: Two times a day (BID) | ORAL | Status: AC

## 2015-02-25 MED ORDER — LIDOCAINE HCL 2 % EX GEL
1.0000 "application " | Freq: Once | CUTANEOUS | Status: AC
  Administered 2015-02-25: 1 via TOPICAL
  Filled 2015-02-25: qty 20

## 2015-02-25 NOTE — ED Notes (Signed)
Bladder scanner = >961 cc

## 2015-02-25 NOTE — ED Provider Notes (Signed)
CSN: [id number]     Arrival date & time 02/25/15  2202 History   First MD Initiated Contact with Patient 02/25/15 [phone]     Chief Complaint  Patient presents with  . Hematuria     HPI   Norman Lee is a 81 y.o. male with a PMH of CAD, HLD, HTN, prostate cancer, atrial fibrillation on coumadin who presents to the ED with decreased urine output. He reports he urinated "about a liter and a half" 2 days ago after taking lasix, but has not had very much urine output since that time. He states he urinated "a little" yesterday, but has not urinated today. He states this has never happened to him before. He has not tried anything for symptom relief. He denies fever, chills, headache, lightheadedness, dizziness, chest pain, shortness of breath, nausea, vomiting, diarrhea, constipation. He reports suprapubic abdominal pain, dysuria. He denies urgency, frequency, hematuria.    Past Medical History  Diagnosis Date  . Prostate cancer   . CAD (coronary artery disease)     Circumflex MI 1995, Last cath 2008  . Mitral regurgitation     Severe  . HLD (hyperlipidemia)   . HTN (hypertension)   . Atrial fibrillation   . Pulmonary hypertension   . Presence of permanent cardiac pacemaker 10/16/2006,09/04/12    Medtronic adapta  . Chronic anticoagulation   . Dysrhythmia     ATRIAL FIBRILATION  . Pacemaker    Past Surgical History  Procedure Laterality Date  . Permanent pacemaker insertion  10/16/2006    Medtronic Enrhythm  . Permanent pacemaker generator change  09/04/2012    Medtronic adapta  . Coronary angioplasty  09/16/1993    100% CX-successful  . Cardiac catheterization  05/11/2002    70-80% small first diagonal,50% RCA  . Cardiac catheterization  10/14/2006    multivessell,EF 40-50%  . Nm myoview ltd  08/30/2009    mod. perfusion defect-mid inferior,apical inferior,basal inferolateral,mid inferolateral & apical lateral regions  . US echocardiography  03/27/2012    severe MR persists  w/progressive LA dilatation,RSVP has increased & AS has progressed  . Permanent pacemaker generator change N/A 09/04/2012    Procedure: PERMANENT PACEMAKER GENERATOR CHANGE;  Surgeon: Sanda Klein, MD;  Location: [place] [facility];  Service: Cardiovascular;  Laterality: N/A;  . Cholecystectomy     Family History  Problem Relation Age of Onset  . Diabetes Sister    Social History  Substance Use Topics  . Smoking status: Never Smoker   . Smokeless tobacco: Never Used  . Alcohol Use: No      Review of Systems  Constitutional: Negative for fever, chills, activity change, appetite change and fatigue.  Eyes: Negative for visual disturbance.  Respiratory: Negative for shortness of breath.   Cardiovascular: Positive for leg swelling. Negative for chest pain and palpitations.       Reports lower extremity edema, unchanged from baseline.  Gastrointestinal: Positive for abdominal pain. Negative for nausea, vomiting, diarrhea, constipation and abdominal distention.       Reports suprapubic abdominal pain.  Genitourinary: Positive for dysuria and decreased urine volume. Negative for urgency, frequency and hematuria.  Musculoskeletal: Negative for myalgias, back pain, arthralgias, neck pain and neck stiffness.  Skin: Negative for color change, pallor, rash and wound.  Neurological: Negative for dizziness, syncope, weakness, light-headedness, numbness and headaches.  All other systems reviewed and are negative.     Allergies  Codeine  Home Medications   Prior to Admission medications   Medication Sig Start  Date End Date Taking? Authorizing Provider  aspirin 81 MG tablet Take 81 mg by mouth daily.   Yes Historical Provider, MD  furosemide (LASIX) 40 MG tablet Take 1 tablet twice a day as needed for swelling. Patient taking differently: Take 40 mg by mouth daily.  09/06/14  Yes Mihai Croitoru, MD  ramipril (ALTACE) 10 MG capsule TAKE 1 CAPSULE (10 MG TOTAL) BY MOUTH DAILY. 10/31/14  Yes Troy Sine, MD  simvastatin (ZOCOR) 20 MG tablet TAKE 1 TABLET (20 MG TOTAL) BY MOUTH AT BEDTIME. 05/09/14  Yes Troy Sine, MD  warfarin (COUMADIN) 5 MG tablet Take 2.5-5 mg by mouth See admin instructions. Take 1 tablet on Tuesday and Friday then take 1/2 tablet all the other days   Yes Historical Provider, MD  metolazone (ZAROXOLYN) 2.5 MG tablet Take 1 tablet by mouth 3 times a week on Monday, Wednesday, Friday Patient not taking: Reported on 02/25/2015 01/30/15   Mihai Croitoru, MD  warfarin (COUMADIN) 5 MG tablet TAKE 1 TABLET BY MOUTH DAILY OR AS DIRECTED Patient not taking: Reported on 02/25/2015 07/12/14   Troy Sine, MD    BP 128/81 mmHg  Pulse 70  Temp(Src) 98.3 F (36.8 C) (Oral)  Resp 24  Ht 6\' 2"  (1.88 m)  Wt 200 lb (90.719 kg)  BMI 25.67 kg/m2  SpO2 95% Physical Exam  Constitutional: He is oriented to person, place, and time. He appears well-developed and well-nourished. No distress.  HENT:  Head: Normocephalic and atraumatic.  Right Ear: External ear normal.  Left Ear: External ear normal.  Nose: Nose normal.  Mouth/Throat: Uvula is midline, oropharynx is clear and moist and mucous membranes are normal. No oropharyngeal exudate.  Eyes: Conjunctivae, EOM and lids are normal. Pupils are equal, round, and reactive to light. Right eye exhibits no discharge. Left eye exhibits no discharge. No scleral icterus.  Neck: Normal range of motion. Neck supple.  Cardiovascular: Normal rate, regular rhythm, intact distal pulses and normal pulses.   3/6 systolic murmur heard throughout precordium.  Pulmonary/Chest: Effort normal and breath sounds normal. No respiratory distress. He has no wheezes. He has no rales.  Abdominal: Soft. Normal appearance and bowel sounds are normal. He exhibits no distension and no mass. There is tenderness. There is no rigidity, no rebound and no guarding.  Mild TTP of suprapubic region. No rebound, guarding, or masses.  Musculoskeletal: Normal range of  motion. He exhibits edema. He exhibits no tenderness.  1+ pitting edema to lower extremities bilaterally.   Neurological: He is alert and oriented to person, place, and time.  Skin: Skin is warm, dry and intact. No rash noted. He is not diaphoretic. No erythema. No pallor.  Nailbeds appear jaundiced.  Psychiatric: He has a normal mood and affect. His speech is normal and behavior is normal. Judgment and thought content normal.  Nursing note and vitals reviewed.   ED Course  Procedures (including critical care time)  Labs Review Labs Reviewed  BASIC METABOLIC PANEL - Abnormal; Notable for the following:    Potassium 3.3 (*)    Chloride 94 (*)    CO2 34 (*)    All other components within normal limits  URINALYSIS, ROUTINE W REFLEX MICROSCOPIC (NOT AT [facility]) - Abnormal; Notable for the following:    Color, Urine RED (*)    APPearance CLOUDY (*)    Hgb urine dipstick LARGE (*)    Bilirubin Urine SMALL (*)    Ketones, ur 15 (*)  Nitrite POSITIVE (*)    Leukocytes, UA SMALL (*)    All other components within normal limits  PROTIME-INR - Abnormal; Notable for the following:    Prothrombin Time 30.2 (*)    INR 2.95 (*)    All other components within normal limits  URINE MICROSCOPIC-ADD ON - Abnormal; Notable for the following:    Bacteria, UA FEW (*)    All other components within normal limits  HEPATIC FUNCTION PANEL - Abnormal; Notable for the following:    Total Bilirubin 3.8 (*)    Bilirubin, Direct 0.8 (*)    Indirect Bilirubin 3.0 (*)    All other components within normal limits  URINE CULTURE  CBC WITH DIFFERENTIAL/PLATELET    Imaging Review No results found.   I have personally reviewed and evaluated these lab results as part of my medical decision-making.   EKG Interpretation   Date/Time:  Saturday February 25 2015 12:17:17 EDT Ventricular Rate:  70 PR Interval:  82 QRS Duration: 171 QT Interval:  491 QTC Calculation: 530 R Axis:   -90 Text  Interpretation:  Ventricular-paced rhythm No further analysis  attempted due to paced rhythm Baseline wander in lead(s) V1 paced, poor  baseline Confirmed by YAO  MD, DAVID ([id number]) on 02/25/2015 12:24:07 PM      MDM   Final diagnoses:  Hematuria  UTI (lower urinary tract infection)  Elevated bilirubin  Urinary retention    81 year old male presents with decreased urine output. Reports he had a liter and a half of urine output 2 days ago after taking lasix; has not had much urine output since then and has had no urine output today. Reports suprapubic abdominal pressure and dysuria. Denies urgency, frequency, hematuria. Has never experienced this before. Has a history of prostate cancer, states he still has his prostate.   Patient is afebrile. Vital signs stable. Mild TTP of suprapubic region of abdomen, no rebound, guarding, or masses. Bladder scan with >900 residual s/p 15 mL urine output. Unable to pass foley.   CBC negative for leukocytosis. BMP pending. INR 2.95. UA with large hemoglobin, positive nitrite, small leukocyte, TNTC RBC, 3-6 WBC. Urine culture pending. Will treat UTI with ceftriaxone in the ED and prescribe bactrim. Will place coude catheter for urinary retention. Hematuria most likely due to patient on chronic anticoagulation with coumadin, INR 2.95.   Patient's fingernail beds appear jaundiced. Total bili elevated at 3.8, direct 0.8, indirect 3.0. No TTP or RUQ. Reports he started taking metolazone 2 days ago, but stopped because he did not feel well.  Repeat LFTs in 1 week. Will send home on Bactrim BID x 1 week. Discontinue metolazone.  Patient to follow-up with PCP and urology.   BP 128/81 mmHg  Pulse 70  Temp(Src) 98.3 F (36.8 C) (Oral)  Resp 24  Ht 6\' 2"  (1.88 m)  Wt 200 lb (90.719 kg)  BMI 25.67 kg/m2  SpO2 95%     Marella Chimes, PA-C 02/26/15 0554  Varney Biles, MD 02/27/15 [phone]

## 2015-02-25 NOTE — ED Notes (Signed)
Per EMS pt started having issues with urine x 2 weeks c/o urine is red in color; Pt denies pain during urination; denies foul odor to urine; Pt a&o x 4

## 2015-02-25 NOTE — Discharge Instructions (Signed)
1. Medications: bactrim, usual home medications, discontinue metolazone 2. Treatment: rest, drink plenty of fluids 3. Follow Up: please followup with your primary doctor and urologist for discussion of your diagnoses and further evaluation after today's visit and for repeat LFTs in 1 week; please return to the ER for severe abdominal pain, fever, vomiting, new or worsening symptoms   Hematuria Hematuria is blood in your urine. It can be caused by a bladder infection, kidney infection, prostate infection, kidney stone, or cancer of your urinary tract. Infections can usually be treated with medicine, and a kidney stone usually will pass through your urine. If neither of these is the cause of your hematuria, further workup to find out the reason may be needed. It is very important that you tell your health care provider about any blood you see in your urine, even if the blood stops without treatment or happens without causing pain. Blood in your urine that happens and then stops and then happens again can be a symptom of a very serious condition. Also, pain is not a symptom in the initial stages of many urinary cancers. HOME CARE INSTRUCTIONS   Drink lots of fluid, 3-4 quarts a day. If you have been diagnosed with an infection, cranberry juice is especially recommended, in addition to large amounts of water.  Avoid caffeine, tea, and carbonated beverages because they tend to irritate the bladder.  Avoid alcohol because it may irritate the prostate.  Take all medicines as directed by your health care provider.  If you were prescribed an antibiotic medicine, finish it all even if you start to feel better.  If you have been diagnosed with a kidney stone, follow your health care provider's instructions regarding straining your urine to catch the stone.  Empty your bladder often. Avoid holding urine for long periods of time.  After a bowel movement, women should cleanse front to back. Use each tissue  only once.  Empty your bladder before and after sexual intercourse if you are a male. SEEK MEDICAL CARE IF:  You develop back pain.  You have a fever.  You have a feeling of sickness in your stomach (nausea) or vomiting.  Your symptoms are not better in 3 days. Return sooner if you are getting worse. SEEK IMMEDIATE MEDICAL CARE IF:   You develop severe vomiting and are unable to keep the medicine down.  You develop severe back or abdominal pain despite taking your medicines.  You begin passing a large amount of blood or clots in your urine.  You feel extremely weak or faint, or you pass out. MAKE SURE YOU:   Understand these instructions.  Will watch your condition.  Will get help right away if you are not doing well or get worse. Document Released: 06/03/2005 Document Revised: 10/18/2013 Document Reviewed: 02/01/2013 [facility] Patient Information 2015 [place], [place]. This information is not intended to replace advice given to you by your health care provider. Make sure you discuss any questions you have with your health care provider.  Jaundice  Jaundice is when the skin, whites of the eyes, and mucous membranes turn a yellowish color. It is caused by high levels of bilirubin in the blood. Bilirubin is produced by the normal breakdown of red blood cells. Jaundice may mean the liver or bile system in your body is not working right. HOME CARE  Rest.  Drink enough fluids to keep your pee (urine) clear or pale yellow.  Do not drink alcohol.  Only take medicine as told  by your doctor.  If you have jaundice because of viral hepatitis or an infection:  Avoid close contact with people.  Avoid making food for others.  Avoid sharing eating utensils with others.  Wash your hands often.  Keep all follow-up visits with your doctor.  Use skin lotion to help with itching. GET HELP RIGHT AWAY IF:  You have more pain.  You keep throwing up (vomiting).  You lose too  much body fluid (dehydration).  You have a fever or persistent symptoms for more than 72 hours.  You have a fever and your symptoms suddenly get worse.  You become weak or confused.  You develop a severe headache. MAKE SURE YOU:  Understand these instructions.  Will watch your condition.  Will get help right away if you are not doing well or get worse. Document Released: 07/06/2010 Document Revised: 08/26/2011 Document Reviewed: 07/06/2010 [facility] Patient Information 2015 [place], [place]. This information is not intended to replace advice given to you by your health care provider. Make sure you discuss any questions you have with your health care provider.  Acute Urinary Retention Acute urinary retention is the temporary inability to urinate. This is a common problem in older men. As men age their prostates become larger and block the flow of urine from the bladder. This is usually a problem that has come on gradually.  HOME CARE INSTRUCTIONS If you are sent home with a Foley catheter and a drainage system, you will need to discuss the best course of action with your health care provider. While the catheter is in, maintain a good intake of fluids. Keep the drainage bag emptied and lower than your catheter. This is so that contaminated urine will not flow back into your bladder, which could lead to a urinary tract infection. There are two main types of drainage bags. One is a large bag that usually is used at night. It has a good capacity that will allow you to sleep through the night without having to empty it. The second type is called a leg bag. It has a smaller capacity, so it needs to be emptied more frequently. However, the main advantage is that it can be attached by a leg strap and can go underneath your clothing, allowing you the freedom to move about or leave your home. Only take over-the-counter or prescription medicines for pain, discomfort, or fever as directed by your health  care provider.  SEEK MEDICAL CARE IF:  You develop a low-grade fever.  You experience spasms or leakage of urine with the spasms. SEEK IMMEDIATE MEDICAL CARE IF:   You develop chills or fever.  Your catheter stops draining urine.  Your catheter falls out.  You start to develop increased bleeding that does not respond to rest and increased fluid intake. MAKE SURE YOU:  Understand these instructions.  Will watch your condition.  Will get help right away if you are not doing well or get worse. Document Released: 09/09/2000 Document Revised: 06/08/2013 Document Reviewed: 11/12/2012 [facility] Patient Information 2015 [place], [place]. This information is not intended to replace advice given to you by your health care provider. Make sure you discuss any questions you have with your health care provider.  Urinary Tract Infection Urinary tract infections (UTIs) can develop anywhere along your urinary tract. Your urinary tract is your body's drainage system for removing wastes and extra water. Your urinary tract includes two kidneys, two ureters, a bladder, and a urethra. Your kidneys are a pair of bean-shaped organs.  Each kidney is about the size of your fist. They are located below your ribs, one on each side of your spine. CAUSES Infections are caused by microbes, which are microscopic organisms, including fungi, viruses, and bacteria. These organisms are so small that they can only be seen through a microscope. Bacteria are the microbes that most commonly cause UTIs. SYMPTOMS  Symptoms of UTIs may vary by age and gender of the patient and by the location of the infection. Symptoms in young women typically include a frequent and intense urge to urinate and a painful, burning feeling in the bladder or urethra during urination. Older women and men are more likely to be tired, shaky, and weak and have muscle aches and abdominal pain. A fever may mean the infection is in your kidneys. Other  symptoms of a kidney infection include pain in your back or sides below the ribs, nausea, and vomiting. DIAGNOSIS To diagnose a UTI, your caregiver will ask you about your symptoms. Your caregiver also will ask to provide a urine sample. The urine sample will be tested for bacteria and white blood cells. White blood cells are made by your body to help fight infection. TREATMENT  Typically, UTIs can be treated with medication. Because most UTIs are caused by a bacterial infection, they usually can be treated with the use of antibiotics. The choice of antibiotic and length of treatment depend on your symptoms and the type of bacteria causing your infection. HOME CARE INSTRUCTIONS  If you were prescribed antibiotics, take them exactly as your caregiver instructs you. Finish the medication even if you feel better after you have only taken some of the medication.  Drink enough water and fluids to keep your urine clear or pale yellow.  Avoid caffeine, tea, and carbonated beverages. They tend to irritate your bladder.  Empty your bladder often. Avoid holding urine for long periods of time.  Empty your bladder before and after sexual intercourse.  After a bowel movement, women should cleanse from front to back. Use each tissue only once. SEEK MEDICAL CARE IF:   You have back pain.  You develop a fever.  Your symptoms do not begin to resolve within 3 days. SEEK IMMEDIATE MEDICAL CARE IF:   You have severe back pain or lower abdominal pain.  You develop chills.  You have nausea or vomiting.  You have continued burning or discomfort with urination. MAKE SURE YOU:   Understand these instructions.  Will watch your condition.  Will get help right away if you are not doing well or get worse. Document Released: 03/13/2005 Document Revised: 12/03/2011 Document Reviewed: 07/12/2011 [facility] Patient Information 2015 [place], [place]. This information is not intended to replace advice given to  you by your health care provider. Make sure you discuss any questions you have with your health care provider.

## 2015-02-25 NOTE — ED Notes (Signed)
Coude Foley placed by Dr. Darl Householder.

## 2015-02-26 LAB — URINE CULTURE: CULTURE: NO GROWTH

## 2015-02-27 ENCOUNTER — Encounter: Payer: Self-pay | Admitting: Cardiovascular Disease

## 2015-03-06 ENCOUNTER — Observation Stay (HOSPITAL_COMMUNITY)
Admission: AD | Admit: 2015-03-06 | Discharge: 2015-03-08 | Disposition: A | Discharge status: Home / Self Care | Payer: Commercial Managed Care - HMO | Source: Ambulatory Visit | Attending: Urology | Admitting: Urology

## 2015-03-06 ENCOUNTER — Encounter (HOSPITAL_COMMUNITY): Payer: Self-pay | Admitting: *Deleted

## 2015-03-06 DIAGNOSIS — I272 Other secondary pulmonary hypertension: Secondary | ICD-10-CM | POA: Diagnosis not present

## 2015-03-06 DIAGNOSIS — I10 Essential (primary) hypertension: Secondary | ICD-10-CM | POA: Insufficient documentation

## 2015-03-06 DIAGNOSIS — Z8546 Personal history of malignant neoplasm of prostate: Secondary | ICD-10-CM | POA: Diagnosis not present

## 2015-03-06 DIAGNOSIS — I270 Primary pulmonary hypertension: Secondary | ICD-10-CM

## 2015-03-06 DIAGNOSIS — Z79899 Other long term (current) drug therapy: Secondary | ICD-10-CM | POA: Diagnosis not present

## 2015-03-06 DIAGNOSIS — Z7952 Long term (current) use of systemic steroids: Secondary | ICD-10-CM | POA: Insufficient documentation

## 2015-03-06 DIAGNOSIS — Z950 Presence of cardiac pacemaker: Secondary | ICD-10-CM | POA: Diagnosis not present

## 2015-03-06 DIAGNOSIS — E785 Hyperlipidemia, unspecified: Secondary | ICD-10-CM | POA: Insufficient documentation

## 2015-03-06 DIAGNOSIS — I4891 Unspecified atrial fibrillation: Secondary | ICD-10-CM | POA: Insufficient documentation

## 2015-03-06 DIAGNOSIS — Z23 Encounter for immunization: Secondary | ICD-10-CM | POA: Insufficient documentation

## 2015-03-06 DIAGNOSIS — Z7901 Long term (current) use of anticoagulants: Secondary | ICD-10-CM | POA: Diagnosis not present

## 2015-03-06 DIAGNOSIS — I2510 Atherosclerotic heart disease of native coronary artery without angina pectoris: Secondary | ICD-10-CM | POA: Diagnosis not present

## 2015-03-06 DIAGNOSIS — K5900 Constipation, unspecified: Secondary | ICD-10-CM | POA: Diagnosis not present

## 2015-03-06 DIAGNOSIS — R627 Adult failure to thrive: Secondary | ICD-10-CM | POA: Diagnosis not present

## 2015-03-06 DIAGNOSIS — I340 Nonrheumatic mitral (valve) insufficiency: Secondary | ICD-10-CM | POA: Insufficient documentation

## 2015-03-06 DIAGNOSIS — R972 Elevated prostate specific antigen [PSA]: Secondary | ICD-10-CM | POA: Diagnosis not present

## 2015-03-06 DIAGNOSIS — Z681 Body mass index (BMI) 19 or less, adult: Secondary | ICD-10-CM | POA: Diagnosis not present

## 2015-03-06 DIAGNOSIS — M1990 Unspecified osteoarthritis, unspecified site: Secondary | ICD-10-CM | POA: Insufficient documentation

## 2015-03-06 DIAGNOSIS — E876 Hypokalemia: Secondary | ICD-10-CM | POA: Insufficient documentation

## 2015-03-06 DIAGNOSIS — E440 Moderate protein-calorie malnutrition: Secondary | ICD-10-CM | POA: Insufficient documentation

## 2015-03-06 DIAGNOSIS — C61 Malignant neoplasm of prostate: Secondary | ICD-10-CM

## 2015-03-06 DIAGNOSIS — R310 Gross hematuria: Principal | ICD-10-CM | POA: Insufficient documentation

## 2015-03-06 DIAGNOSIS — Z87891 Personal history of nicotine dependence: Secondary | ICD-10-CM | POA: Insufficient documentation

## 2015-03-06 DIAGNOSIS — Z7982 Long term (current) use of aspirin: Secondary | ICD-10-CM | POA: Insufficient documentation

## 2015-03-06 LAB — COMPREHENSIVE METABOLIC PANEL
ALT: 22 U/L (ref 17–63)
AST: 30 U/L (ref 15–41)
Albumin: 3.8 g/dL (ref 3.5–5.0)
Alkaline Phosphatase: 112 U/L (ref 38–126)
Anion gap: 9 (ref 5–15)
BUN: 23 mg/dL — ABNORMAL HIGH (ref 6–20)
CHLORIDE: 96 mmol/L — AB (ref 101–111)
CO2: 31 mmol/L (ref 22–32)
Calcium: 8.9 mg/dL (ref 8.9–10.3)
Creatinine, Ser: 0.93 mg/dL (ref 0.61–1.24)
Glucose, Bld: 82 mg/dL (ref 65–99)
POTASSIUM: 3 mmol/L — AB (ref 3.5–5.1)
Sodium: 136 mmol/L (ref 135–145)
Total Bilirubin: 3.4 mg/dL — ABNORMAL HIGH (ref 0.3–1.2)
Total Protein: 7.2 g/dL (ref 6.5–8.1)

## 2015-03-06 LAB — CBC WITH DIFFERENTIAL/PLATELET
BASOS ABS: 0.1 10*3/uL (ref 0.0–0.1)
Basophils Relative: 1 %
EOS ABS: 0 10*3/uL (ref 0.0–0.7)
EOS PCT: 0 %
HCT: 42.1 % (ref 39.0–52.0)
Hemoglobin: 14.5 g/dL (ref 13.0–17.0)
LYMPHS ABS: 1.6 10*3/uL (ref 0.7–4.0)
LYMPHS PCT: 14 %
MCH: 31.6 pg (ref 26.0–34.0)
MCHC: 34.4 g/dL (ref 30.0–36.0)
MCV: 91.7 fL (ref 78.0–100.0)
MONO ABS: 1.4 10*3/uL — AB (ref 0.1–1.0)
Monocytes Relative: 12 %
Neutro Abs: 8.3 10*3/uL — ABNORMAL HIGH (ref 1.7–7.7)
Neutrophils Relative %: 73 %
PLATELETS: 202 10*3/uL (ref 150–400)
RBC: 4.59 MIL/uL (ref 4.22–5.81)
RDW: 14.7 % (ref 11.5–15.5)
WBC: 11.4 10*3/uL — ABNORMAL HIGH (ref 4.0–10.5)

## 2015-03-06 LAB — URINALYSIS, ROUTINE W REFLEX MICROSCOPIC
GLUCOSE, UA: NEGATIVE mg/dL
Ketones, ur: NEGATIVE mg/dL
NITRITE: POSITIVE — AB
PH: 5 (ref 5.0–8.0)
Protein, ur: 100 mg/dL — AB
SPECIFIC GRAVITY, URINE: 1.026 (ref 1.005–1.030)
Urobilinogen, UA: 2 mg/dL — ABNORMAL HIGH (ref 0.0–1.0)

## 2015-03-06 LAB — TSH: TSH: 1.331 u[IU]/mL (ref 0.350–4.500)

## 2015-03-06 LAB — TYPE AND SCREEN
ABO/RH(D): O POS
ANTIBODY SCREEN: NEGATIVE

## 2015-03-06 LAB — FOLATE: FOLATE: 14.6 ng/mL (ref >5.9)

## 2015-03-06 LAB — URINE MICROSCOPIC-ADD ON

## 2015-03-06 LAB — PROTIME-INR
INR: 3.01 — AB (ref 0.00–1.49)
PROTHROMBIN TIME: 30.7 s — AB (ref 11.6–15.2)

## 2015-03-06 LAB — VITAMIN B12: Vitamin B-12: 418 pg/mL (ref 180–914)

## 2015-03-06 LAB — PSA: PSA: 18.25 ng/mL — AB (ref 0.00–4.00)

## 2015-03-06 LAB — ABO/RH: ABO/RH(D): O POS

## 2015-03-06 MED ORDER — SENNA 8.6 MG PO TABS
1.0000 | ORAL_TABLET | Freq: Two times a day (BID) | ORAL | Status: DC
  Administered 2015-03-06 – 2015-03-08 (×3): 8.6 mg via ORAL
  Filled 2015-03-06 (×3): qty 1

## 2015-03-06 MED ORDER — INFLUENZA VAC SPLIT QUAD 0.5 ML IM SUSY
0.5000 mL | PREFILLED_SYRINGE | INTRAMUSCULAR | Status: AC
  Administered 2015-03-07: 0.5 mL via INTRAMUSCULAR
  Filled 2015-03-06 (×2): qty 0.5

## 2015-03-06 MED ORDER — DIPHENHYDRAMINE HCL 12.5 MG/5ML PO ELIX: 12.5000 mg | ORAL_SOLUTION | Freq: Four times a day (QID) | ORAL | Status: DC | PRN

## 2015-03-06 MED ORDER — FUROSEMIDE 40 MG PO TABS
40.0000 mg | ORAL_TABLET | Freq: Two times a day (BID) | ORAL | Status: DC
  Administered 2015-03-06 – 2015-03-08 (×4): 40 mg via ORAL
  Filled 2015-03-06 (×4): qty 1

## 2015-03-06 MED ORDER — DIPHENHYDRAMINE HCL 50 MG/ML IJ SOLN: 12.5000 mg | Freq: Four times a day (QID) | INTRAMUSCULAR | Status: DC | PRN

## 2015-03-06 MED ORDER — ONDANSETRON HCL 4 MG/2ML IJ SOLN: 4.0000 mg | INTRAMUSCULAR | Status: DC | PRN

## 2015-03-06 MED ORDER — BACITRACIN-NEOMYCIN-POLYMYXIN 400-5-5000 EX OINT
1.0000 | TOPICAL_OINTMENT | Freq: Three times a day (TID) | CUTANEOUS | Status: DC | PRN
Start: 2015-03-06 — End: 2015-03-08

## 2015-03-06 MED ORDER — SENNOSIDES-DOCUSATE SODIUM 8.6-50 MG PO TABS
1.0000 | ORAL_TABLET | Freq: Every evening | ORAL | Status: DC | PRN
Start: 2015-03-06 — End: 2015-03-08
  Filled 2015-03-06: qty 1

## 2015-03-06 MED ORDER — ENSURE ENLIVE PO LIQD
237.0000 mL | Freq: Two times a day (BID) | ORAL | Status: DC
  Administered 2015-03-06 – 2015-03-08 (×3): 237 mL via ORAL

## 2015-03-06 MED ORDER — ACETAMINOPHEN 325 MG PO TABS: 650.0000 mg | ORAL_TABLET | ORAL | Status: DC | PRN

## 2015-03-06 NOTE — Consult Note (Signed)
[facility] Medical Consultation  TRAVANTI MCMANUS TGG:[id number] DOB: July 20, 1928 DOA: 03/06/2015 PCP: Cletis Athens, MD   Requesting physician: Dr Gaynelle Arabian Date of consultation: 03/06/2015 Reason for consultation: Medical Management  Impression/Recommendations Principal Problem:   Gross hematuria Active Problems:   A-fib   Pacemaker - Medtronic Adapta 2014, leads 2008   Mitral regurgitation, Severe   Prostate cancer   HTN (hypertension)   Pulmonary HTN   Anticoagulated on warfarin    1. Functional decline/failure to thrive. 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 He has a cardiac history which includes severe mitral regurgitation. Symptoms could be related to progression of cardiac disease. Other possibilities include malignancy particularly with his history of prostate cancer. Case was discussed with Dr.Tannenbaum as patient be further worked up with a CT scan of abdomen and pelvis, bone scan, and MRI of the brain to assess for the possibility of metastatic disease. Would also recommend checking a TSH along with B-12 and folate. He was recently diagnosed with urinary tract infection will follow-up on urinalysis. Physical therapy consulted. 2. Severe mitral regurgitation. His last transthoracic echocardiogram was performed 10/16/2013 which revealed a mobile density that most likely represents a flail chordae tendon 8. There was moderate severe MR was severely calcified anulus. He was last seen by cardiology on 01/30/2015 at which time metolazone 2.5 mg was added to his regimen. He had been on Lasix 40 mg twice a day. Will continue diuretic therapy with Lasix 40 mg BID. Monitor ins and outs and daily weights. 3. Atrial fibrillation CHADSVasc score of 5. He presents with hematuria likely secondary to trauma from Foley  catheter placement on a recent emergency room visit. For now will hold anticoagulation, follow-up on CBC which is currently pending. 4. Status post pacemaker implant. Recently interrogated at a cardiology appointment, and functioning normally. 5. Hypertension. Blood pressures are stable, hold ACE inhibitor until conference metabolic panel is available. 6. History of prostate cancer. As mentioned above there is concern for the possibility of underlying malignancy causing failure to thrive, follow-up on CT imaging. 7. DVT prophylaxis. SCDs 8. I discussed CODE STATUS with patient and his wife, he is a full code.  I will followup again tomorrow. Please contact me if I can be of assistance in the meanwhile. Thank you for this consultation.  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 He was recently seen in the emergency department on 02/25/2015 at which time he presented with complaints of decreased urine output. He was found to have urinary retention with greater than 900 mL on bladder scan. A Foley catheter was placed during that visit and was instructed to follow-up with his urologist. 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 Symptoms worsening over the past 2 weeks. 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 He denies fevers, chills, nausea, vomiting, diarrhea, constipation, bloody stools, abdominal pain. He has a Foley catheter in  place with  pink colored urine.    Review of Systems:  Constitutional:  No weight loss, night sweats, Fevers, chills, positive for fatigue, generalized weakness, malaise, unsteadiness and weight loss.  HEENT:  No headaches, Difficulty swallowing,Tooth/dental problems,Sore throat,  No sneezing, itching, ear ache, nasal congestion, post nasal drip,  Cardio-vascular:  No chest pain, Orthopnea, PND, swelling in lower extremities, anasarca, dizziness, palpitations  GI:  No heartburn, indigestion, abdominal pain, nausea, vomiting, diarrhea, change in bowel habits, loss of appetite  Resp:  No shortness of breath with exertion or at rest. No excess mucus, no productive cough, No non-productive cough, No coughing up of blood.No change in color of mucus.No wheezing.No chest wall deformity  Skin:  no rash or lesions.  GU:  no dysuria, change in color of urine, no urgency or frequency. No flank pain.  Musculoskeletal:  No joint pain or swelling. No decreased range of motion. No back pain.  Psych:  No change in mood or affect. No depression or anxiety. No memory loss.   Past Medical History  Diagnosis Date  . Prostate cancer   . CAD (coronary artery disease)     Circumflex MI 1995, Last cath 2008  . Mitral regurgitation     Severe  . HLD (hyperlipidemia)   . HTN (hypertension)   . Atrial fibrillation   . Pulmonary hypertension   . Presence of permanent cardiac pacemaker 10/16/2006,09/04/12    Medtronic adapta  . Chronic anticoagulation   . Dysrhythmia     ATRIAL FIBRILATION  . Pacemaker    Past Surgical History  Procedure Laterality Date  . Permanent pacemaker insertion  10/16/2006    Medtronic Enrhythm  . Permanent pacemaker generator change  09/04/2012    Medtronic adapta  . Coronary angioplasty  09/16/1993    100% CX-successful  . Cardiac catheterization  05/11/2002    70-80% small first diagonal,50% RCA  . Cardiac catheterization  10/14/2006    multivessell,EF 40-50%  . Nm myoview  ltd  08/30/2009    mod. perfusion defect-mid inferior,apical inferior,basal inferolateral,mid inferolateral & apical lateral regions  . US echocardiography  03/27/2012    severe MR persists w/progressive LA dilatation,RSVP has increased & AS has progressed  . Permanent pacemaker generator change N/A 09/04/2012    Procedure: PERMANENT PACEMAKER GENERATOR CHANGE;  Surgeon: Sanda Klein, MD;  Location: [facility];  Service: Cardiovascular;  Laterality: N/A;  . Cholecystectomy     Social History:  reports that he has never smoked. He has never used smokeless tobacco. He reports that he does not drink alcohol or use illicit drugs.  Allergies  Allergen Reactions  . Codeine Nausea And Vomiting   Family History  Problem Relation Age of Onset  . Diabetes Sister     Prior to Admission medications   Medication Sig Start Date End Date Taking? Authorizing Provider  acetaminophen (TYLENOL) 500 MG tablet Take 500-1,000 mg by mouth every 6 (six) hours as needed for moderate pain.   Yes Historical Provider, MD  aspirin 81 MG tablet Take 81 mg by mouth daily.   Yes Historical Provider, MD  furosemide (LASIX) 40 MG tablet Take 1 tablet twice a day as needed for swelling. Patient taking differently: Take 40 mg by mouth daily.  09/06/14  Yes Mihai Croitoru, MD  hydrocortisone cream 1 % Apply 1 application topically daily as needed for itching (psoriasis).   Yes Historical Provider, MD  ramipril (ALTACE) 10 MG capsule TAKE 1 CAPSULE (10 MG TOTAL) BY MOUTH DAILY. 10/31/14  Yes Marcello Moores  Floyce Stakes, MD  simvastatin (ZOCOR) 20 MG tablet TAKE 1 TABLET (20 MG TOTAL) BY MOUTH AT BEDTIME. 05/09/14  Yes Troy Sine, MD  warfarin (COUMADIN) 5 MG tablet Take 2.5-5 mg by mouth See admin instructions. Take 1 tablet on Tuesday and Friday then take 1/2 tablet all the other days   Yes Historical Provider, MD  metolazone (ZAROXOLYN) 2.5 MG tablet Take 1 tablet by mouth 3 times a week on Monday, Wednesday, Friday Patient not  taking: Reported on 02/25/2015 01/30/15   Dani Gobble Croitoru, MD  warfarin (COUMADIN) 5 MG tablet TAKE 1 TABLET BY MOUTH DAILY OR AS DIRECTED Patient not taking: Reported on 03/06/2015 02/28/15   Troy Sine, MD   Physical Exam: There were no vitals taken for this visit. There were no vitals filed for this visit.   General:  Thin appearing gentleman, no acute distress, he is awake and alert oriented  Eyes: Pupils are equal round reactive to light extraocular movement is intact  Neck: Supple symmetrical no jugular venous distention  Cardiovascular: Irregular rate and rhythm normal S1-S2 no extremity edema  Respiratory: Normal respiratory effort, lungs clear to auscultation bilaterally no wheezing rhonchi or rales  Abdomen: Soft nontender nondistended positive bowel sounds  Skin: He has multiple hematomas across bilateral upper extremities  Musculoskeletal: Significant muscle atrophy bilateral extremities, having 1+ pedal edema  Psychiatric: He is awake and alert oriented 3, appropriate, able to provide history.  Neurologic: Patient is hard of hearing, no facial droop or slurred speech. He has 5 of 5 muscle strength to bilateral upper and lower extremities without alteration to sensation. He is unstable with standing up  Labs on Admission:  Basic Metabolic Panel: No results for input(s): NA, K, CL, CO2, GLUCOSE, BUN, CREATININE, CALCIUM, MG, PHOS in the last 168 hours. Liver Function Tests: No results for input(s): AST, ALT, ALKPHOS, BILITOT, PROT, ALBUMIN in the last 168 hours. No results for input(s): LIPASE, AMYLASE in the last 168 hours. No results for input(s): AMMONIA in the last 168 hours. CBC: No results for input(s): WBC, NEUTROABS, HGB, HCT, MCV, PLT in the last 168 hours. Cardiac Enzymes: No results for input(s): CKTOTAL, CKMB, CKMBINDEX, TROPONINI in the last 168 hours. BNP: Invalid input(s): POCBNP CBG: No results for input(s): GLUCAP in the last 168  hours.  Radiological Exams on Admission: No results found.  EKG: Independently reviewed.   Time spent: 60 min  Kelvin Cellar [facility] Pager [phone]  If 7PM-7AM, please contact night-coverage [email or web address] Password [facility] 03/06/2015, 2:49 PM

## 2015-03-06 NOTE — H&P (Signed)
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    He was seen in the emergency department on September 10 for decreased urinary output. He states having a liter and a half of output 2 days prior to his presentation after he took Lasix and metolazone for edema and congestive heart failure. He is followed by [facility] and [facility]. He has a history of pacemaker and atrial fibrillation. He reports not having any urine output the day of his presentation to the ED. His only complaint was suprapubic abdominal pressure and dysuria. A Coude catheter was used to drain greater than 900 mL of urine out of the patient's bladder after a failed insertion attempt with a regular Foley catheter. The patient's urinalysis appeared infected and he was discharged on Bactrim. His urine culture was negative. The patient himself switched from Bactrim to Cipro because he did not like the side effects profile of the Bactrim.     Physical exam revealed some jaundice of the patient's fingernails and his bilirubin was noted to be elevated at 3.8. His INR was 2.95. He is on Coumadin. Patient was told to follow up with urology and his primary care for further evaluation.     He was last seen by Dr. Gaynelle Arabian in 2014. Patient endorses managing his own primary care needs as he is a retired Engineer, drilling. He states he gets regular blood work from Catering manager. He continues on Coumadin daily. He has not had any physician follow-up since being discharged from the hospital.    The patient's wife present states the patient has been losing weight over the past couple of months and is constantly consent complaining of fatigue and poor appetite. She endorses the patient to be intermittently confused and suffering from frequent falls. She also endorses the patient to have lost at least 30 pounds over the past couple of  months.       Past Medical History Problems  1. History of Arthritis 2. History of Atrial fibrillation (I48.91) 3. History of Cardiac Cath Indications: Cardiac Arrhythmia 4. History of cardiac disorder (Z86.79) 5. History of hypercholesterolemia (Z86.39) 6. History of hypertension (Z86.79)  Surgical History Problems  1. History of Cath Laser Angioplasty 2. History of Gallbladder Surgery 3. History of Inguinal Hernia Repair 4. History of Kidney Surgery 5. History of Pacemaker Placement 6. History of Surgery Prostate Cryosurgical Ablation  Current Meds 1. Altace TABS;  Therapy: (Recorded:01Dec2010) to Recorded 2. Aspirin 81 MG TABS;  Therapy: (Recorded:01Dec2010) to Recorded 3. Betamethasone Dipropionate Aug 0.05 % External Cream;  Therapy: 11Aug2010 to Recorded 4. Cipro TABS;  Therapy: (Recorded:19Sep2016) to Recorded 5. Coumadin TABS;  Therapy: (Recorded:01Dec2010) to Recorded 6. Furosemide 40 MG Oral Tablet;  Therapy: [id number] to Recorded 7. Ramipril 10 MG Oral Capsule;  Therapy: [id number] to Recorded 8. Zocor 20 MG Oral Tablet;  Therapy: (Recorded:01Dec2010) to Recorded  Allergies Medication  1. Codeine Derivatives  Family History Problems  1. Family history of Congestive Heart Failure : Mother 2. Family history of Death In The Family Father : Father   father passed @ age 57heart 3. Family history of Death In The Family Mother : Mother   mother passed @ age 3old age/CHF 28. Family history of Family Health Status Number Of Children   1 son 5. Family history of Heart Disease : Father 69. Family history of Nephrolithiasis :  Paternal Uncle  Social History Problems  1. Alcohol Use   one drink daily 2. Caffeine Use   2 drinks daily 3. History of tobacco use (Z87.891)   smoked one pack dailysmoked for 10 yearsquit smoking 30 years ago 4. Marital History - Currently Married 5. Occupation:   physician  Review of Systems  Genitourinary:  hematuria.  Constitutional: feeling poorly (malaise), feeling tired (fatigue) and recent 30 lb weight loss.  Hematologic/Lymphatic: tendency to bleed easily and a tendency to easily bruise.  Cardiovascular: leg swelling.  Endocrine: muscle weakness.  Neurological: confusion and difficulty walking.    Vitals Vital Signs [Data Includes: Last 1 Day]  Recorded: 19Sep2016 11:27AM  Blood Pressure: 101 / 61 Temperature: 97.1 F Heart Rate: 76  Physical Exam Constitutional: Well nourished and well developed . No acute distress. Several stages of healing bruises noted on face and upper extremities.   ENT:. The ears and nose are normal in appearance.   Neck: The appearance of the neck is normal and no neck mass is present.   Pulmonary: No respiratory distress and normal respiratory rhythm and effort.   Cardiovascular: Heart rate and rhythm are normal . No peripheral edema.   Abdomen: The abdomen is soft and nontender. No masses are palpated. No CVA tenderness. No hernias are palpable. No hepatosplenomegaly noted.   Genitourinary: Examination of the penis demonstrates an indwelling catheter, but no discharge, no masses, no lesions and a normal meatus. The penis is uncircumcised. The scrotum is without lesions. The right epididymis is palpably normal and non-tender. The left epididymis is palpably normal and non-tender. The right testis is non-tender and without masses. The left testis is non-tender and without masses.   Lymphatics: The femoral and inguinal nodes are not enlarged or tender.   Skin: Normal skin turgor, no visible rash and no visible skin lesions.   Neuro/Psych:. Mood and affect are appropriate.    Results/Data  Discussed:  Consult with Dr Gaynelle Arabian.    Procedure A 20 French catheter was inserted into the patient's bladder and irrigated with several hundred mL of sterile water. No bright red blood was appreciated. It was noted that he had several old-appearing clots and his  urine was the color of brown indicating old coagulated blood. The urine was never irrigated to clear but it is now noted to be draining appropriately within his catheter bag.     Assessment Assessed  1. Gross hematuria (R31.0) 2. 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 He has had recent urinary retention, with traumatic foley catheterization in the face of Coumadin anticoagulation for A. Fibrillation, with resulting continuing gross hematuria. He has ataxia, and multiple fascial bruises. I have had extensive discussion with his wif ,as well as the patient, and he will be admitted or further evaluation. ( Medicine notified).   Plan Elevated prostate specific antigen (PSA), Gross hematuria, Prostate cancer  1. Follow-up Office  Follow-up after hospital evaluation.  Status: Hold For -  Appointment,Date of Service  Requested for: 19Sep2016 Gross hematuria  2. BASIC METABOLIC PANEL; Status:Hold For - Specimen/Data Collection,Appointment;  Requested IOX:[id number];  3. Cath & Irrigation; Status:Hold For - Appointment,Date of Service; Requested  QAS:[id number];  4. PROTHROMBIN TIME (PT); Status:Hold For - Specimen/Data Collection,Appointment;  Requested for:19Sep2016;   Proceed to [facility] for admitting.  F/U per urologist discretion.   Discussion/Summary It appears that the patient's combined diuretic use placed him into urinary retention. A  port catheter insertion attempt resulted in trauma and subsequent gross hematuria. This explains the continued bleeding into his catheter bag over the left the last week. His situation is kind of unique because the patient has several chronic comorbidities but is managing most of them himself. I spoke with Dr. Gaynelle Arabian regarding his situation. He recommended that we obtain stat labs today and schedule the patient for an outpatient CT PET scan. Further discussion with the Dr. and the patient with  his wife present has led to the decision to admit this patient for further evaluation. Dr. Gaynelle Arabian is going to arrange to be consulted by several disciplines to help continue evaluation. We will leave his catheter in place and have him present to [facility] for further treatment.     Signatures Electronically signed by : Carolan Clines, M.D.; Mar 06 2015  1:57PM EST

## 2015-03-06 NOTE — H&P (Signed)
Done

## 2015-03-07 ENCOUNTER — Encounter (HOSPITAL_COMMUNITY): Payer: Self-pay | Admitting: Radiology

## 2015-03-07 ENCOUNTER — Inpatient Hospital Stay (HOSPITAL_COMMUNITY): Payer: Commercial Managed Care - HMO

## 2015-03-07 DIAGNOSIS — R310 Gross hematuria: Secondary | ICD-10-CM | POA: Diagnosis not present

## 2015-03-07 DIAGNOSIS — C61 Malignant neoplasm of prostate: Secondary | ICD-10-CM

## 2015-03-07 DIAGNOSIS — E440 Moderate protein-calorie malnutrition: Secondary | ICD-10-CM | POA: Insufficient documentation

## 2015-03-07 DIAGNOSIS — I482 Chronic atrial fibrillation: Secondary | ICD-10-CM

## 2015-03-07 DIAGNOSIS — I10 Essential (primary) hypertension: Secondary | ICD-10-CM | POA: Diagnosis not present

## 2015-03-07 DIAGNOSIS — I340 Nonrheumatic mitral (valve) insufficiency: Secondary | ICD-10-CM | POA: Diagnosis not present

## 2015-03-07 DIAGNOSIS — Z7901 Long term (current) use of anticoagulants: Secondary | ICD-10-CM | POA: Diagnosis not present

## 2015-03-07 LAB — COMPREHENSIVE METABOLIC PANEL
ALBUMIN: 3.1 g/dL — AB (ref 3.5–5.0)
ALT: 16 U/L — ABNORMAL LOW (ref 17–63)
ANION GAP: 7 (ref 5–15)
AST: 22 U/L (ref 15–41)
Alkaline Phosphatase: 89 U/L (ref 38–126)
BUN: 24 mg/dL — ABNORMAL HIGH (ref 6–20)
CHLORIDE: 98 mmol/L — AB (ref 101–111)
CO2: 32 mmol/L (ref 22–32)
Calcium: 8.3 mg/dL — ABNORMAL LOW (ref 8.9–10.3)
Creatinine, Ser: 1.1 mg/dL (ref 0.61–1.24)
GFR calc non Af Amer: 60 mL/min — ABNORMAL LOW (ref >60)
GLUCOSE: 103 mg/dL — AB (ref 65–99)
Potassium: 3.1 mmol/L — ABNORMAL LOW (ref 3.5–5.1)
SODIUM: 137 mmol/L (ref 135–145)
Total Bilirubin: 2.4 mg/dL — ABNORMAL HIGH (ref 0.3–1.2)
Total Protein: 5.6 g/dL — ABNORMAL LOW (ref 6.5–8.1)

## 2015-03-07 LAB — PROTIME-INR
INR: 3.06 — ABNORMAL HIGH (ref 0.00–1.49)
Prothrombin Time: 31.1 seconds — ABNORMAL HIGH (ref 11.6–15.2)

## 2015-03-07 LAB — CBC
HEMATOCRIT: 37.9 % — AB (ref 39.0–52.0)
HEMOGLOBIN: 13.1 g/dL (ref 13.0–17.0)
MCH: 31.2 pg (ref 26.0–34.0)
MCHC: 34.6 g/dL (ref 30.0–36.0)
MCV: 90.2 fL (ref 78.0–100.0)
Platelets: 191 10*3/uL (ref 150–400)
RBC: 4.2 MIL/uL — AB (ref 4.22–5.81)
RDW: 14.5 % (ref 11.5–15.5)
WBC: 8.9 10*3/uL (ref 4.0–10.5)

## 2015-03-07 LAB — TESTOSTERONE: Testosterone: 170 ng/dL — ABNORMAL LOW (ref 348–1197)

## 2015-03-07 MED ORDER — TECHNETIUM TC 99M MEDRONATE IV KIT
25.0000 | PACK | Freq: Once | INTRAVENOUS | Status: AC | PRN
  Administered 2015-03-07: 25.5 via INTRAVENOUS

## 2015-03-07 MED ORDER — BICALUTAMIDE 50 MG PO TABS
50.0000 mg | ORAL_TABLET | Freq: Every day | ORAL | Status: DC
  Administered 2015-03-07 – 2015-03-08 (×2): 50 mg via ORAL
  Filled 2015-03-07 (×2): qty 1

## 2015-03-07 MED ORDER — WARFARIN - PHARMACIST DOSING INPATIENT: Freq: Every day | Status: DC

## 2015-03-07 MED ORDER — POTASSIUM CHLORIDE 20 MEQ/15ML (10%) PO SOLN
40.0000 meq | Freq: Four times a day (QID) | ORAL | Status: AC
  Administered 2015-03-07 (×2): 40 meq via ORAL
  Filled 2015-03-07 (×2): qty 30

## 2015-03-07 MED ORDER — POTASSIUM CHLORIDE CRYS ER 20 MEQ PO TBCR
40.0000 meq | EXTENDED_RELEASE_TABLET | Freq: Four times a day (QID) | ORAL | Status: DC
  Filled 2015-03-07: qty 2

## 2015-03-07 MED ORDER — IOHEXOL 300 MG/ML  SOLN: 25.0000 mL | INTRAMUSCULAR | Status: AC

## 2015-03-07 MED ORDER — IOHEXOL 300 MG/ML  SOLN
100.0000 mL | Freq: Once | INTRAMUSCULAR | Status: AC | PRN
  Administered 2015-03-07: 100 mL via INTRAVENOUS

## 2015-03-07 NOTE — Progress Notes (Signed)
PT Cancellation Note  Patient Details Name: Norman Lee MRN: [id number] DOB: 06/07/1929   Cancelled Treatment:    Reason Eval/Treat Not Completed: Fatigue/lethargy limiting ability to participate  Check on pt this morning however off floor and then back to room and received tray so declined.  Upon checking back this afternoon, pt up in recliner and declined ambulation.  Pt wished to remain in recliner despite reporting feeling tired and offering of assist back to bed.  Will check back as schedule permits.   LEMYRE,KATHrine E 03/07/2015, 1:31 PM Carmelia Bake, PT, DPT 03/07/2015 Pager: [phone]

## 2015-03-07 NOTE — Progress Notes (Addendum)
[facility] PROGRESS NOTE  KIMBERLY COYE UPJ:[id number] DOB: 1929-03-15 DOA: 03/06/2015 PCP: Cletis Athens, MD  Assessment/Plan: 1. Functional decline/failure to thrive. -Patient presenting with 6 week history of increasing weakness, unsteadiness, anorexia, significant weight loss, protein calorie malnutrition. -CT scan of abdomen and pelvis not show evidence for metastatic disease. -He had a bone scan that showed benign fractures involving 5,6,7 ribs radiology feeling this likely reflected benign etiology  -TSH within normal limits at 1.331. -Pending Vit D level -Continue ensure.  -PT consulted  2.  Hypokalemia -Labs showing potassium of 3.1, he was given oral potassium replacement -Repeat CMP in am  3.  Hyperbilirubinemia -CT scan of abdomen and pelvis did not reveal acute intra-abdominal process -A.m. labs showing a downward trend in bilirubin from 3.8-2.4 -Follow-up on repeat labs in a.m.  4.  Unsteady gait. -PT consulted  5. History of prostate cancer. -Imaging studies during this hospitalization did not reveal evidence for metastatic disease. Labs showing elevated PSA -Case discussed with his urologist Dr.Tannenbaum who recommended starting Casodex 50 mg   6.  History of atrial fibrillation. -Pharmacy consulted for Coumadin management. A.m. labs showing INR of 3.06.  7.  Hematuria -Hemoglobin remaining stable, I suspect secondary to Foley catheter trauma, bleed likely precipitated by anticoagulation -Urine has cleared up.  Code Status: Full code Family Communication:  Disposition Plan: Anticipate discharge in the next 24-48 hours   HPI/Subjective: Patient states feeling little better today, in good spirits, awake and alert as though complaints.  Objective: Filed Vitals:   03/07/15 1500  BP: 117/63  Pulse: 73  Temp: 97.5 F (36.4 C)  Resp: 20    Intake/Output Summary (Last 24 hours) at 03/07/15 1822 Last data filed at 03/07/15 1700  Gross per 24  hour  Intake      0 ml  Output   2251 ml  Net  -2251 ml   Filed Weights   03/06/15 1453 03/07/15 0437  Weight: 68.3 kg (150 lb 9.2 oz) 69.627 kg (153 lb 8 oz)    Exam:   General:  Elderly gentleman, no acute distress, he is awake and alert  Cardiovascular: Regular rate and rhythm normal S1-S2 he has 3-5 systolic ejection murmur best heard over the mitral valve  Respiratory: Normal respiratory effort, lungs are clear to auscultation bilaterally  Abdomen: Soft nontender nondistended  Musculoskeletal: No edema   Data Reviewed: Basic Metabolic Panel:  Recent Labs Lab 03/06/15 1516 03/07/15 0440  NA 136 137  K 3.0* 3.1*  CL 96* 98*  CO2 31 32  GLUCOSE 82 103*  BUN 23* 24*  CREATININE 0.93 1.10  CALCIUM 8.9 8.3*   Liver Function Tests:  Recent Labs Lab 03/06/15 1516 03/07/15 0440  AST 30 22  ALT 22 16*  ALKPHOS 112 89  BILITOT 3.4* 2.4*  PROT 7.2 5.6*  ALBUMIN 3.8 3.1*   No results for input(s): LIPASE, AMYLASE in the last 168 hours. No results for input(s): AMMONIA in the last 168 hours. CBC:  Recent Labs Lab 03/06/15 1516 03/07/15 0440  WBC 11.4* 8.9  NEUTROABS 8.3*  --   HGB 14.5 13.1  HCT 42.1 37.9*  MCV 91.7 90.2  PLT 202 191   Cardiac Enzymes: No results for input(s): CKTOTAL, CKMB, CKMBINDEX, TROPONINI in the last 168 hours. BNP (last 3 results) No results for input(s): BNP in the last 8760 hours.  ProBNP (last 3 results) No results for input(s): PROBNP in the last 8760 hours.  CBG: No results for input(s): GLUCAP in  the last 168 hours.  No results found for this or any previous visit (from the past 240 hour(s)).   Studies: Dg Chest 2 View  03/07/2015   CLINICAL DATA:  Atrial fibrillation. History of CAD, hypertension, pulmonary hypertension, dysrhythmia, pacemaker  EXAM: CHEST  2 VIEW  COMPARISON:  Chest radiograph 10/19/2013  FINDINGS: Mild cardiomegaly is stable. Mitral annulus calcification noted. Left subclavian dual lead pacer  stable, with leads projecting over the right atrium and right ventricle. There are small bilateral posteriorly layering pleural effusions. Question slight interstitial edema at the right lung base. Negative for pneumothorax or consolidative airspace disease.  IMPRESSION: 1. Small bilateral pleural effusions. 2. Suspect slight interstitial pulmonary edema, visible at the right lung base laterally. 3. Stable cardiomegaly and dual lead pacemaker.   Electronically Signed   By: Curlene Dolphin M.D.   On: 03/07/2015 09:51   Nm Bone Scan Whole Body  03/07/2015   CLINICAL DATA:  Prostate cancer.  EXAM: NUCLEAR MEDICINE WHOLE BODY BONE SCAN  TECHNIQUE: Whole body anterior and posterior images were obtained approximately 3 hours after intravenous injection of radiopharmaceutical.  RADIOPHARMACEUTICALS:  25.5 mCi Technetium-5m MDP IV  COMPARISON:  Bone scan dated 04/22/2012 and CT scan of the lumbar spine dated 10/05/2014 and abdominal CT scan dated 03/06/2017  FINDINGS: There are focal areas of intense abnormal activity in the right fifth, sixth, and seventh ribs. These most likely represent acute or subacute non pathologic fractures. There subtle callus formation at the anterior aspect of the right seventh rib on the CT scan of 03/07/2015.  There is focal increased activity in the region of the spinous process of T3 or T4.  There is increased activity at the first carpal metacarpal joints of both hands.  No abnormal activity in the pelvic bones or hips. Slight increased activity at the facet joints at L4-5 consistent with previous demonstrated facet arthritis.  IMPRESSION: New focal lesions in the right fifth, sixth and seventh ribs, most likely representing benign fractures.  New focal increased activity of the spinous process of T3 or T4, indeterminate.  CT scan of the chest without contrast could further evaluate all of these lesions have and felt to be indicated clinically. However, I doubt that any of these represent  metastatic disease.   Electronically Signed   By: Lorriane Shire M.D.   On: 03/07/2015 15:05   Ct Abdomen Pelvis W Contrast  03/07/2015   CLINICAL DATA:  Patient with history of weight loss decreased appetite. Hematuria. Failure to thrive. Elevated PSA.  EXAM: CT ABDOMEN AND PELVIS WITH CONTRAST  TECHNIQUE: Multidetector CT imaging of the abdomen and pelvis was performed using the standard protocol following bolus administration of intravenous contrast.  CONTRAST:  131mL OMNIPAQUE IOHEXOL 300 MG/ML  SOLN  COMPARISON:  CT abdomen pelvis 04/22/2012  FINDINGS: Lower chest: Heart is enlarged. There is a moderate right and small left pleural effusion. Dependent ground-glass opacities within the bilateral lower lobes suggestive of atelectasis.  Hepatobiliary: Liver is normal in size and contour. Stable too small to characterize subcapsular low-attenuation lesion (image 22; series 2). Patient status post cholecystectomy. No intrahepatic or extrahepatic biliary ductal dilatation. Multiple calcified granulomas.  Pancreas: Unremarkable  Spleen: Multiple calcified granulomas.  Adrenals/Urinary Tract: Stable thickening of the bilateral adrenal glands. Stable probable small right adrenal adenoma. Kidneys enhance symmetrically with contrast. Partially exophytic cyst off the inferior pole of the right kidney. Urinary bladder is thick walled. Foley catheter is present within the urinary bladder lumen. Central dystrophic calcifications  in the prostate.  Stomach/Bowel: Large amount of stool within the rectum. The appendix is normal. No evidence for bowel obstruction. Small hiatal hernia.  Vascular/Lymphatic: Infrarenal abdominal aortic ectasia measuring 2.6 cm. Peripheral calcified atherosclerotic plaque. No retroperitoneal lymphadenopathy.  Other: Large bowel containing left inguinal hernia. No evidence for obstruction.  Musculoskeletal: Lower lumbar spine degenerative changes. No aggressive or acute appearing osseous lesions.  Marked L4-5 degenerative changes with grade 1 anterolisthesis posterior disc protrusion.  IMPRESSION: Moderate right and small left pleural effusions with underlying ground-glass opacities favored to represent associated atelectasis.  Large amount of stool within the rectum as be seen with constipation.  New large bowel containing left inguinal hernia without evidence for obstruction.  Wall thickening of the urinary bladder which contains Foley catheter. Center correlation with urinalysis to exclude cystitis.   Electronically Signed   By: Lovey Newcomer M.D.   On: 03/07/2015 10:09    Scheduled Meds: . feeding supplement (ENSURE ENLIVE)  237 mL Oral BID BM  . furosemide  40 mg Oral BID  . senna  1 tablet Oral BID  . Warfarin - Pharmacist Dosing Inpatient   Does not apply q1800   Continuous Infusions:   Principal Problem:   Gross hematuria Active Problems:   A-fib   Pacemaker - Medtronic Adapta 2014, leads 2008   Mitral regurgitation, Severe   Prostate cancer   HTN (hypertension)   Pulmonary HTN   Anticoagulated on warfarin   Clot hematuria   Malnutrition of moderate degree    Time spent: 30 minutes    Kelvin Cellar  [facility] Pager [phone]. If 7PM-7AM, please contact night-coverage at [email or web address], password [facility] 03/07/2015, 6:22 PM  LOS: 1 day

## 2015-03-07 NOTE — Progress Notes (Signed)
Urology Progress Note  * No surgery found *   Subjective: 1. Prostate cancer:  PSA 18 18.28, in face of lo testosterone ( thought 2ndry to statin use) of 170.     No acute urologic events overnight. Ambulation:   negative Flatus:    positive Bowel movement  negative  Pain: complete resolution  Objective:  Blood pressure 120/63, pulse 79, temperature 98.3 F (36.8 C), temperature source Oral, resp. rate 18, height 6\' 2"  (1.88 m), weight 69.627 kg (153 lb 8 oz), SpO2 94 %.  Physical Exam:  General:  No acute distress, awake Extremities: extremities normal, atraumatic, no cyanosis or edema Genitourinary:    Foley: remains    I/O last 3 completed shifts: In: 240 [P.O.:240] Out: 1150 [Urine:1150]  Recent Labs     03/06/15  1516  03/07/15  0440  HGB  14.5  13.1  WBC  11.4*  8.9  PLT  202  191    Recent Labs     03/06/15  1516  03/07/15  0440  NA  136  137  K  3.0*  3.1*  CL  96*  98*  CO2  31  32  BUN  23*  24*  CREATININE  0.93  1.10  CALCIUM  8.9  8.3*  GFRNONAA  >60  60*  GFRAA  >60  >60     Recent Labs     03/06/15  1516  INR  3.01*     Invalid input(s): ABG  Assessment/Plan: Castrate resistant CaP, with improvement with medical Rx. It would appear that he developed CHF requiring lasix which overwhelmed his bladder outlet, leading to urinary retention, with traumatic foley catheterization-which, in the face of anticoagulation, led to gross hematuria, and need for larger catheter.    He now has clear urine, and is is medically stable. He is for CT brain, chest, abd, and pelvis; and will begin to have re-start of anticoagulation.   Although he has never been treatd with hormone therapy, he gives the appearance of CRPC, and will have Oncology consult while in house.    P: Oncology consult.

## 2015-03-07 NOTE — Progress Notes (Signed)
ANTICOAGULATION CONSULT NOTE - Initial Consult  Pharmacy Consult for Warfarin Indication: atrial fibrillation  Allergies  Allergen Reactions  . Codeine Nausea And Vomiting    Patient Measurements: Height: 6\' 2"  (188 cm) Weight: 153 lb 8 oz (69.627 kg) IBW/kg (Calculated) : 82.2   Vital Signs: Temp: 98.3 F (36.8 C) (09/20 0437) Temp Source: Oral (09/20 0437) BP: 120/63 mmHg (09/20 0437) Pulse Rate: 79 (09/20 0437)  Labs:  Recent Labs  03/06/15 1516 03/07/15 0440 03/07/15 0900  HGB 14.5 13.1  --   HCT 42.1 37.9*  --   PLT 202 191  --   LABPROT 30.7*  --  31.1*  INR 3.01*  --  3.06*  CREATININE 0.93 1.10  --     Estimated Creatinine Clearance: 49.2 mL/min (by C-G formula based on Cr of 1.1).   Medical History: Past Medical History  Diagnosis Date  . Prostate cancer   . CAD (coronary artery disease)     Circumflex MI 1995, Last cath 2008  . Mitral regurgitation     Severe  . HLD (hyperlipidemia)   . HTN (hypertension)   . Atrial fibrillation   . Pulmonary hypertension   . Presence of permanent cardiac pacemaker 10/16/2006,09/04/12    Medtronic adapta  . Chronic anticoagulation   . Dysrhythmia     ATRIAL FIBRILATION  . Pacemaker     Assessment: Patient is an 81 year old male that presented to ED on 9/19 with gross hematuria following a traumatic foley catherization for urinary retention.  Patient on chronic warfarin therapy for a Fib, taking 5 mg on Tuesdays/Thursdays and 2.5 mg on every other day.  His last dose was 2.5 mg on 9/19 in AM prior to admission.  9/19 >> INR 3.01, hematuria, hgb 14.5 (down from 16.1) 9/20 >> INR 3.06 (supratherapeutic), urine clear, hgb 13.1  Goal of Therapy:  INR 2-3 Monitor platelets by anticoagulation protocol: Yes   Plan:  --Hold warfarin dose on 9/20, with rising INR --Monitor daily INR, CBC, signs of bleeding --Continue SCDs  Viann Fish 03/07/2015,11:01 AM

## 2015-03-07 NOTE — Progress Notes (Signed)
Urology Progress Note  Prostate cancer, psa 18, Now with bone scan today. CT today.   Subjective:     No acute urologic events overnight. Ambulation:   negative. Seen by PT but topo tired to walk. Ataxic.  Flatus:    positive Bowel movement  positive  Pain: some relief  Objective:  Blood pressure 117/63, pulse 73, temperature 97.5 F (36.4 C), temperature source Oral, resp. rate 20, height 6\' 2"  (1.88 m), weight 69.627 kg (153 lb 8 oz), SpO2 100 %.  Physical Exam:  General:  No acute distress, awake Extremities: extremities normal, atraumatic, no cyanosis or edema Genitourinary:  Flat s-p area Foley: hematuria returns with anticoagulation.     I/O last 3 completed shifts: In: 240 [P.O.:240] Out: 1150 [Urine:1150]  Recent Labs     03/06/15  1516  03/07/15  0440  HGB  14.5  13.1  WBC  11.4*  8.9  PLT  202  191    Recent Labs     03/06/15  1516  03/07/15  0440  NA  136  137  K  3.0*  3.1*  CL  96*  98*  CO2  31  32  BUN  23*  24*  CREATININE  0.93  1.10  CALCIUM  8.9  8.3*  GFRNONAA  >60  60*  GFRAA  >60  >60     Recent Labs     03/06/15  1516  03/07/15  0900  INR  3.01*  3.06*    CLINICAL DATA: Patient with history of weight loss decreased appetite. Hematuria. Failure to thrive. Elevated PSA.  EXAM: CT ABDOMEN AND PELVIS WITH CONTRAST  TECHNIQUE: Multidetector CT imaging of the abdomen and pelvis was performed using the standard protocol following bolus administration of intravenous contrast.  CONTRAST: 14mL OMNIPAQUE IOHEXOL 300 MG/ML SOLN  COMPARISON: CT abdomen pelvis 04/22/2012  FINDINGS: Lower chest: Heart is enlarged. There is a moderate right and small left pleural effusion. Dependent ground-glass opacities within the bilateral lower lobes suggestive of atelectasis.  Hepatobiliary: Liver is normal in size and contour. Stable too small to characterize subcapsular low-attenuation lesion (image 22; series 2). Patient status  post cholecystectomy. No intrahepatic or extrahepatic biliary ductal dilatation. Multiple calcified granulomas.  Pancreas: Unremarkable  Spleen: Multiple calcified granulomas.  Adrenals/Urinary Tract: Stable thickening of the bilateral adrenal glands. Stable probable small right adrenal adenoma. Kidneys enhance symmetrically with contrast. Partially exophytic cyst off the inferior pole of the right kidney. Urinary bladder is thick walled. Foley catheter is present within the urinary bladder lumen. Central dystrophic calcifications in the prostate.  Stomach/Bowel: Large amount of stool within the rectum. The appendix is normal. No evidence for bowel obstruction. Small hiatal hernia.  Vascular/Lymphatic: Infrarenal abdominal aortic ectasia measuring 2.6 cm. Peripheral calcified atherosclerotic plaque. No retroperitoneal lymphadenopathy.  Other: Large bowel containing left inguinal hernia. No evidence for obstruction.  Musculoskeletal: Lower lumbar spine degenerative changes. No aggressive or acute appearing osseous lesions. Marked L4-5 degenerative changes with grade 1 anterolisthesis posterior disc protrusion.  IMPRESSION: Moderate right and small left pleural effusions with underlying ground-glass opacities favored to represent associated atelectasis.  Large amount of stool within the rectum as be seen with constipation.  New large bowel containing left inguinal hernia without evidence for obstruction.  Wall thickening of the urinary bladder which contains Foley catheter. Center correlation with urinalysis to exclude cystitis.   Electronically Signed  By: Lovey Newcomer M.D.  On: 03/07/2015 10:09 CLINICAL DATA: Prostate cancer.  EXAM: NUCLEAR MEDICINE WHOLE  BODY BONE SCAN  TECHNIQUE: Whole body anterior and posterior images were obtained approximately 3 hours after intravenous injection of radiopharmaceutical.  RADIOPHARMACEUTICALS: 25.5 mCi  Technetium-37m MDP IV  COMPARISON: Bone scan dated 04/22/2012 and CT scan of the lumbar spine dated 10/05/2014 and abdominal CT scan dated 03/06/2017  FINDINGS: There are focal areas of intense abnormal activity in the right fifth, sixth, and seventh ribs. These most likely represent acute or subacute non pathologic fractures. There subtle callus formation at the anterior aspect of the right seventh rib on the CT scan of 03/07/2015.  There is focal increased activity in the region of the spinous process of T3 or T4.  There is increased activity at the first carpal metacarpal joints of both hands.  No abnormal activity in the pelvic bones or hips. Slight increased activity at the facet joints at L4-5 consistent with previous demonstrated facet arthritis.  IMPRESSION: New focal lesions in the right fifth, sixth and seventh ribs, most likely representing benign fractures.  New focal increased activity of the spinous process of T3 or T4, indeterminate.  CT scan of the chest without contrast could further evaluate all of these lesions have and felt to be indicated clinically. However, I doubt that any of these represent metastatic disease.   Electronically Signed  By: Lorriane Shire M.D. Assessment/Plan:  Catheter not removed. Will Rx casodex 50mg  even though radiology not convinced of evideence for metastatic disease.. May increase. To 150mg  in future.

## 2015-03-07 NOTE — Progress Notes (Signed)
Initial Nutrition Assessment  DOCUMENTATION CODES:   Non-severe (moderate) malnutrition in context of chronic illness  INTERVENTION:  - Continue Ensure Enlive po BID, each supplement provides 350 kcal and 20 grams of protein - Encourage PO intakes at meals - RD will continue to monitor for needs  NUTRITION DIAGNOSIS:   Unintentional weight loss related to poor appetite as evidenced by per patient/family report, percent weight loss.  GOAL:   Patient will meet greater than or equal to 90% of their needs  MONITOR:   PO intake, Supplement acceptance, Weight trends, Labs, Skin, I & O's  REASON FOR ASSESSMENT:   Malnutrition Screening Tool  ASSESSMENT:   Functional decline/failure to thrive. 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 He has a cardiac history which includes severe mitral regurgitation. Symptoms could be related to progression of cardiac disease. Other possibilities include malignancy particularly with his history of prostate cancer. Case was discussed with Dr.Tannenbaum as patient be further worked up with a CT scan of abdomen and pelvis, bone scan, and MRI of the brain to assess for the possibility of metastatic disease.  Pt seen for MST. BMI indicates normal weight status. Pt ate 50-75% of breakfast this AM which consisted of coffee, toast, an orange, sausage, and breakfast potatoes. Pt reports he had also ordered eggs which were not delivered; pt declined RD's offer to call and re-order eggs for him.  He states that he does not have abdominal pain or nausea at this time and denies chewing or swallowing difficulty.  Chart review indicates poor appetite and weakness PTA but pt states that his appetite was good PTA and he was eating very well. Notes indicate some intermittent confusion. No one at bedside to  assist pt with answering questions.  He states that he would variably drink Ensure PTA; noted that order has been placed for Ensure Enlive BID.   Moderate to severe muscle wasting and mild fat wasting noted. Pt reports weight has been stable. Per chart review, he has lost 8% body weight in 6 months which is not significant for time frame.  Unsure if pt is meeting needs currently. Needs may need to be adjusted if metastatic disease discovered. Medications reviewed. Labs reviewed; K: 3.1 mmol/L, Cl: 98 mmol/L, BUN elevated, Ca: 8.3 mg/dL.   Diet Order:  Diet regular Room service appropriate?: Yes; Fluid consistency:: Thin  Skin:  Wound (see comment) (MSAD to R buttocks)  Last BM:  9/18  Height:   Ht Readings from Last 1 Encounters:  03/06/15 6\' 2"  (1.88 m)    Weight:   Wt Readings from Last 1 Encounters:  03/07/15 153 lb 8 oz (69.627 kg)    Ideal Body Weight:  86.36 kg (kg)  BMI:  Body mass index is 19.7 kg/(m^2).  Estimated Nutritional Needs:   Kcal:  1400-1600  Protein:  60-70 grams  Fluid:  2-2.2 L/day  EDUCATION NEEDS:   No education needs identified at this time     Jarome Matin, RD, LDN Inpatient Clinical Dietitian Pager # [phone] After hours/weekend pager # [phone]

## 2015-03-08 DIAGNOSIS — I4891 Unspecified atrial fibrillation: Secondary | ICD-10-CM | POA: Diagnosis present

## 2015-03-08 DIAGNOSIS — R627 Adult failure to thrive: Secondary | ICD-10-CM

## 2015-03-08 DIAGNOSIS — R310 Gross hematuria: Secondary | ICD-10-CM | POA: Diagnosis not present

## 2015-03-08 LAB — CBC
HCT: 38.7 % — ABNORMAL LOW (ref 39.0–52.0)
Hemoglobin: 13.5 g/dL (ref 13.0–17.0)
MCH: 31.6 pg (ref 26.0–34.0)
MCHC: 34.9 g/dL (ref 30.0–36.0)
MCV: 90.6 fL (ref 78.0–100.0)
PLATELETS: 192 10*3/uL (ref 150–400)
RBC: 4.27 MIL/uL (ref 4.22–5.81)
RDW: 14.7 % (ref 11.5–15.5)
WBC: 10.1 10*3/uL (ref 4.0–10.5)

## 2015-03-08 LAB — COMPREHENSIVE METABOLIC PANEL
ALK PHOS: 95 U/L (ref 38–126)
ALT: 20 U/L (ref 17–63)
ANION GAP: 8 (ref 5–15)
AST: 25 U/L (ref 15–41)
Albumin: 3.4 g/dL — ABNORMAL LOW (ref 3.5–5.0)
BUN: 22 mg/dL — ABNORMAL HIGH (ref 6–20)
CALCIUM: 8.9 mg/dL (ref 8.9–10.3)
CHLORIDE: 98 mmol/L — AB (ref 101–111)
CO2: 30 mmol/L (ref 22–32)
Creatinine, Ser: 1.16 mg/dL (ref 0.61–1.24)
GFR calc non Af Amer: 56 mL/min — ABNORMAL LOW (ref >60)
Glucose, Bld: 102 mg/dL — ABNORMAL HIGH (ref 65–99)
POTASSIUM: 3.9 mmol/L (ref 3.5–5.1)
SODIUM: 136 mmol/L (ref 135–145)
Total Bilirubin: 2.8 mg/dL — ABNORMAL HIGH (ref 0.3–1.2)
Total Protein: 5.9 g/dL — ABNORMAL LOW (ref 6.5–8.1)

## 2015-03-08 LAB — PROTIME-INR
INR: 1.89 — AB (ref 0.00–1.49)
PROTHROMBIN TIME: 21.7 s — AB (ref 11.6–15.2)

## 2015-03-08 MED ORDER — TAMSULOSIN HCL 0.4 MG PO CAPS: 0.4000 mg | ORAL_CAPSULE | Freq: Every day | ORAL | Status: DC

## 2015-03-08 MED ORDER — ZOLPIDEM TARTRATE 5 MG PO TABS: 5.0000 mg | ORAL_TABLET | Freq: Once | ORAL | Status: DC

## 2015-03-08 MED ORDER — ENSURE ENLIVE PO LIQD: 237.0000 mL | Freq: Two times a day (BID) | ORAL | Status: AC

## 2015-03-08 MED ORDER — WARFARIN SODIUM 5 MG PO TABS: ORAL_TABLET | ORAL | Status: DC

## 2015-03-08 MED ORDER — BICALUTAMIDE 50 MG PO TABS: 50.0000 mg | ORAL_TABLET | Freq: Every day | ORAL | Status: DC

## 2015-03-08 MED ORDER — WARFARIN SODIUM 5 MG PO TABS
5.0000 mg | ORAL_TABLET | Freq: Once | ORAL | Status: AC
  Administered 2015-03-08: 5 mg via ORAL
  Filled 2015-03-08: qty 1

## 2015-03-08 MED ORDER — MELOXICAM 15 MG PO TABS: 15.0000 mg | ORAL_TABLET | Freq: Every day | ORAL | Status: DC

## 2015-03-08 NOTE — Evaluation (Signed)
Physical Therapy Evaluation Patient Details Name: Norman Lee MRN: [id number] DOB: [birth year] Today's Date: 03/08/2015   History of Present Illness  81 year old gentleman with a past medical history of severe mitral insufficiency, chronic atrial fibrillation on chronic anticoagulation with Coumadin, status post dual-chamber pacemaker implant, hypertension, coronary artery disease with his last cardiac catheterization performed in 2008 and admitted for functional decline/FTT/ hematuria  Clinical Impression  Pt admitted with above diagnosis. Pt currently with functional limitations due to the deficits listed below (see PT Problem List).  Pt appears steady using RW however reports only using RW 50% of the time at home.  Pt has had 3 recent falls.  Pt agreeable to use RW all the time at home upon d/c.  Pt reports fatigue and presents with generalized weakness and limited endurance.  Pt will benefit from skilled PT to increase their independence and safety with mobility to allow discharge to the venue listed below.       Follow Up Recommendations Home health PT;Supervision for mobility/OOB    Equipment Recommendations  None recommended by PT    Recommendations for Other Services       Precautions / Restrictions Precautions Precautions: Fall Restrictions Weight Bearing Restrictions: No      Mobility  Bed Mobility Overal bed mobility: Needs Assistance Bed Mobility: Supine to Sit     Supine to sit: Supervision;HOB elevated        Transfers Overall transfer level: Needs assistance Equipment used: Rolling walker (2 wheeled) Transfers: Sit to/from Stand Sit to Stand: Min guard            Ambulation/Gait Ambulation/Gait assistance: Min guard Ambulation Distance (Feet): 200 Feet Assistive device: Rolling walker (2 wheeled) Gait Pattern/deviations: Step-through pattern;Decreased stride length;Trunk flexed     General Gait Details: gait appears steady with RW,  encouraged pt to use all the time once discharged for safety  Stairs            Wheelchair Mobility    Modified Rankin (Stroke Patients Only)       Balance Overall balance assessment: History of Falls (pt reports 3 recent falls)                                           Pertinent Vitals/Pain Pain Assessment: No/denies pain    Home Living Family/patient expects to be discharged to:: Private residence Living Arrangements: Spouse/significant other   Type of Home: House Home Access: Stairs to enter   Technical brewer of Steps: 1 Home Layout: Able to live on main level with bedroom/bathroom Home Equipment: Walker - 2 wheels      Prior Function Level of Independence: Independent with assistive device(s)         Comments: uses RW 50% of the time per pt     Hand Dominance        Extremity/Trunk Assessment               Lower Extremity Assessment: Generalized weakness      Cervical / Trunk Assessment: Other exceptions  Communication   Communication: HOH  Cognition Arousal/Alertness: Awake/alert Behavior During Therapy: WFL for tasks assessed/performed Overall Cognitive Status: Within Functional Limits for tasks assessed                      General Comments      Exercises  Assessment/Plan    PT Assessment Patient needs continued PT services  PT Diagnosis Generalized weakness   PT Problem List Decreased strength;Decreased activity tolerance;Decreased mobility;Decreased knowledge of use of DME;Decreased balance  PT Treatment Interventions DME instruction;Gait training;Functional mobility training;Patient/family education;Therapeutic activities;Therapeutic exercise;Balance training   PT Goals (Current goals can be found in the Care Plan section) Acute Rehab PT Goals PT Goal Formulation: With patient Time For Goal Achievement: 03/15/15 Potential to Achieve Goals: Good    Frequency Min 3X/week    Barriers to discharge        Co-evaluation               End of Session Equipment Utilized During Treatment: Gait belt Activity Tolerance: Patient tolerated treatment well Patient left: in chair;with call bell/phone within reach;with chair alarm set Nurse Communication: Mobility status         Time: 5621-3086 PT Time Calculation (min) (ACUTE ONLY): 14 min   Charges:   PT Evaluation $Initial PT Evaluation Tier I: 1 Procedure     PT G Codes:        LEMYRE,KATHrine E 03/08/2015, 10:30 AM Carmelia Bake, PT, DPT 03/08/2015 Pager: [phone]

## 2015-03-08 NOTE — Discharge Summary (Signed)
Physician Discharge Summary  Patient ID: Norman Lee MRN: [id number] DOB/AGE: 1928/09/04 81 y.o.  Admit date: 03/06/2015 Discharge date: 03/08/2015  Admission Diagnoses: A fib Gross Hematuria Prostate cancer Discharge Diagnoses:  Principal Problem:   Gross hematuria Active Problems:   Pacemaker - Medtronic Adapta 2014, leads 2008   Mitral regurgitation, Severe   Prostate cancer   HTN (hypertension)   Pulmonary HTN   A-fib   Anticoagulated on warfarin   Clot hematuria   Malnutrition of moderate degree   Discharged Condition: stable  Hospital Course:  Improved wit4h IV fluids, PT stabilization, bladder drainage. Cancer evaluation.   Significant Diagnostic Studies: Dg Chest 2 View  03/07/2015   CLINICAL DATA:  Atrial fibrillation. History of CAD, hypertension, pulmonary hypertension, dysrhythmia, pacemaker  EXAM: CHEST  2 VIEW  COMPARISON:  Chest radiograph 10/19/2013  FINDINGS: Mild cardiomegaly is stable. Mitral annulus calcification noted. Left subclavian dual lead pacer stable, with leads projecting over the right atrium and right ventricle. There are small bilateral posteriorly layering pleural effusions. Question slight interstitial edema at the right lung base. Negative for pneumothorax or consolidative airspace disease.  IMPRESSION: 1. Small bilateral pleural effusions. 2. Suspect slight interstitial pulmonary edema, visible at the right lung base laterally. 3. Stable cardiomegaly and dual lead pacemaker.   Electronically Signed   By: Curlene Dolphin M.D.   On: 03/07/2015 09:51   Nm Bone Scan Whole Body  03/07/2015   CLINICAL DATA:  Prostate cancer.  EXAM: NUCLEAR MEDICINE WHOLE BODY BONE SCAN  TECHNIQUE: Whole body anterior and posterior images were obtained approximately 3 hours after intravenous injection of radiopharmaceutical.  RADIOPHARMACEUTICALS:  25.5 mCi Technetium-2m MDP IV  COMPARISON:  Bone scan dated 04/22/2012 and CT scan of the lumbar spine dated 10/05/2014  and abdominal CT scan dated 03/06/2017  FINDINGS: There are focal areas of intense abnormal activity in the right fifth, sixth, and seventh ribs. These most likely represent acute or subacute non pathologic fractures. There subtle callus formation at the anterior aspect of the right seventh rib on the CT scan of 03/07/2015.  There is focal increased activity in the region of the spinous process of T3 or T4.  There is increased activity at the first carpal metacarpal joints of both hands.  No abnormal activity in the pelvic bones or hips. Slight increased activity at the facet joints at L4-5 consistent with previous demonstrated facet arthritis.  IMPRESSION: New focal lesions in the right fifth, sixth and seventh ribs, most likely representing benign fractures.  New focal increased activity of the spinous process of T3 or T4, indeterminate.  CT scan of the chest without contrast could further evaluate all of these lesions have and felt to be indicated clinically. However, I doubt that any of these represent metastatic disease.   Electronically Signed   By: Lorriane Shire M.D.   On: 03/07/2015 15:05   Ct Abdomen Pelvis W Contrast  03/07/2015   CLINICAL DATA:  Patient with history of weight loss decreased appetite. Hematuria. Failure to thrive. Elevated PSA.  EXAM: CT ABDOMEN AND PELVIS WITH CONTRAST  TECHNIQUE: Multidetector CT imaging of the abdomen and pelvis was performed using the standard protocol following bolus administration of intravenous contrast.  CONTRAST:  134mL OMNIPAQUE IOHEXOL 300 MG/ML  SOLN  COMPARISON:  CT abdomen pelvis 04/22/2012  FINDINGS: Lower chest: Heart is enlarged. There is a moderate right and small left pleural effusion. Dependent ground-glass opacities within the bilateral lower lobes suggestive of atelectasis.  Hepatobiliary: Liver is normal  in size and contour. Stable too small to characterize subcapsular low-attenuation lesion (image 22; series 2). Patient status post  cholecystectomy. No intrahepatic or extrahepatic biliary ductal dilatation. Multiple calcified granulomas.  Pancreas: Unremarkable  Spleen: Multiple calcified granulomas.  Adrenals/Urinary Tract: Stable thickening of the bilateral adrenal glands. Stable probable small right adrenal adenoma. Kidneys enhance symmetrically with contrast. Partially exophytic cyst off the inferior pole of the right kidney. Urinary bladder is thick walled. Foley catheter is present within the urinary bladder lumen. Central dystrophic calcifications in the prostate.  Stomach/Bowel: Large amount of stool within the rectum. The appendix is normal. No evidence for bowel obstruction. Small hiatal hernia.  Vascular/Lymphatic: Infrarenal abdominal aortic ectasia measuring 2.6 cm. Peripheral calcified atherosclerotic plaque. No retroperitoneal lymphadenopathy.  Other: Large bowel containing left inguinal hernia. No evidence for obstruction.  Musculoskeletal: Lower lumbar spine degenerative changes. No aggressive or acute appearing osseous lesions. Marked L4-5 degenerative changes with grade 1 anterolisthesis posterior disc protrusion.  IMPRESSION: Moderate right and small left pleural effusions with underlying ground-glass opacities favored to represent associated atelectasis.  Large amount of stool within the rectum as be seen with constipation.  New large bowel containing left inguinal hernia without evidence for obstruction.  Wall thickening of the urinary bladder which contains Foley catheter. Center correlation with urinalysis to exclude cystitis.   Electronically Signed   By: Lovey Newcomer M.D.   On: 03/07/2015 10:09    Discharge Exam: Blood pressure 106/63, pulse 70, temperature 98 F (36.7 C), temperature source Oral, resp. rate 20, height 6\' 2"  (1.88 m), weight 69.4 kg (153 lb), SpO2 95 %.   Disposition: 01-Home or Self Care  Discharge Instructions    Admit to Inpatient (patient's expected length of stay will be greater than 2  midnights)    Complete by:  As directed   Hospital Area:  [facility]  Diagnosis:  Clot hematuria  Level of Care:  Med-Surg  Admitting Physician:  Carolan Clines  Estimated length of stay:  past midnight tomorrow  Certification:  I certify this patient will need inpatient services for at least 2 midnights  Attending Physician:  Carolan Clines  Bed request comments:  monitored     Discharge patient    Complete by:  As directed      Discontinue IV    Complete by:  As directed      Foley catheter - discontinue    Complete by:  As directed             Medication List    TAKE these medications        acetaminophen 500 MG tablet  Commonly known as:  TYLENOL  Take 500-1,000 mg by mouth every 6 (six) hours as needed for moderate pain.     aspirin 81 MG tablet  Take 81 mg by mouth daily.     bicalutamide 50 MG tablet  Commonly known as:  CASODEX  Take 1 tablet (50 mg total) by mouth daily.     feeding supplement (ENSURE ENLIVE) Liqd  Take 237 mLs by mouth 2 (two) times daily between meals.     furosemide 40 MG tablet  Commonly known as:  LASIX  Take 1 tablet twice a day as needed for swelling.     hydrocortisone cream 1 %  Apply 1 application topically daily as needed for itching (psoriasis).     meloxicam 15 MG tablet  Commonly known as:  MOBIC  Take 1 tablet (15 mg total) by mouth  daily.     metolazone 2.5 MG tablet  Commonly known as:  ZAROXOLYN  Take 1 tablet by mouth 3 times a week on Monday, Wednesday, Friday     ramipril 10 MG capsule  Commonly known as:  ALTACE  TAKE 1 CAPSULE (10 MG TOTAL) BY MOUTH DAILY.     simvastatin 20 MG tablet  Commonly known as:  ZOCOR  TAKE 1 TABLET (20 MG TOTAL) BY MOUTH AT BEDTIME.     tamsulosin 0.4 MG Caps capsule  Commonly known as:  FLOMAX  Take 1 capsule (0.4 mg total) by mouth daily.     warfarin 5 MG tablet  Commonly known as:  COUMADIN  Take 2.5-5 mg by mouth See admin instructions.  Take 1 tablet on Tuesday and Friday then take 1/2 tablet all the other days     warfarin 5 MG tablet  Commonly known as:  COUMADIN  Take 1,2 dose of warfarin, and adjust according to INR.           Follow-up Information    Schedule an appointment as soon as possible for a visit with Ailene Rud, MD.   Specialty:  Urology   Contact information:   [address] [place] [address] [phone]     casodex Coumadin  ( 1/2 dose)  Signed: Shanan Mcmiller I Alaira Level 03/08/2015, 9:04 AM

## 2015-03-08 NOTE — Progress Notes (Signed)
ANTICOAGULATION CONSULT NOTE - Initial Consult  Pharmacy Consult for Warfarin Indication: atrial fibrillation  Allergies  Allergen Reactions  . Codeine Nausea And Vomiting    Patient Measurements: Height: 6\' 2"  (188 cm) Weight: 153 lb (69.4 kg) IBW/kg (Calculated) : 82.2   Vital Signs: Temp: 98 F (36.7 C) (09/21 0445) Temp Source: Oral (09/21 0445) BP: 106/63 mmHg (09/21 0445) Pulse Rate: 70 (09/21 0445)  Labs:  Recent Labs  03/06/15 1516 03/07/15 0440 03/07/15 0900 03/08/15 0508  HGB 14.5 13.1  --  13.5  HCT 42.1 37.9*  --  38.7*  PLT 202 191  --  192  LABPROT 30.7*  --  31.1* 21.7*  INR 3.01*  --  3.06* 1.89*  CREATININE 0.93 1.10  --  1.16    Estimated Creatinine Clearance: 46.5 mL/min (by C-G formula based on Cr of 1.16).   Medical History: Past Medical History  Diagnosis Date  . Prostate cancer   . CAD (coronary artery disease)     Circumflex MI 1995, Last cath 2008  . Mitral regurgitation     Severe  . HLD (hyperlipidemia)   . HTN (hypertension)   . Atrial fibrillation   . Pulmonary hypertension   . Presence of permanent cardiac pacemaker 10/16/2006,09/04/12    Medtronic adapta  . Chronic anticoagulation   . Dysrhythmia     ATRIAL FIBRILATION  . Pacemaker     Assessment: Patient is an 81 year old male that presented to ED on 9/19 with gross hematuria following a traumatic foley catherization for urinary retention.  Patient on chronic warfarin therapy for a Fib, taking 5 mg on Tuesdays/Thursdays and 2.5 mg on every other day.  His last dose was 2.5 mg on 9/19 in AM prior to admission.  9/19 >> INR 3.01, hematuria 9/20 >> INR 3.06  9/21 >> INR 1.89 (subtherapeutic), urine clear, Hgb 13.5  Goal of Therapy:  INR 2-3 Monitor platelets by anticoagulation protocol: Yes   Plan:  --Give Warfarin 5 mg on 9/21 --Monitor daily INR, CBC, signs of bleeding   Viann Fish 03/08/2015,7:57 AM

## 2015-03-08 NOTE — Care Management Important Message (Signed)
Important Message  Patient Details  Name: Norman Lee MRN: [id number] Date of Birth: 28-Sep-1928   Medicare Important Message Given:  Yes-second notification given    Camillo Flaming 03/08/2015, 11:49 AMImportant Message  Patient Details  Name: Norman Lee MRN: [id number] Date of Birth: September 14, 1928   Medicare Important Message Given:  Yes-second notification given    Camillo Flaming 03/08/2015, 11:49 AM

## 2015-03-08 NOTE — Progress Notes (Signed)
Urology Progress Note     Subjective: 1. Prostate cancer, psa 18, + bone scan; but Radiology not convinced of pathologic fractures. Pt started on casodes, 50mg , with Pharmacy warning to reduce dose of warfarin by 50% 2ndary to drug interaction with casodex. I have discussed this with pharmacy ( not seen in [place], but found in Pharmacy computer).  Pt needs PT this AM for balance evaluation and strengthening; and may have foley removed: urine clear.     No acute urologic events overnight. Ambulation:   negative Flatus:    positive Bowel movement  negative  Pain: complete resolution  Objective:  Blood pressure 106/63, pulse 70, temperature 98 F (36.7 C), temperature source Oral, resp. rate 20, height 6\' 2"  (1.88 m), weight 69.4 kg (153 lb), SpO2 95 %.  Physical Exam:  General:  No acute distress, awake Resp: clear to auscultation bilaterally Genitourinary:  Neg BIS Foley: to be removed    I/O last 3 completed shifts: In: 240 [P.O.:240] Out: 3876 [Urine:3875; Stool:1]  Recent Labs     03/07/15  0440  03/08/15  0508  HGB  13.1  13.5  WBC  8.9  10.1  PLT  191  192    Recent Labs     03/07/15  0440  03/08/15  0508  NA  137  136  K  3.1*  3.9  CL  98*  98*  CO2  32  30  BUN  24*  22*  CREATININE  1.10  1.16  CALCIUM  8.3*  8.9  GFRNONAA  60*  56*  GFRAA  >60  >60     Recent Labs     03/07/15  0900  03/08/15  0508  INR  3.06*  1.89*     Invalid input(s): ABG  Assessment/Plan:  Catheter removed. D/c today. Pt will 1/2 warfarin dose. Casodex 50mg /day.

## 2015-03-08 NOTE — Discharge Instructions (Signed)
I have reviewed discharge instructions in detail with the patient. They will follow-up with me or their physician as scheduled. My nurse will also be calling the patients as per protocol.   

## 2015-03-08 NOTE — Progress Notes (Signed)
Pt selected [facility] for HHPT.

## 2015-03-08 NOTE — Progress Notes (Signed)
[facility] PROGRESS NOTE  Norman Lee JOA:[id number] DOB: 09-25-1928 DOA: 03/06/2015 PCP: Norman [place], MD   Brief narrative Norman Lee is a pleasant 81 year old gentleman with a past medical history of severe mitral insufficiency, chronic atrial fibrillation on chronic anticoagulation with Coumadin, status post dual-chamber pacemaker implant, hypertension, coronary artery disease with his last cardiac catheterization performed in 2008, presenting as a direct admission from Dr Norman Lee office. He was recently seen in the emergency department on 02/25/2015 at which time he presented with complaints of decreased urine output. He was found to have urinary retention with greater than 900 mL on bladder scan. A Foley catheter was placed during that visit and was instructed to follow-up with his urologist. His wife reports that Norman Lee has had a functional decline over the past 6 weeks characterized by gait imbalance, generalized weakness, excessive daytime sleepiness, anorexia and significant weight loss. Symptoms worsening over the past 2 weeks. 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 He denies fevers, chills, nausea, vomiting, diarrhea, constipation, bloody stools, abdominal pain. He has a Foley catheter in place with pink colored urine.    Assessment/Plan: 1. Functional decline/failure to thrive. - Patient presented with 6 week history of increasing weakness, unsteadiness, anorexia, significant weight loss, protein calorie malnutrition. - CT scan of abdomen and pelvis did not show evidence for metastatic disease. - He had a bone scan that showed benign fractures involving 5,6,7 ribs> radiology felt this likely reflected benign etiology . New focal increased activity at spinous process of T3 and T4 of indeterminate significance-radiology recommends CT-defer to urology/PCP for further evaluation as deemed necessary. - TSH within normal limits  at 1.331. - Pending Vit D level > OP follow up with PCP - Continue ensure.  - PT consulted and recommend home health PT - B-12: 418 & folate 14.6  2.  Hypokalemia - Replaced  3.  Hyperbilirubinemia -CT scan of abdomen and pelvis did not reveal acute intra-abdominal process - A.m. labs showing a downward trend in bilirubin from 3.8-2.4 - Unclear etiology. No GI symptoms reported. Outpatient follow-up as deemed necessary.  4.  Unsteady gait. -PT consulted and recommend home health PT.  5. History of prostate cancer. -Imaging studies during this hospitalization did not reveal evidence for metastatic disease. Labs showing elevated PSA - Management per urology  6.  History of atrial fibrillation. - INR 1.89 . 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 He has been advised by Urology that Casodex may increase INR and he has to adjust the dose. - CHADSVasc score of 5 - Patient is status post pacemaker implantation.  7.  Hematuria -Hemoglobin remaining stable, I suspect secondary to Foley catheter trauma, bleed likely precipitated by anticoagulation -Urine has cleared up significantly. Foley discontinued by urology.  Constipation - Bowel regimen discussed with patient.  Essential hypertension - Controlled   Code Status: Full code Family Communication:  Disposition Plan: Plans for discharge by urology today.   HPI/Subjective: Patient seen this morning. Denied complaints. Hungry for breakfast.  Objective:  Filed Vitals:   03/07/15 0437 03/07/15 1500 03/07/15 2034 03/08/15 0445  BP: 120/63 117/63 113/72 106/63  Pulse: 79 73 73 70  Temp: 98.3 F (36.8 C) 97.5 F (36.4 C) 97.9 F (36.6 C) 98 F (36.7 C)  TempSrc: Oral Oral Oral Oral  Resp: 18 20 20 20   Height:      Weight: 69.627 kg (153 lb 8 oz)   69.4 kg (153 lb)  SpO2: 94% 100% 99% 95%     Intake/Output Summary (Last 24 hours) at 03/08/15 1553 Last data filed at 03/08/15 0452  Gross per 24 hour   Intake    240 ml  Output   2225 ml  Net  -1985 ml   Filed Weights   03/06/15 1453 03/07/15 0437 03/08/15 0445  Weight: 68.3 kg (150 lb 9.2 oz) 69.627 kg (153 lb 8 oz) 69.4 kg (153 lb)    Exam:   General:  Elderly gentleman, no acute distress, he is awake and alert  Cardiovascular: Regular rate and rhythm normal S1-S2 he has 3-5 systolic ejection murmur best heard over the mitral valve  Respiratory: Normal respiratory effort, lungs are clear to auscultation bilaterally  Abdomen: Soft nontender nondistended  Musculoskeletal: No edema   Data Reviewed: Basic Metabolic Panel:  Recent Labs Lab 03/06/15 1516 03/07/15 0440 03/08/15 0508  NA 136 137 136  K 3.0* 3.1* 3.9  CL 96* 98* 98*  CO2 31 32 30  GLUCOSE 82 103* 102*  BUN 23* 24* 22*  CREATININE 0.93 1.10 1.16  CALCIUM 8.9 8.3* 8.9   Liver Function Tests:  Recent Labs Lab 03/06/15 1516 03/07/15 0440 03/08/15 0508  AST 30 22 25   ALT 22 16* 20  ALKPHOS 112 89 95  BILITOT 3.4* 2.4* 2.8*  PROT 7.2 5.6* 5.9*  ALBUMIN 3.8 3.1* 3.4*   No results for input(s): LIPASE, AMYLASE in the last 168 hours. No results for input(s): AMMONIA in the last 168 hours. CBC:  Recent Labs Lab 03/06/15 1516 03/07/15 0440 03/08/15 0508  WBC 11.4* 8.9 10.1  NEUTROABS 8.3*  --   --   HGB 14.5 13.1 13.5  HCT 42.1 37.9* 38.7*  MCV 91.7 90.2 90.6  PLT 202 191 192   Cardiac Enzymes: No results for input(s): CKTOTAL, CKMB, CKMBINDEX, TROPONINI in the last 168 hours. BNP (last 3 results) No results for input(s): BNP in the last 8760 hours.  ProBNP (last 3 results) No results for input(s): PROBNP in the last 8760 hours.  CBG: No results for input(s): GLUCAP in the last 168 hours.  No results found for this or any previous visit (from the past 240 hour(s)).   Studies: Dg Chest 2 View  03/07/2015   CLINICAL DATA:  Atrial fibrillation. History of CAD, hypertension, pulmonary hypertension, dysrhythmia, pacemaker  EXAM: CHEST   2 VIEW  COMPARISON:  Chest radiograph 10/19/2013  FINDINGS: Mild cardiomegaly is stable. Mitral annulus calcification noted. Left subclavian dual lead pacer stable, with leads projecting over the right atrium and right ventricle. There are small bilateral posteriorly layering pleural effusions. Question slight interstitial edema at the right lung base. Negative for pneumothorax or consolidative airspace disease.  IMPRESSION: 1. Small bilateral pleural effusions. 2. Suspect slight interstitial pulmonary edema, visible at the right lung base laterally. 3. Stable cardiomegaly and dual lead pacemaker.   Electronically Signed   By: Curlene Dolphin M.D.   On: 03/07/2015 09:51   Nm Bone Scan Whole Body  03/07/2015   CLINICAL DATA:  Prostate cancer.  EXAM: NUCLEAR MEDICINE WHOLE BODY BONE SCAN  TECHNIQUE: Whole body anterior and posterior images were obtained approximately 3 hours after intravenous injection of radiopharmaceutical.  RADIOPHARMACEUTICALS:  25.5 mCi Technetium-25m MDP IV  COMPARISON:  Bone scan dated 04/22/2012 and CT scan of the lumbar spine dated 10/05/2014 and abdominal CT scan dated 03/06/2017  FINDINGS: There are focal areas of intense abnormal activity in the right fifth, sixth, and seventh ribs. These most  likely represent acute or subacute non pathologic fractures. There subtle callus formation at the anterior aspect of the right seventh rib on the CT scan of 03/07/2015.  There is focal increased activity in the region of the spinous process of T3 or T4.  There is increased activity at the first carpal metacarpal joints of both hands.  No abnormal activity in the pelvic bones or hips. Slight increased activity at the facet joints at L4-5 consistent with previous demonstrated facet arthritis.  IMPRESSION: New focal lesions in the right fifth, sixth and seventh ribs, most likely representing benign fractures.  New focal increased activity of the spinous process of T3 or T4, indeterminate.  CT scan of  the chest without contrast could further evaluate all of these lesions have and felt to be indicated clinically. However, I doubt that any of these represent metastatic disease.   Electronically Signed   By: Lorriane Shire M.D.   On: 03/07/2015 15:05   Ct Abdomen Pelvis W Contrast  03/07/2015   CLINICAL DATA:  Patient with history of weight loss decreased appetite. Hematuria. Failure to thrive. Elevated PSA.  EXAM: CT ABDOMEN AND PELVIS WITH CONTRAST  TECHNIQUE: Multidetector CT imaging of the abdomen and pelvis was performed using the standard protocol following bolus administration of intravenous contrast.  CONTRAST:  13mL OMNIPAQUE IOHEXOL 300 MG/ML  SOLN  COMPARISON:  CT abdomen pelvis 04/22/2012  FINDINGS: Lower chest: Heart is enlarged. There is a moderate right and small left pleural effusion. Dependent ground-glass opacities within the bilateral lower lobes suggestive of atelectasis.  Hepatobiliary: Liver is normal in size and contour. Stable too small to characterize subcapsular low-attenuation lesion (image 22; series 2). Patient status post cholecystectomy. No intrahepatic or extrahepatic biliary ductal dilatation. Multiple calcified granulomas.  Pancreas: Unremarkable  Spleen: Multiple calcified granulomas.  Adrenals/Urinary Tract: Stable thickening of the bilateral adrenal glands. Stable probable small right adrenal adenoma. Kidneys enhance symmetrically with contrast. Partially exophytic cyst off the inferior pole of the right kidney. Urinary bladder is thick walled. Foley catheter is present within the urinary bladder lumen. Central dystrophic calcifications in the prostate.  Stomach/Bowel: Large amount of stool within the rectum. The appendix is normal. No evidence for bowel obstruction. Small hiatal hernia.  Vascular/Lymphatic: Infrarenal abdominal aortic ectasia measuring 2.6 cm. Peripheral calcified atherosclerotic plaque. No retroperitoneal lymphadenopathy.  Other: Large bowel containing left  inguinal hernia. No evidence for obstruction.  Musculoskeletal: Lower lumbar spine degenerative changes. No aggressive or acute appearing osseous lesions. Marked L4-5 degenerative changes with grade 1 anterolisthesis posterior disc protrusion.  IMPRESSION: Moderate right and small left pleural effusions with underlying ground-glass opacities favored to represent associated atelectasis.  Large amount of stool within the rectum as be seen with constipation.  New large bowel containing left inguinal hernia without evidence for obstruction.  Wall thickening of the urinary bladder which contains Foley catheter. Center correlation with urinalysis to exclude cystitis.   Electronically Signed   By: Lovey Newcomer M.D.   On: 03/07/2015 10:09    Scheduled Meds: . bicalutamide  50 mg Oral Daily  . feeding supplement (ENSURE ENLIVE)  237 mL Oral BID BM  . furosemide  40 mg Oral BID  . senna  1 tablet Oral BID  . Warfarin - Pharmacist Dosing Inpatient   Does not apply q1800  . zolpidem  5 mg Oral Once   Continuous Infusions:   Principal Problem:   Gross hematuria Active Problems:   Pacemaker - Med[employer]onic Adapta 2014, leads 2008  Mitral regurgitation, Severe   Prostate cancer   HTN (hypertension)   Pulmonary HTN   A-fib   Anticoagulated on warfarin   Clot hematuria   Malnutrition of moderate degree   Atrial fibrillation    Time spent: 85 minutes    HONGALGI,ANAND, MD, FACP, FHM. [facility] Pager [phone]  If 7PM-7AM, please contact night-coverage [email or web address] Password [facility] 03/08/2015, 4:06 PM

## 2015-03-09 LAB — VITAMIN D 25 HYDROXY (VIT D DEFICIENCY, FRACTURES): VIT D 25 HYDROXY: 13.2 ng/mL — AB (ref 30.0–100.0)

## 2015-03-09 NOTE — Progress Notes (Signed)
PT Evaluation G-Codes    03/08/15 1030  PT Time Calculation  PT Start Time (ACUTE ONLY) 0957  PT Stop Time (ACUTE ONLY) 1011  PT Time Calculation (min) (ACUTE ONLY) 14 min  PT G-Codes **NOT FOR INPATIENT CLASS**  Functional Assessment Tool Used clinical judgement  Functional Limitation Mobility: Walking and moving around  Mobility: Walking and Moving Around Current Status (L2440) CI  Mobility: Walking and Moving Around Goal Status (N0272) CI  PT General Charges  $$ ACUTE PT VISIT 1 Procedure  PT Evaluation  $Initial PT Evaluation Tier I 1 Procedure   Carmelia Bake, PT, DPT 03/09/2015 Pager: [phone]

## 2015-03-12 LAB — VITAMIN D 1,25 DIHYDROXY
VITAMIN D 1, 25 (OH) TOTAL: 30 pg/mL
VITAMIN D3 1, 25 (OH): 29 pg/mL

## 2015-05-02 ENCOUNTER — Other Ambulatory Visit: Payer: Self-pay | Admitting: Cardiovascular Disease

## 2015-05-03 ENCOUNTER — Encounter: Payer: Commercial Managed Care - HMO | Admitting: Cardiovascular Disease

## 2015-05-03 NOTE — Telephone Encounter (Signed)
Rx(s) sent to pharmacy electronically.  

## 2015-06-02 ENCOUNTER — Encounter: Payer: Self-pay | Admitting: *Deleted

## 2015-07-14 DIAGNOSIS — I4891 Unspecified atrial fibrillation: Secondary | ICD-10-CM | POA: Diagnosis not present

## 2015-07-14 DIAGNOSIS — I340 Nonrheumatic mitral (valve) insufficiency: Secondary | ICD-10-CM | POA: Diagnosis not present

## 2015-07-14 DIAGNOSIS — G459 Transient cerebral ischemic attack, unspecified: Secondary | ICD-10-CM | POA: Diagnosis not present

## 2015-07-14 DIAGNOSIS — M25579 Pain in unspecified ankle and joints of unspecified foot: Secondary | ICD-10-CM | POA: Diagnosis not present

## 2015-07-14 LAB — PROTIME-INR: INR: 3.8 — AB (ref 0.9–1.1)

## 2015-07-25 DIAGNOSIS — H2512 Age-related nuclear cataract, left eye: Secondary | ICD-10-CM | POA: Diagnosis not present

## 2015-07-27 ENCOUNTER — Telehealth: Payer: Self-pay | Admitting: Cardiovascular Disease

## 2015-07-27 DIAGNOSIS — H2512 Age-related nuclear cataract, left eye: Secondary | ICD-10-CM | POA: Diagnosis not present

## 2015-07-28 NOTE — Telephone Encounter (Signed)
Calling about elevated INR, pt to have eye surgery on Feb. 16  LMOM for Norman Lee at [facility], we do not follow INR on patient.

## 2015-07-31 ENCOUNTER — Other Ambulatory Visit: Payer: Self-pay | Admitting: Cardiovascular Disease

## 2015-07-31 DIAGNOSIS — R6889 Other general symptoms and signs: Secondary | ICD-10-CM | POA: Diagnosis not present

## 2015-07-31 DIAGNOSIS — E789 Disorder of lipoprotein metabolism, unspecified: Secondary | ICD-10-CM | POA: Diagnosis not present

## 2015-07-31 DIAGNOSIS — R972 Elevated prostate specific antigen [PSA]: Secondary | ICD-10-CM | POA: Diagnosis not present

## 2015-07-31 DIAGNOSIS — I10 Essential (primary) hypertension: Secondary | ICD-10-CM | POA: Diagnosis not present

## 2015-07-31 DIAGNOSIS — E782 Mixed hyperlipidemia: Secondary | ICD-10-CM | POA: Diagnosis not present

## 2015-08-01 ENCOUNTER — Telehealth: Payer: Self-pay | Admitting: Cardiovascular Disease

## 2015-08-01 LAB — LIPID PANEL W/O CHOL/HDL RATIO
Cholesterol, Total: 103 mg/dL (ref 100–199)
HDL: 51 mg/dL (ref >39)
LDL Calculated: 43 mg/dL (ref 0–99)
Triglycerides: 47 mg/dL (ref 0–149)
VLDL Cholesterol Cal: 9 mg/dL (ref 5–40)

## 2015-08-01 LAB — BASIC METABOLIC PANEL
BUN / CREAT RATIO: 21 (ref 10–22)
BUN: 24 mg/dL (ref 8–27)
CALCIUM: 9.1 mg/dL (ref 8.6–10.2)
CHLORIDE: 99 mmol/L (ref 96–106)
CO2: 29 mmol/L (ref 18–29)
Creatinine, Ser: 1.16 mg/dL (ref 0.76–1.27)
GFR calc non Af Amer: 58 mL/min/{1.73_m2} — ABNORMAL LOW (ref >59)
GFR, EST AFRICAN AMERICAN: 66 mL/min/{1.73_m2} (ref >59)
GLUCOSE: 112 mg/dL — AB (ref 65–99)
POTASSIUM: 3.9 mmol/L (ref 3.5–5.2)
Sodium: 142 mmol/L (ref 134–144)

## 2015-08-01 LAB — HEPATIC FUNCTION PANEL
ALBUMIN: 3.9 g/dL (ref 3.5–4.7)
ALK PHOS: 96 IU/L (ref 39–117)
ALT: 7 IU/L (ref 0–44)
AST: 16 IU/L (ref 0–40)
BILIRUBIN TOTAL: 2 mg/dL — AB (ref 0.0–1.2)
Bilirubin, Direct: 0.75 mg/dL — ABNORMAL HIGH (ref 0.00–0.40)
Total Protein: 6.7 g/dL (ref 6.0–8.5)

## 2015-08-01 LAB — PSA: PROSTATE SPECIFIC AG, SERUM: 1 ng/mL (ref 0.0–4.0)

## 2015-08-01 LAB — PROTIME-INR
INR: 12 (ref 0.8–1.2)
Prothrombin Time: 118.4 s — ABNORMAL HIGH (ref 9.1–12.0)

## 2015-08-01 NOTE — Telephone Encounter (Signed)
New message       Pt was due to have a procedure.  They checked his PT/inr and it was high.  They rechecked it and it was higher.  Calling to let you know that they are faxing the PT/INR results to [name].

## 2015-08-02 ENCOUNTER — Telehealth: Payer: Self-pay | Admitting: Cardiovascular Disease

## 2015-08-02 NOTE — Telephone Encounter (Signed)
Please call,his INR is 12. Pt is very sick,she says she does not know what to do.

## 2015-08-02 NOTE — Telephone Encounter (Signed)
Don't follow INR for Dr. Hardin Negus at our office.  Spoke with Jenny Reichmann to make her aware.  She states patient has had elevated INR x 2, had to reschedule cataract surgery.  I tried to leave a message on his cell phone, VM not set up.

## 2015-08-02 NOTE — Telephone Encounter (Signed)
SPOKE TO WIFE, WIFE STATES PATIENT, INR WAS 12. THE RESULTS WERE GIVEN TO HIM,BY PRIMARY DR MASOUD  DR MASOUD, FOLLOWS  PROTIME  LEVEL.  WIFE STATES,DR MOSOUD TOLD HER TO HOLD WARFARIN FOR 5 DAYS RN INFORMED WIFE TO FOLLOW INSTRUCTION  WIFE VERBALIZED UNDERSTANDING.  WIFE STATES PATIENT IS NOT EATING  , SHORT OF BREATHE, ;LEGS WEEPING SUGGEST WIFE CALL EMS FOR PATIENT TO GO TO HOSPITAL WIFE STATES PATIENT REFUSE TO GO , AND REFUSE FOR ANYONE TO BE A ASSIST OR COME TO HOUSE. WILL CONTACT TOMORROW- WITH POSSIBLE APPOINTMENT WITH EXTENDER OR DOCTOR

## 2015-08-03 ENCOUNTER — Encounter: Admission: RE | Payer: Self-pay | Source: Ambulatory Visit

## 2015-08-03 ENCOUNTER — Ambulatory Visit
Admission: RE | Admit: 2015-08-03 | Payer: Commercial Managed Care - HMO | Source: Ambulatory Visit | Admitting: Ophthalmology

## 2015-08-03 SURGERY — PHACOEMULSIFICATION, CATARACT, WITH IOL INSERTION
Anesthesia: Choice | Laterality: Left

## 2015-08-03 NOTE — Telephone Encounter (Signed)
NO ANSWER NO WAY TO LEAVE MESSAGE APPOINTMENT 08/11/15 2 PM

## 2015-08-03 NOTE — Telephone Encounter (Signed)
SPOKE TO WIFE WIFE STATED EARLIER PATIENT ONLY WANTS TO SEE DR KELLY AWARE TO GO TO ER IF SYMPTOMS BECOME WORSE AWARE OF  APPOINTMENT 08/11/15 VERBALIZED UNDERSTANDING.

## 2015-08-04 ENCOUNTER — Telehealth: Payer: Self-pay | Admitting: Cardiovascular Disease

## 2015-08-04 NOTE — Telephone Encounter (Signed)
New message      Calling to get diagnosis codes on labs for BMP,lipid panel, PSA, liver panel and a PT.  Please use reference code [id number]

## 2015-08-04 NOTE — Telephone Encounter (Signed)
Discussed w/ Mariann Laster. She does not recall Dr. Claiborne Billings ordering these tests.  Has attempted to reach out to patient's office for clarification.

## 2015-08-07 ENCOUNTER — Other Ambulatory Visit: Payer: Self-pay | Admitting: Cardiovascular Disease

## 2015-08-07 DIAGNOSIS — R972 Elevated prostate specific antigen [PSA]: Secondary | ICD-10-CM | POA: Diagnosis not present

## 2015-08-08 LAB — HEPATIC FUNCTION PANEL
ALK PHOS: 95 IU/L (ref 39–117)
ALT: 7 IU/L (ref 0–44)
AST: 19 IU/L (ref 0–40)
Albumin: 4 g/dL (ref 3.5–4.7)
BILIRUBIN, DIRECT: 0.83 mg/dL — AB (ref 0.00–0.40)
Bilirubin Total: 1.9 mg/dL — ABNORMAL HIGH (ref 0.0–1.2)
TOTAL PROTEIN: 7.3 g/dL (ref 6.0–8.5)

## 2015-08-08 LAB — BASIC METABOLIC PANEL
BUN / CREAT RATIO: 21 (ref 10–22)
BUN: 25 mg/dL (ref 8–27)
CO2: 25 mmol/L (ref 18–29)
CREATININE: 1.21 mg/dL (ref 0.76–1.27)
Calcium: 9.5 mg/dL (ref 8.6–10.2)
Chloride: 96 mmol/L (ref 96–106)
GFR, EST AFRICAN AMERICAN: 63 mL/min/{1.73_m2} (ref >59)
GFR, EST NON AFRICAN AMERICAN: 55 mL/min/{1.73_m2} — AB (ref >59)
Glucose: 109 mg/dL — ABNORMAL HIGH (ref 65–99)
Potassium: 4.3 mmol/L (ref 3.5–5.2)
Sodium: 140 mmol/L (ref 134–144)

## 2015-08-08 LAB — PROTIME-INR
INR: 2 — AB (ref 0.8–1.2)
Prothrombin Time: 20.6 s — ABNORMAL HIGH (ref 9.1–12.0)

## 2015-08-08 LAB — PSA: Prostate Specific Ag, Serum: 1.1 ng/mL (ref 0.0–4.0)

## 2015-08-08 NOTE — Telephone Encounter (Signed)
Follow Up  Pt request a call back to discuss INR that was recently drawn. Please call back to discuss.

## 2015-08-11 ENCOUNTER — Ambulatory Visit (INDEPENDENT_AMBULATORY_CARE_PROVIDER_SITE_OTHER): Payer: Medicare Other | Admitting: Cardiovascular Disease

## 2015-08-11 ENCOUNTER — Encounter: Payer: Self-pay | Admitting: Cardiovascular Disease

## 2015-08-11 VITALS — BP 110/60 | HR 73

## 2015-08-11 DIAGNOSIS — R0609 Other forms of dyspnea: Secondary | ICD-10-CM | POA: Diagnosis not present

## 2015-08-11 DIAGNOSIS — R601 Generalized edema: Secondary | ICD-10-CM | POA: Diagnosis not present

## 2015-08-11 DIAGNOSIS — I4819 Other persistent atrial fibrillation: Secondary | ICD-10-CM

## 2015-08-11 DIAGNOSIS — I2583 Coronary atherosclerosis due to lipid rich plaque: Secondary | ICD-10-CM

## 2015-08-11 DIAGNOSIS — I481 Persistent atrial fibrillation: Secondary | ICD-10-CM | POA: Diagnosis not present

## 2015-08-11 DIAGNOSIS — D689 Coagulation defect, unspecified: Secondary | ICD-10-CM

## 2015-08-11 DIAGNOSIS — I2510 Atherosclerotic heart disease of native coronary artery without angina pectoris: Secondary | ICD-10-CM

## 2015-08-11 DIAGNOSIS — I10 Essential (primary) hypertension: Secondary | ICD-10-CM

## 2015-08-11 DIAGNOSIS — Z7901 Long term (current) use of anticoagulants: Secondary | ICD-10-CM

## 2015-08-11 DIAGNOSIS — I5043 Acute on chronic combined systolic (congestive) and diastolic (congestive) heart failure: Secondary | ICD-10-CM

## 2015-08-11 DIAGNOSIS — E785 Hyperlipidemia, unspecified: Secondary | ICD-10-CM

## 2015-08-11 DIAGNOSIS — C61 Malignant neoplasm of prostate: Secondary | ICD-10-CM

## 2015-08-11 MED ORDER — SACUBITRIL-VALSARTAN 24-26 MG PO TABS: 1.0000 | ORAL_TABLET | Freq: Two times a day (BID) | ORAL | Status: DC

## 2015-08-11 NOTE — Patient Instructions (Addendum)
Your physician has recommended you make the following change in your medication:   1.) STOP the ramipril.  2.) take 80 mg of furosemide tomorrow. Then go to 40 mg twice a day .  3.) start new prescription given for Entresto  On SUNDAY 1 tablet twice a day.  Your physician recommends that you return for lab work today and again in 2 week.  Your physician recommends that you schedule a follow-up appointment in: 2 weeks with [facility].  Your physician recommends that you schedule a follow-up appointment in: 6-8 weeks with [name].

## 2015-08-11 NOTE — Telephone Encounter (Signed)
Will address at today's office visit

## 2015-08-12 LAB — PROTIME-INR
INR: 1.52 — ABNORMAL HIGH (ref <1.50)
PROTHROMBIN TIME: 18.5 s — AB (ref 11.6–15.2)

## 2015-08-12 LAB — BRAIN NATRIURETIC PEPTIDE: Brain Natriuretic Peptide: 623.5 pg/mL — ABNORMAL HIGH (ref <100)

## 2015-08-12 LAB — PRO B NATRIURETIC PEPTIDE: PRO B NATRI PEPTIDE: 7138 pg/mL — AB (ref <451)

## 2015-08-13 ENCOUNTER — Encounter: Payer: Self-pay | Admitting: Cardiovascular Disease

## 2015-08-13 DIAGNOSIS — I5040 Unspecified combined systolic (congestive) and diastolic (congestive) heart failure: Secondary | ICD-10-CM | POA: Insufficient documentation

## 2015-08-13 NOTE — Progress Notes (Signed)
Patient ID: Norman Lee, male   DOB: [birth year], 82 y.o.   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 He also has sick sinus syndrome as well as permanent atrial fibrillation and is status post single-chamber pacemaker implantation initially in May 2008 and in April 2014 he underwent a generator change with a current device being a Medtronic Adapta ADD RL1 device. The patient's last cardiac catheterization was in 2008 which showed progressive 70% eccentric mid right artery stenosis. Circumflex intervention sites were patent although he had residual disease 20-30% proximally, the marginal vessel had 50-60% in the AV groove and there was 30% LAD stenosis. He has an upper lateral wall aneurysm from his initial late presentation left circumflex infarction. He has developed severe mitral regurgitation most likely due to a flail leaflet of the middle scallop of the posterior limb leaflet. We have discussed potential valve repair but ultimately he has refused this.   He  has a history of prostate CA apparently he underwent cryoablation 2011 by Dr. Gaynelle Arabian but his PSAs have been slowly rising again.  Recent laboratory from 2 weeks ago revealed his PSA had risen to 16.0.  He tells me he will be having a follow-up evaluation with Dr. Gaynelle Arabian.  In May 2015 he was hospitalized with pneumonia.  He also states he has developed a left inguinal hernia.  He has difficulty with chronic back problems.  As I last saw him, he admits to exertional shortness of breath.  However, over the past 2 months he has noticed progressive lower extremity edema.  He denies any chest pressure.  He has only been taking Lasix 20 mg daily.  He also has been  taking ramiopril 10 mg daily and Toprol XL 12.5 mg for blood pressure.  His lipids have been treated with simvastatin.  He is on chronic Coumadin therapy.    Since I last saw him one year ago, he has seen Dr. Sallyanne Kuster for his pacemaker follow-up.  He was hospitalized in September 2016 by Dr. Gaynelle Arabian who is been following his prostate CA at that time had hematuria on presentation.  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  Ever weeks ago.  He was to have cataract surgery but an INR prior to that was supratherapeutic at greater than 12.  He self discontinued his Coumadin for a week and then reduced his dose from 2.5-1 mg.  A subsequent INR level IV days previously was 2.0.  He denies any chest pain.  He has progressive lower extremity edema and significant dyspnea with exertion with congestive heart failure.  In the past, he was found to have significant mitral regurgitation but refused consideration for mitral valve repair.  His BUN was 25, creatinine 1.21.  He presents now for evaluation  Past Medical History  Diagnosis Date  . Prostate cancer ([place])   . CAD (coronary artery disease)     Circumflex MI 1995, Last cath 2008  . Mitral regurgitation     Severe  . HLD (hyperlipidemia)   . HTN (hypertension)   . Atrial fibrillation ([place])   . Pulmonary hypertension ([place])   . Presence of permanent cardiac pacemaker  10/16/2006,09/04/12    Medtronic adapta  . Chronic anticoagulation   . Dysrhythmia     ATRIAL FIBRILATION  . Pacemaker     Past Surgical History  Procedure Laterality Date  . Permanent pacemaker insertion  10/16/2006    Medtronic Enrhythm  . Permanent pacemaker generator change  09/04/2012    Medtronic adapta  . Coronary angioplasty  09/16/1993    100% CX-successful  . Cardiac catheterization  05/11/2002    70-80% small first diagonal,50% RCA  . Cardiac catheterization  10/14/2006    multivessell,EF 40-50%  . Nm myoview ltd  08/30/2009     mod. perfusion defect-mid inferior,apical inferior,basal inferolateral,mid inferolateral & apical lateral regions  . US echocardiography  03/27/2012    severe MR persists w/progressive LA dilatation,RSVP has increased & AS has progressed  . Permanent pacemaker generator change N/A 09/04/2012    Procedure: PERMANENT PACEMAKER GENERATOR CHANGE;  Surgeon: Sanda Klein, MD;  Location: [facility];  Service: Cardiovascular;  Laterality: N/A;  . Cholecystectomy      Allergies  Allergen Reactions  . Codeine Nausea And Vomiting    Current Outpatient Prescriptions  Medication Sig Dispense Refill  . acetaminophen (TYLENOL) 500 MG tablet Take 500-1,000 mg by mouth every 6 (six) hours as needed for moderate pain.    Marland Kitchen aspirin 81 MG tablet Take 81 mg by mouth daily.    . bicalutamide (CASODEX) 50 MG tablet Take 1 tablet (50 mg total) by mouth daily. 30 tablet 5  . feeding supplement, ENSURE ENLIVE, (ENSURE ENLIVE) LIQD Take 237 mLs by mouth 2 (two) times daily between meals. 237 mL 12  . furosemide (LASIX) 40 MG tablet Take 40 mg by mouth daily.    . hydrocortisone cream 1 % Apply 1 application topically daily as needed for itching (psoriasis).    . simvastatin (ZOCOR) 20 MG tablet TAKE 1 TABLET (20 MG TOTAL) BY MOUTH AT BEDTIME. 90 tablet 2  . warfarin (COUMADIN) 5 MG tablet Take 2.5-5 mg by mouth See admin instructions. Take 1 tablet on Tuesday and Friday then take 1/2 tablet all the other days    . warfarin (COUMADIN) 5 MG tablet Take 1,2 dose of warfarin, and adjust according to INR. 90 tablet 1  . sacubitril-valsartan (ENTRESTO) 24-26 MG Take 1 tablet by mouth 2 (two) times daily. 28 tablet 0   No current facility-administered medications for this visit.    Social History   Social History  . Marital Status: Married    Spouse Name: [name]  . Number of Children: 1  . Years of Education: Doctorate   Occupational History  . Not on file.   Social History Main Topics  . Smoking  status: Never Smoker   . Smokeless tobacco: Never Used  . Alcohol Use: No  . Drug Use: No  . Sexual Activity: Not on file   Other Topics Concern  . Not on file   Social History Narrative   Lives at home with wife.    Caffeine use: drinks coffee (2-3 cups per day)    Family History  Problem Relation Age of Onset  . Diabetes Sister    ROS  General: positive fevers, chills, no night sweats;  HEENT: Decreased hearing; No changes in vision, , difficulty swallowing  Pulmonary: positive for cough, wheezing, shortness of breath, no hemoptysis  Cardiovascular: Positive for shortness of breath and progressive lower extremity edema ; No chest pain, presyncope, syncope, palpatations  GI: Negative; No nausea, vomiting, diarrhea, or abdominal pain  GU: Positive for development of a left inguinal hernia; positive for prostate CA No dysuria, hematuria, or difficulty voiding  Musculoskeletal: Positive for chronic back problems.; no myalgias, joint pain, or weakness  Hematologic/Oncology: Positive for prostate Ca; no easy bruising, bleeding  Endocrine: Negative; no heat/cold intolerance  Neuro: Negative; no changes in balance, headaches  Skin: Negative; No rashes or skin lesions  Psychiatric: Negative; No behavioral problems, depression  Sleep: Negative; No snoring, daytime sleepiness, hypersomnolence, bruxism, restless legs, hypnogognic hallucinations, no cataplexy  Other comprehensive 14 review of systems was negative    PE BP 110/60 mmHg  Pulse 73   Wt Readings from Last 3 Encounters:  03/08/15 153 lb (69.4 kg)  02/25/15 200 lb (90.719 kg)  09/06/14 167 lb 1.6 oz (75.796 kg)   General: Alert, oriented, no distress.  Skin: Pilonidal cyst in the subxiphoid region HEENT: Normocephalic, atraumatic. Pupils round and reactive; sclera anicteric;no lid lag.  Nose without nasal septal hypertrophy Mouth/Parynx benign; Mallinpatti scale 3 Chest wall: Nontender to palpation Neck: No JVD, no  carotid bruits Lungs: No wheezing.  No rales Heart: Regular, paced rhythm at 70 beats per minute, s1 s2 normal 3-4/7 holosystolic murmur at the apex radiating to the axilla concordant with his severe mitral regurgitation. Abdomen: soft, nontender; no hepatosplenomehaly, BS+; abdominal aorta nontender and not dilated by palpation. Back: No CVA tenderness Pulses 2+ Extremities: 2-3+ pitting edema to the lower extremities, Homan's sign negative  Neurologic: grossly nonfocal Psychological: Intact mood and affect  ECG (independently read by me): Underlying atrial fibrillation with ventricular paced rhythm at 73 bpm  February 2016 ECG (independently read by me): Ventricular paced rhythm at 74 bpm.  100% pacing.  Underlying atrial fibrillation.  01/19/2014 ECG (independently read by me): Underlying atrial fibrillation, ventricular paced rhythm at 71 beats per minute  Prior May 2050 ECG  (independently read by me): Ventricular paced rhythm with underlying AF  LABS:  BMP Latest Ref Rng 08/07/2015 07/31/2015 03/08/2015  Glucose 65 - 99 mg/dL 109(H) 112(H) 102(H)  BUN 8 - 27 mg/dL 25 24 22(H)  Creatinine 0.76 - 1.27 mg/dL 1.21 1.16 1.16  BUN/Creat Ratio 10 - '22 21 21 '$ -  Sodium 134 - 144 mmol/L 140 142 136  Potassium 3.5 - 5.2 mmol/L 4.3 3.9 3.9  Chloride 96 - 106 mmol/L 96 99 98(L)  CO2 18 - 29 mmol/L '25 29 30  '$ Calcium 8.6 - 10.2 mg/dL 9.5 9.1 8.9    Hepatic Function Latest Ref Rng 08/07/2015 07/31/2015 03/08/2015  Total Protein 6.0 - 8.5 g/dL 7.3 6.7 5.9(L)  Albumin 3.5 - 4.7 g/dL 4.0 3.9 3.4(L)  AST 0 - 40 IU/L '19 16 25  '$ ALT 0 - 44 IU/L '7 7 20  '$ Alk Phosphatase 39 - 117 IU/L 95 96 95  Total Bilirubin 0.0 - 1.2 mg/dL 1.9(H) 2.0(H) 2.8(H)  Bilirubin, Direct 0.00 - 0.40 mg/dL 0.83(H) 0.75(H) -    CBC Latest Ref Rng 03/08/2015 03/07/2015 03/06/2015  WBC 4.0 - 10.5 K/uL 10.1 8.9 11.4(H)  Hemoglobin 13.0 - 17.0 g/dL 13.5 13.1 14.5  Hematocrit 39.0 - 52.0 % 38.7(L) 37.9(L) 42.1  Platelets 150 -  400 K/uL 192 191 202    BNP No results found for: BNP  ProBNP    Component Value Date/Time   PROBNP 7138.00* 08/11/2015 1559     Lipid Panel     Component Value Date/Time   CHOL 103 07/31/2015 1536   CHOL 106 10/20/2013 0517   TRIG 47 07/31/2015 1536   HDL  51 07/31/2015 1536   HDL 28* 10/20/2013 0517   CHOLHDL 3.8 10/20/2013 0517   VLDL 18 10/20/2013 0517   LDLCALC 43 07/31/2015 1536   LDLCALC 60 10/20/2013 0517   PSA: 1.1  RADIOLOGY: No results found.    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  He is status post a large left circumflex coronary artery infarction when he presented late but underwent successful intervention with PTCA to circumflex vessel in 1995. He is documented upper lateral wall scar from this event. His last cardiac catheterization was 2008 . His last nuclear perfusion study was March 2011 which showed his old left circumflex inferior to inferolateral scar without associated ischemia. He is status post pacemaker generator change secondary to previous end of life on his battery. He has severe mitral regurgitation most likely due to a flail leaflet and had  decided against consideration for mitral valve repair. He is followed by Dr. Gaynelle Arabian for his prostate CA is status post cryoablation in 2011, and had subsequent rising PSA levels which now have been normalized.  He is developed increasing CHF symptomatology, which I suspect is both acute on chronic systolic and diastolic dysfunction.  He has been maintained on ramipril 10 mg and furosemide 40 mg.  He continued to have significant edema.  I have recommended that he take Lasix 80 mg in the morning for at least the next day or 2 and then 40 mg twice a day.  I feel he may be a good candidate to initiate entresto and will start him on 24/26 mg.  He will not take his ramipril tomorrow and will start the entresto the following day.  Again  discussed support stockings.  I will check a proBNP today as a baseline and a follow-up INR.  In 2 weeks repeat blood work will be obtained.  Since entresto will increase the BNP level, but decreased the proBNP level this will be repeated at that time.  He continues to be on Coumadin, which will need to be regulated closely.  He will see Erasmo Downer our Pharm.D. in 2 weeks following initiation of entresto therapy and I will see him in 4-6 weeks thereafter for follow-up evaluation.   Troy Sine, MD, [facility]  08/13/2015 9:25 AM

## 2015-08-15 ENCOUNTER — Telehealth: Payer: Self-pay | Admitting: Cardiovascular Disease

## 2015-08-15 NOTE — Telephone Encounter (Signed)
New message      Pt c/o medication issue:  1. Name of Medication: entresto 2. How are you currently taking this medication (dosage and times per day)? 24-26 twice a day 3. Are you having a reaction (difficulty breathing--STAT)?  4. What is your medication issue? Nausea, sob (pt is always sob but it is worse since being on rx, no appetite and no energy. ---wife is worried because he has not energy and appetite---

## 2015-08-15 NOTE — Telephone Encounter (Signed)
Pt has been having some continued issues w/ swelling on ankles. Recent increase on furosemide when seen the 24th. Recommendations and suggestions regarding leg elevation, salt avoidance, etc, given. Pt has declined to elevate legs due to discomfort. Encouraged to do so esp around time of lasix administration. Pt has been low on PO intake today w/ poor appetite and nausea. Advised to encourage fluids and consider primary care management. Pt will call if not improved.

## 2015-08-16 DIAGNOSIS — L89152 Pressure ulcer of sacral region, stage 2: Secondary | ICD-10-CM | POA: Diagnosis not present

## 2015-08-16 DIAGNOSIS — I2510 Atherosclerotic heart disease of native coronary artery without angina pectoris: Secondary | ICD-10-CM | POA: Diagnosis not present

## 2015-08-16 DIAGNOSIS — C61 Malignant neoplasm of prostate: Secondary | ICD-10-CM | POA: Diagnosis not present

## 2015-08-16 DIAGNOSIS — I119 Hypertensive heart disease without heart failure: Secondary | ICD-10-CM | POA: Diagnosis not present

## 2015-08-16 DIAGNOSIS — M5440 Lumbago with sciatica, unspecified side: Secondary | ICD-10-CM | POA: Diagnosis not present

## 2015-08-17 NOTE — Telephone Encounter (Signed)
ok 

## 2015-08-18 DIAGNOSIS — I2510 Atherosclerotic heart disease of native coronary artery without angina pectoris: Secondary | ICD-10-CM | POA: Diagnosis not present

## 2015-08-18 DIAGNOSIS — C61 Malignant neoplasm of prostate: Secondary | ICD-10-CM | POA: Diagnosis not present

## 2015-08-18 DIAGNOSIS — M5440 Lumbago with sciatica, unspecified side: Secondary | ICD-10-CM | POA: Diagnosis not present

## 2015-08-18 DIAGNOSIS — I119 Hypertensive heart disease without heart failure: Secondary | ICD-10-CM | POA: Diagnosis not present

## 2015-08-18 DIAGNOSIS — L89152 Pressure ulcer of sacral region, stage 2: Secondary | ICD-10-CM | POA: Diagnosis not present

## 2015-08-21 ENCOUNTER — Telehealth: Payer: Self-pay | Admitting: Cardiovascular Disease

## 2015-08-21 DIAGNOSIS — I2510 Atherosclerotic heart disease of native coronary artery without angina pectoris: Secondary | ICD-10-CM | POA: Diagnosis not present

## 2015-08-21 DIAGNOSIS — L89152 Pressure ulcer of sacral region, stage 2: Secondary | ICD-10-CM | POA: Diagnosis not present

## 2015-08-21 DIAGNOSIS — M5440 Lumbago with sciatica, unspecified side: Secondary | ICD-10-CM | POA: Diagnosis not present

## 2015-08-21 DIAGNOSIS — C61 Malignant neoplasm of prostate: Secondary | ICD-10-CM | POA: Diagnosis not present

## 2015-08-21 DIAGNOSIS — I119 Hypertensive heart disease without heart failure: Secondary | ICD-10-CM | POA: Diagnosis not present

## 2015-08-21 NOTE — Telephone Encounter (Signed)
Follow up       *STAT* If patient is at the pharmacy, call can be transferred to refill team.   1. Which medications need to be refilled? (please list name of each medication and dose if known) warfarin 1mg ---1-2 tablets daily 2. Which pharmacy/location (including street and city if local pharmacy) is medication to be sent to? [place]  3. Do they need a 30 day or 90 day supply?  60 tablets PT IS OUT OF MEDICATION

## 2015-08-21 NOTE — Telephone Encounter (Signed)
[facility] called for Dr. Hardin Negus. Notified INR 1.5 today. Pt taking "1mg  of coumadin daily" - cannot verify strength of pills he is taking at present. Pt also wished to inform Dr. Claiborne Billings he stopped entresto d/t feeling short of breath on the medication.  Will route for advice.

## 2015-08-22 ENCOUNTER — Other Ambulatory Visit: Payer: Self-pay | Admitting: Pharmacist Clinician (PhC)/ Clinical Pharmacy Specialist

## 2015-08-22 MED ORDER — WARFARIN SODIUM 1 MG PO TABS: ORAL_TABLET | ORAL | Status: AC

## 2015-08-22 MED ORDER — WARFARIN SODIUM 1 MG PO TABS: ORAL_TABLET | ORAL | Status: DC

## 2015-08-24 ENCOUNTER — Encounter: Payer: Medicare Other | Admitting: Pharmacist Clinician (PhC)/ Clinical Pharmacy Specialist

## 2015-08-25 DIAGNOSIS — C61 Malignant neoplasm of prostate: Secondary | ICD-10-CM | POA: Diagnosis not present

## 2015-08-25 DIAGNOSIS — L89152 Pressure ulcer of sacral region, stage 2: Secondary | ICD-10-CM | POA: Diagnosis not present

## 2015-08-25 DIAGNOSIS — I2510 Atherosclerotic heart disease of native coronary artery without angina pectoris: Secondary | ICD-10-CM | POA: Diagnosis not present

## 2015-08-25 DIAGNOSIS — M5440 Lumbago with sciatica, unspecified side: Secondary | ICD-10-CM | POA: Diagnosis not present

## 2015-08-25 DIAGNOSIS — I119 Hypertensive heart disease without heart failure: Secondary | ICD-10-CM | POA: Diagnosis not present

## 2015-08-28 ENCOUNTER — Emergency Department (HOSPITAL_COMMUNITY): Payer: Medicare Other

## 2015-08-28 ENCOUNTER — Inpatient Hospital Stay (HOSPITAL_COMMUNITY)
Admission: EM | Admit: 2015-08-28 | Discharge: 2015-09-01 | DRG: 871 | Disposition: A | Discharge status: Hospice | Payer: Medicare Other | Attending: Student in an Organized Health Care Education/Training Program | Admitting: Student in an Organized Health Care Education/Training Program

## 2015-08-28 ENCOUNTER — Encounter (HOSPITAL_COMMUNITY): Payer: Self-pay | Admitting: Emergency Medicine

## 2015-08-28 DIAGNOSIS — I340 Nonrheumatic mitral (valve) insufficiency: Secondary | ICD-10-CM | POA: Diagnosis not present

## 2015-08-28 DIAGNOSIS — J9621 Acute and chronic respiratory failure with hypoxia: Secondary | ICD-10-CM | POA: Diagnosis not present

## 2015-08-28 DIAGNOSIS — I480 Paroxysmal atrial fibrillation: Secondary | ICD-10-CM | POA: Diagnosis present

## 2015-08-28 DIAGNOSIS — J9622 Acute and chronic respiratory failure with hypercapnia: Secondary | ICD-10-CM | POA: Diagnosis not present

## 2015-08-28 DIAGNOSIS — I10 Essential (primary) hypertension: Secondary | ICD-10-CM | POA: Diagnosis present

## 2015-08-28 DIAGNOSIS — I110 Hypertensive heart disease with heart failure: Secondary | ICD-10-CM | POA: Diagnosis not present

## 2015-08-28 DIAGNOSIS — E559 Vitamin D deficiency, unspecified: Secondary | ICD-10-CM | POA: Diagnosis present

## 2015-08-28 DIAGNOSIS — Z8546 Personal history of malignant neoplasm of prostate: Secondary | ICD-10-CM

## 2015-08-28 DIAGNOSIS — T502X5A Adverse effect of carbonic-anhydrase inhibitors, benzothiadiazides and other diuretics, initial encounter: Secondary | ICD-10-CM | POA: Diagnosis not present

## 2015-08-28 DIAGNOSIS — L8990 Pressure ulcer of unspecified site, unspecified stage: Secondary | ICD-10-CM | POA: Insufficient documentation

## 2015-08-28 DIAGNOSIS — G9340 Encephalopathy, unspecified: Secondary | ICD-10-CM | POA: Diagnosis present

## 2015-08-28 DIAGNOSIS — I472 Ventricular tachycardia: Secondary | ICD-10-CM | POA: Diagnosis present

## 2015-08-28 DIAGNOSIS — R319 Hematuria, unspecified: Secondary | ICD-10-CM | POA: Diagnosis not present

## 2015-08-28 DIAGNOSIS — Z950 Presence of cardiac pacemaker: Secondary | ICD-10-CM | POA: Diagnosis present

## 2015-08-28 DIAGNOSIS — A419 Sepsis, unspecified organism: Secondary | ICD-10-CM | POA: Diagnosis not present

## 2015-08-28 DIAGNOSIS — J189 Pneumonia, unspecified organism: Secondary | ICD-10-CM | POA: Diagnosis not present

## 2015-08-28 DIAGNOSIS — I272 Other secondary pulmonary hypertension: Secondary | ICD-10-CM | POA: Diagnosis present

## 2015-08-28 DIAGNOSIS — I25118 Atherosclerotic heart disease of native coronary artery with other forms of angina pectoris: Secondary | ICD-10-CM

## 2015-08-28 DIAGNOSIS — R0902 Hypoxemia: Secondary | ICD-10-CM

## 2015-08-28 DIAGNOSIS — I255 Ischemic cardiomyopathy: Secondary | ICD-10-CM | POA: Diagnosis not present

## 2015-08-28 DIAGNOSIS — J90 Pleural effusion, not elsewhere classified: Secondary | ICD-10-CM | POA: Diagnosis not present

## 2015-08-28 DIAGNOSIS — Z66 Do not resuscitate: Secondary | ICD-10-CM | POA: Diagnosis present

## 2015-08-28 DIAGNOSIS — I4891 Unspecified atrial fibrillation: Secondary | ICD-10-CM | POA: Diagnosis present

## 2015-08-28 DIAGNOSIS — Z79899 Other long term (current) drug therapy: Secondary | ICD-10-CM

## 2015-08-28 DIAGNOSIS — L89152 Pressure ulcer of sacral region, stage 2: Secondary | ICD-10-CM | POA: Diagnosis not present

## 2015-08-28 DIAGNOSIS — I252 Old myocardial infarction: Secondary | ICD-10-CM | POA: Diagnosis not present

## 2015-08-28 DIAGNOSIS — J9600 Acute respiratory failure, unspecified whether with hypoxia or hypercapnia: Secondary | ICD-10-CM | POA: Diagnosis not present

## 2015-08-28 DIAGNOSIS — L97509 Non-pressure chronic ulcer of other part of unspecified foot with unspecified severity: Secondary | ICD-10-CM | POA: Diagnosis present

## 2015-08-28 DIAGNOSIS — I482 Chronic atrial fibrillation: Secondary | ICD-10-CM | POA: Diagnosis not present

## 2015-08-28 DIAGNOSIS — J9602 Acute respiratory failure with hypercapnia: Secondary | ICD-10-CM | POA: Diagnosis not present

## 2015-08-28 DIAGNOSIS — J9601 Acute respiratory failure with hypoxia: Secondary | ICD-10-CM | POA: Diagnosis present

## 2015-08-28 DIAGNOSIS — Z7982 Long term (current) use of aspirin: Secondary | ICD-10-CM | POA: Diagnosis not present

## 2015-08-28 DIAGNOSIS — R069 Unspecified abnormalities of breathing: Secondary | ICD-10-CM | POA: Diagnosis not present

## 2015-08-28 DIAGNOSIS — I350 Nonrheumatic aortic (valve) stenosis: Secondary | ICD-10-CM | POA: Diagnosis not present

## 2015-08-28 DIAGNOSIS — Z7189 Other specified counseling: Secondary | ICD-10-CM | POA: Diagnosis present

## 2015-08-28 DIAGNOSIS — R0602 Shortness of breath: Secondary | ICD-10-CM | POA: Diagnosis not present

## 2015-08-28 DIAGNOSIS — C61 Malignant neoplasm of prostate: Secondary | ICD-10-CM | POA: Diagnosis present

## 2015-08-28 DIAGNOSIS — E785 Hyperlipidemia, unspecified: Secondary | ICD-10-CM | POA: Diagnosis not present

## 2015-08-28 DIAGNOSIS — Z515 Encounter for palliative care: Secondary | ICD-10-CM | POA: Diagnosis present

## 2015-08-28 DIAGNOSIS — I5043 Acute on chronic combined systolic (congestive) and diastolic (congestive) heart failure: Secondary | ICD-10-CM | POA: Diagnosis present

## 2015-08-28 DIAGNOSIS — I495 Sick sinus syndrome: Secondary | ICD-10-CM | POA: Diagnosis present

## 2015-08-28 DIAGNOSIS — I5040 Unspecified combined systolic (congestive) and diastolic (congestive) heart failure: Secondary | ICD-10-CM | POA: Diagnosis present

## 2015-08-28 DIAGNOSIS — Z885 Allergy status to narcotic agent status: Secondary | ICD-10-CM

## 2015-08-28 DIAGNOSIS — I2510 Atherosclerotic heart disease of native coronary artery without angina pectoris: Secondary | ICD-10-CM | POA: Diagnosis present

## 2015-08-28 DIAGNOSIS — Z7901 Long term (current) use of anticoagulants: Secondary | ICD-10-CM | POA: Diagnosis not present

## 2015-08-28 DIAGNOSIS — J810 Acute pulmonary edema: Secondary | ICD-10-CM | POA: Diagnosis present

## 2015-08-28 HISTORY — DX: Heart failure, unspecified: I50.9

## 2015-08-28 LAB — COMPREHENSIVE METABOLIC PANEL
ALT: 12 U/L — AB (ref 17–63)
AST: 23 U/L (ref 15–41)
Albumin: 3.8 g/dL (ref 3.5–5.0)
Alkaline Phosphatase: 97 U/L (ref 38–126)
Anion gap: 14 (ref 5–15)
BILIRUBIN TOTAL: 1.6 mg/dL — AB (ref 0.3–1.2)
BUN: 22 mg/dL — AB (ref 6–20)
CO2: 33 mmol/L — ABNORMAL HIGH (ref 22–32)
CREATININE: 1.14 mg/dL (ref 0.61–1.24)
Calcium: 9.9 mg/dL (ref 8.9–10.3)
Chloride: 92 mmol/L — ABNORMAL LOW (ref 101–111)
GFR, EST NON AFRICAN AMERICAN: 57 mL/min — AB (ref >60)
Glucose, Bld: 127 mg/dL — ABNORMAL HIGH (ref 65–99)
Potassium: 3.3 mmol/L — ABNORMAL LOW (ref 3.5–5.1)
Sodium: 139 mmol/L (ref 135–145)
TOTAL PROTEIN: 7.6 g/dL (ref 6.5–8.1)

## 2015-08-28 LAB — I-STAT ARTERIAL BLOOD GAS, ED
ACID-BASE EXCESS: 9 mmol/L — AB (ref 0.0–2.0)
Acid-Base Excess: 11 mmol/L — ABNORMAL HIGH (ref 0.0–2.0)
BICARBONATE: 41.8 meq/L — AB (ref 20.0–24.0)
Bicarbonate: 37 mEq/L — ABNORMAL HIGH (ref 20.0–24.0)
O2 Saturation: 96 %
O2 Saturation: 96 %
PCO2 ART: 63.8 mmHg — AB (ref 35.0–45.0)
PCO2 ART: 85.7 mmHg — AB (ref 35.0–45.0)
PH ART: 7.371 (ref 7.350–7.450)
PO2 ART: 85 mmHg (ref 80.0–100.0)
PO2 ART: 92 mmHg (ref 80.0–100.0)
Patient temperature: 97.9
TCO2: 39 mmol/L (ref 0–100)
TCO2: 44 mmol/L (ref 0–100)
pH, Arterial: 7.294 — ABNORMAL LOW (ref 7.350–7.450)

## 2015-08-28 LAB — URINALYSIS, ROUTINE W REFLEX MICROSCOPIC
BILIRUBIN URINE: NEGATIVE
Glucose, UA: NEGATIVE mg/dL
Hgb urine dipstick: NEGATIVE
Ketones, ur: NEGATIVE mg/dL
NITRITE: NEGATIVE
PROTEIN: NEGATIVE mg/dL
SPECIFIC GRAVITY, URINE: 1.015 (ref 1.005–1.030)
pH: 5 (ref 5.0–8.0)

## 2015-08-28 LAB — PROTIME-INR
INR: 1.49 (ref 0.00–1.49)
Prothrombin Time: 18.1 seconds — ABNORMAL HIGH (ref 11.6–15.2)

## 2015-08-28 LAB — TROPONIN I
TROPONIN I: 0.06 ng/mL — AB (ref <0.031)
TROPONIN I: 0.05 ng/mL — AB (ref <0.031)

## 2015-08-28 LAB — CBC WITH DIFFERENTIAL/PLATELET
BASOS ABS: 0 10*3/uL (ref 0.0–0.1)
Basophils Relative: 0 %
Eosinophils Absolute: 0 10*3/uL (ref 0.0–0.7)
Eosinophils Relative: 0 %
HEMATOCRIT: 43.7 % (ref 39.0–52.0)
Hemoglobin: 14.2 g/dL (ref 13.0–17.0)
LYMPHS ABS: 1 10*3/uL (ref 0.7–4.0)
LYMPHS PCT: 7 %
MCH: 31.3 pg (ref 26.0–34.0)
MCHC: 32.5 g/dL (ref 30.0–36.0)
MCV: 96.3 fL (ref 78.0–100.0)
MONO ABS: 1.4 10*3/uL — AB (ref 0.1–1.0)
Monocytes Relative: 10 %
NEUTROS ABS: 11.5 10*3/uL — AB (ref 1.7–7.7)
Neutrophils Relative %: 83 %
Platelets: 287 10*3/uL (ref 150–400)
RBC: 4.54 MIL/uL (ref 4.22–5.81)
RDW: 14.6 % (ref 11.5–15.5)
WBC: 14 10*3/uL — ABNORMAL HIGH (ref 4.0–10.5)

## 2015-08-28 LAB — I-STAT TROPONIN, ED: Troponin i, poc: 0.05 ng/mL (ref 0.00–0.08)

## 2015-08-28 LAB — URINE MICROSCOPIC-ADD ON

## 2015-08-28 LAB — BRAIN NATRIURETIC PEPTIDE: B Natriuretic Peptide: 1748.7 pg/mL — ABNORMAL HIGH (ref 0.0–100.0)

## 2015-08-28 LAB — MAGNESIUM: MAGNESIUM: 1.9 mg/dL (ref 1.7–2.4)

## 2015-08-28 LAB — I-STAT CG4 LACTIC ACID, ED: LACTIC ACID, VENOUS: 2.14 mmol/L — AB (ref 0.5–2.0)

## 2015-08-28 LAB — MRSA PCR SCREENING: MRSA by PCR: NEGATIVE

## 2015-08-28 LAB — PROCALCITONIN: Procalcitonin: <0.1 ng/mL

## 2015-08-28 MED ORDER — FUROSEMIDE 10 MG/ML IJ SOLN
40.0000 mg | Freq: Once | INTRAMUSCULAR | Status: AC
  Administered 2015-08-28: 40 mg via INTRAVENOUS

## 2015-08-28 MED ORDER — ACETAMINOPHEN 650 MG RE SUPP: 650.0000 mg | Freq: Four times a day (QID) | RECTAL | Status: DC | PRN

## 2015-08-28 MED ORDER — ACETAMINOPHEN 325 MG PO TABS: 650.0000 mg | ORAL_TABLET | Freq: Four times a day (QID) | ORAL | Status: DC | PRN

## 2015-08-28 MED ORDER — FUROSEMIDE 10 MG/ML IJ SOLN
40.0000 mg | Freq: Once | INTRAMUSCULAR | Status: DC
  Filled 2015-08-28: qty 4

## 2015-08-28 MED ORDER — ENSURE ENLIVE PO LIQD: 237.0000 mL | Freq: Two times a day (BID) | ORAL | Status: DC

## 2015-08-28 MED ORDER — SACUBITRIL-VALSARTAN 24-26 MG PO TABS
1.0000 | ORAL_TABLET | Freq: Two times a day (BID) | ORAL | Status: DC
  Filled 2015-08-28 (×3): qty 1

## 2015-08-28 MED ORDER — SIMVASTATIN 20 MG PO TABS
20.0000 mg | ORAL_TABLET | Freq: Every day | ORAL | Status: DC
  Administered 2015-08-29 – 2015-08-30 (×2): 20 mg via ORAL
  Filled 2015-08-28 (×3): qty 1

## 2015-08-28 MED ORDER — PIPERACILLIN-TAZOBACTAM 3.375 G IVPB 30 MIN
3.3750 g | Freq: Once | INTRAVENOUS | Status: DC
  Filled 2015-08-28: qty 50

## 2015-08-28 MED ORDER — WARFARIN SODIUM 2 MG PO TABS
2.0000 mg | ORAL_TABLET | Freq: Once | ORAL | Status: DC
  Filled 2015-08-28 (×2): qty 1

## 2015-08-28 MED ORDER — ONDANSETRON HCL 4 MG PO TABS: 4.0000 mg | ORAL_TABLET | Freq: Four times a day (QID) | ORAL | Status: DC | PRN

## 2015-08-28 MED ORDER — POTASSIUM CHLORIDE 10 MEQ/100ML IV SOLN
10.0000 meq | INTRAVENOUS | Status: AC
  Administered 2015-08-28 (×3): 10 meq via INTRAVENOUS
  Filled 2015-08-28 (×3): qty 100

## 2015-08-28 MED ORDER — PROMETHAZINE HCL 25 MG/ML IJ SOLN
12.5000 mg | Freq: Four times a day (QID) | INTRAMUSCULAR | Status: DC | PRN
  Filled 2015-08-28: qty 1

## 2015-08-28 MED ORDER — ONDANSETRON HCL 4 MG/2ML IJ SOLN: 4.0000 mg | Freq: Four times a day (QID) | INTRAMUSCULAR | Status: DC | PRN

## 2015-08-28 MED ORDER — WARFARIN - PHARMACIST DOSING INPATIENT: Freq: Every day | Status: DC

## 2015-08-28 MED ORDER — FUROSEMIDE 10 MG/ML IJ SOLN
80.0000 mg | Freq: Two times a day (BID) | INTRAMUSCULAR | Status: DC
  Administered 2015-08-28: 80 mg via INTRAVENOUS
  Administered 2015-08-28: 40 mg via INTRAVENOUS
  Filled 2015-08-28 (×3): qty 8

## 2015-08-28 MED ORDER — VANCOMYCIN HCL IN DEXTROSE 1-5 GM/200ML-% IV SOLN
1000.0000 mg | Freq: Once | INTRAVENOUS | Status: DC
  Filled 2015-08-28: qty 200

## 2015-08-28 MED ORDER — POTASSIUM CHLORIDE 10 MEQ/100ML IV SOLN: 10.0000 meq | INTRAVENOUS | Status: DC

## 2015-08-28 NOTE — ED Notes (Signed)
Pt's son called to speak with pt's physician. States he received a distressing phone call from his mother regarding pt's status. Internal Medicine notified -- given son's phone number.  Caliph Quinto -- [phone]

## 2015-08-28 NOTE — ED Provider Notes (Addendum)
CSN: [id number]     Arrival date & time 08/28/15  0536 History   First MD Initiated Contact with Patient 08/28/15 0542     Chief Complaint  Patient presents with  . Shortness of Breath     (Consider location/radiation/quality/duration/timing/severity/associated sxs/prior Treatment) HPI  This is an 82 year old male with a history of coronary artery disease, severe mitral regurgitation, CHF who presents with shortness of breath. Patient reports he has some shortness of breath at baseline. He states that it comes and goes. He has had worsening shortness of breath this morning. He denies any chest pain. He feels it is related to his mitral regurgitation. He takes 40 mg of Lasix twice daily and took his normal dosage yesterday. Denies any new leg swelling. Denies any recent coughs or fever.  Patient reports dyspnea on exertion and orthopnea. Per EMS, O2 sats 85% on room air. He is not on home oxygen.  Past Medical History  Diagnosis Date  . Prostate cancer ([place])   . CAD (coronary artery disease)     Circumflex MI 1995, Last cath 2008  . Mitral regurgitation     Severe  . HLD (hyperlipidemia)   . HTN (hypertension)   . Atrial fibrillation ([place])   . Pulmonary hypertension ([place])   . Presence of permanent cardiac pacemaker 10/16/2006,09/04/12    Medtronic adapta  . Chronic anticoagulation   . Dysrhythmia     ATRIAL FIBRILATION  . Pacemaker   . CHF (congestive heart failure) [facility])    Past Surgical History  Procedure Laterality Date  . Permanent pacemaker insertion  10/16/2006    Medtronic Enrhythm  . Permanent pacemaker generator change  09/04/2012    Medtronic adapta  . Coronary angioplasty  09/16/1993    100% CX-successful  . Cardiac catheterization  05/11/2002    70-80% small first diagonal,50% RCA  . Cardiac catheterization  10/14/2006    multivessell,EF 40-50%  . Nm myoview ltd  08/30/2009    mod. perfusion defect-mid inferior,apical inferior,basal inferolateral,mid inferolateral &  apical lateral regions  . US echocardiography  03/27/2012    severe MR persists w/progressive LA dilatation,RSVP has increased & AS has progressed  . Permanent pacemaker generator change N/A 09/04/2012    Procedure: PERMANENT PACEMAKER GENERATOR CHANGE;  Surgeon: Sanda Klein, MD;  Location: [facility]ATH [facility];  Service: Cardiovascular;  Laterality: N/A;  . Cholecystectomy     Family History  Problem Relation Age of Onset  . Diabetes Sister    Social History  Substance Use Topics  . Smoking status: Never Smoker   . Smokeless tobacco: Never Used  . Alcohol Use: No    Review of Systems  Constitutional: Negative.  Negative for fever.  Respiratory: Positive for shortness of breath. Negative for chest tightness.   Cardiovascular: Positive for leg swelling. Negative for chest pain.  Gastrointestinal: Negative.  Negative for abdominal pain.  Genitourinary: Negative.  Negative for dysuria.  All other systems reviewed and are negative.     Allergies  Codeine  Home Medications   Prior to Admission medications   Medication Sig Start Date End Date Taking? Authorizing Provider  acetaminophen (TYLENOL) 500 MG tablet Take 500-1,000 mg by mouth every 6 (six) hours as needed for moderate pain.    Historical Provider, MD  aspirin 81 MG tablet Take 81 mg by mouth daily.    Historical Provider, MD  bicalutamide (CASODEX) 50 MG tablet Take 1 tablet (50 mg total) by mouth daily. 03/08/15   Carolan Clines, MD  feeding supplement,  ENSURE ENLIVE, (ENSURE ENLIVE) LIQD Take 237 mLs by mouth 2 (two) times daily between meals. 03/08/15   Carolan Clines, MD  furosemide (LASIX) 40 MG tablet Take 40 mg by mouth daily.    Historical Provider, MD  hydrocortisone cream 1 % Apply 1 application topically daily as needed for itching (psoriasis).    Historical Provider, MD  sacubitril-valsartan (ENTRESTO) 24-26 MG Take 1 tablet by mouth 2 (two) times daily. 08/11/15   Troy Sine, MD  simvastatin (ZOCOR) 20 MG  tablet TAKE 1 TABLET (20 MG TOTAL) BY MOUTH AT BEDTIME. 05/03/15   Mihai Croitoru, MD  warfarin (COUMADIN) 1 MG tablet Take 1-2 tablets by mouth daily as directed 08/22/15   Troy Sine, MD   BP 122/92 mmHg  Pulse 71  Temp(Src) 97.9 F (36.6 C) (Rectal)  Resp 18  Ht 6\' 2"  (1.88 m)  Wt 170 lb (77.111 kg)  BMI 21.82 kg/m2  SpO2 99% Physical Exam  Constitutional: He is oriented to person, place, and time. No distress.  Elderly, chronically ill-appearing, no acute distress  HENT:  Head: Normocephalic and atraumatic.  Cardiovascular: Normal rate and regular rhythm.   Murmur heard. Pulmonary/Chest: Effort normal and breath sounds normal. No respiratory distress. He has no wheezes.  Decreased BS RLL  Abdominal: Soft. Bowel sounds are normal. There is no tenderness. There is no rebound.  Musculoskeletal: He exhibits edema.  2+ BLE edema  Lymphadenopathy:    He has no cervical adenopathy.  Neurological: He is alert and oriented to person, place, and time.  Skin: Skin is warm and dry.  Psychiatric: He has a normal mood and affect.  Nursing note and vitals reviewed.   ED Course  Procedures (including critical care time) Labs Review Labs Reviewed  CBC WITH DIFFERENTIAL/PLATELET - Abnormal; Notable for the following:    WBC 14.0 (*)    Neutro Abs 11.5 (*)    Monocytes Absolute 1.4 (*)    All other components within normal limits  COMPREHENSIVE METABOLIC PANEL - Abnormal; Notable for the following:    Potassium 3.3 (*)    Chloride 92 (*)    CO2 33 (*)    Glucose, Bld 127 (*)    BUN 22 (*)    ALT 12 (*)    Total Bilirubin 1.6 (*)    GFR calc non Af Amer 57 (*)    All other components within normal limits  PROTIME-INR - Abnormal; Notable for the following:    Prothrombin Time 18.1 (*)    All other components within normal limits  I-STAT CG4 LACTIC ACID, ED - Abnormal; Notable for the following:    Lactic Acid, Venous 2.14 (*)    All other components within normal limits   I-STAT ARTERIAL BLOOD GAS, ED - Abnormal; Notable for the following:    pCO2 arterial 63.8 (*)    Bicarbonate 37.0 (*)    Acid-Base Excess 9.0 (*)    All other components within normal limits  CULTURE, BLOOD (ROUTINE X 2)  CULTURE, BLOOD (ROUTINE X 2)  URINE CULTURE  BRAIN NATRIURETIC PEPTIDE  URINALYSIS, ROUTINE W REFLEX MICROSCOPIC (NOT AT [facility])  Randolm Idol, ED    Imaging Review Dg Chest Portable 1 View  08/28/2015  CLINICAL DATA:  Increasing shortness of breath with exertion over the past several days. Oxygen desaturation. Initial encounter. EXAM: PORTABLE CHEST 1 VIEW COMPARISON:  PA and lateral chest 03/07/2015 and 10/20/2014. FINDINGS: The patient has a moderate to moderately large right pleural effusion with hazy  opacity throughout the right chest. The left lung is clear. There is cardiomegaly. Pacing device is in place. IMPRESSION: Moderate to moderately large right pleural effusion with associated airspace disease which could be due to atelectasis or pneumonia. Electronically Signed   By: Inge Rise M.D.   On: 08/28/2015 07:09   I have personally reviewed and evaluated these images and lab results as part of my medical decision-making.   EKG Interpretation   Date/Time:  Monday August 28 2015 05:46:48 EDT Ventricular Rate:  70 PR Interval:  240 QRS Duration: 176 QT Interval:  479 QTC Calculation: 517 R Axis:   -85 Text Interpretation:  VENTRICULAR PACED RHYTHM Paired ventricular  premature complexes ST depressions anterior leads Confirmed by HORTON  MD,  COURTNEY ([id number]) on 08/28/2015 6:37:50 AM      MDM   Final diagnoses:  SOB (shortness of breath)  Hypoxia  Community acquired pneumonia  Acute pulmonary edema ([place])    Patient presents with shortness of breath. Requiring 4 L of nasal cannula to maintain oxygen saturations greater than 90%. No history of home oxygen use. Does have a history of mitral regurgitation and CHF. He is otherwise nontoxic.  Afebrile. Decreased breath sounds on the right. Does appear mildly volume overloaded with bilateral lower extremity edema. He has evidence of chronic respiratory failure on his ABG. Leukocytosis to 14.  Mild lactic acidosis to 2.14 without hypotension.  Chest x-ray with a moderate to large right pleural effusion concerning for possible pneumonia. Blood cultures obtained. Patient was given vancomycin and Zosyn given the extent of his chest x-ray findings and oxygen requirement. He technically has no risk factors for health care acquired pneumonia. Will give 40 mg of Lasix given evidence of volume overload.     Merryl Hacker, MD 08/28/15 [phone]  Pacer interrogation with no acute events.  Merryl Hacker, MD 08/28/15 [phone]

## 2015-08-28 NOTE — Progress Notes (Signed)
ANTICOAGULATION CONSULT NOTE - Initial Consult  Pharmacy Consult for warfarin Indication: atrial fibrillation  Allergies  Allergen Reactions  . Codeine Nausea And Vomiting    Patient Measurements: Height: 6\' 2"  (188 cm) Weight: 170 lb (77.111 kg) IBW/kg (Calculated) : 82.2  Vital Signs: Temp: 97.9 F (36.6 C) (03/13 0613) Temp Source: Rectal (03/13 [id number]) BP: 131/82 mmHg (03/13 0715) Pulse Rate: 75 (03/13 0715)  Labs:  Recent Labs  08/28/15 0558  HGB 14.2  HCT 43.7  PLT 287  LABPROT 18.1*  INR 1.49  CREATININE 1.14    Estimated Creatinine Clearance: 52.6 mL/min (by C-G formula based on Cr of 1.14).   Medical History: Past Medical History  Diagnosis Date  . Prostate cancer ([place])   . CAD (coronary artery disease)     Circumflex MI 1995, Last cath 2008  . Mitral regurgitation     Severe  . HLD (hyperlipidemia)   . HTN (hypertension)   . Atrial fibrillation ([place])   . Pulmonary hypertension ([place])   . Presence of permanent cardiac pacemaker 10/16/2006,09/04/12    Medtronic adapta  . Chronic anticoagulation   . Dysrhythmia     ATRIAL FIBRILATION  . Pacemaker   . CHF (congestive heart failure) ([facility])     Assessment: 44 yom admitted with SOB. Afib, continuing on warfarin (pta) per pharmacy. INR 1.49 on admit. CBC wnl. Per patient's wife, dose recently reduced with INR>10.  PTA warfarin dose: 1mg  daily per wife (last dose pta 3/12)  Goal of Therapy:  INR 2-3 Monitor platelets by anticoagulation protocol: Yes   Plan:  Warfarin 2mg  x 1 dose tonight Daily INR Monitor s/sx bleeding  Elicia Lamp, PharmD, BCPS Clinical Pharmacist Pager [phone] 08/28/2015 8:02 AM

## 2015-08-28 NOTE — ED Notes (Addendum)
Spoke with pt's wife-- [phone] -- shirley-- will come up later today-- call if she is needed to come in sooner.

## 2015-08-28 NOTE — H&P (Signed)
Date: 08/28/2015               Patient Name:  Norman Lee MRN: [id number]  DOB: 18-Jun-1928 Age / Sex: 82 y.o., male   PCP: Cletis Athens, MD         Medical Service: Internal Medicine Teaching Service         Attending Physician: Dr. Truman Hayward, MD    First Contact: Dr. Merrilyn Puma Pager: [phone]  Second Contact: Dr. 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    History limited by pt's somnolence.   He reports having progressive dyspnea for the past year that worsened two days ago with associated orthopnea and DOE with stable chronic LE edema. He reports taking lasix 40 mg BID and denies dietary indiscretion or weight gain. He denies fever, chills, chest pain, palpitations, syncope, lightheadedness, cough, wheezing, or URI symptoms. He was recently seen on 08/13/15 by his cardiologist Dr. Claiborne Billings who recommended increasing lasix to 80 mg for a few days and then increase his lasix 40 mg daily to BID. He was also started on entresto (was previously on ramipril) at that time. He has history of combined CHF and severe mitral regurgitation which he has declined repair in the past. His last 2D-echo on 10/16/13 revealed EF 55-60% with moderate AS and moderate to severe MR with severely calcified leaflets. He is on chronic coumadin for afib with subtherapeutic INR of 1.49 on admission. His last cardiac catherization in 2008 revealed 70% eccentric mid right artery stenosis, 30% LAD stenosis, 20-30%  residual circumflex disease, and 50-60% AV groove blockage.   On EMS arrival he was hypoxic to 85% requiring 4L of oxygen via [place]. ABG on admission revealed pH 7.37, pO2 85, and pCO2 63.8. He is not on oxygen at home. Portable chest xray revealed moderate to large right pleural effusion with associated airspace disease. His last chest xray in 02/2015 revealed small bilateral pleural effusions. Lactic acid was mildly elevated at  2.14 with WBC of 14K. BNP was elevated at 1748.7 with prior 623.5 on 08/11/15 before entresto was started. His pacemaker is to be interrogated.   He is full code.   Meds:  No current facility-administered medications on file prior to encounter.   Current Outpatient Prescriptions on File Prior to Encounter  Medication Sig Dispense Refill  . acetaminophen (TYLENOL) 500 MG tablet Take 500-1,000 mg by mouth every 6 (six) hours as needed for moderate pain.    Marland Kitchen aspirin 81 MG tablet Take 81 mg by mouth daily.    . bicalutamide (CASODEX) 50 MG tablet Take 1 tablet (50 mg total) by mouth daily. 30 tablet 5  . feeding supplement, ENSURE ENLIVE, (ENSURE ENLIVE) LIQD Take 237 mLs by mouth 2 (two) times daily between meals. 237 mL 12  . furosemide (LASIX) 40 MG tablet Take 40 mg by mouth daily.    . hydrocortisone cream 1 % Apply 1 application topically daily as needed for itching (psoriasis).    . sacubitril-valsartan (ENTRESTO)  24-26 MG Take 1 tablet by mouth 2 (two) times daily. 28 tablet 0  . simvastatin (ZOCOR) 20 MG tablet TAKE 1 TABLET (20 MG TOTAL) BY MOUTH AT BEDTIME. 90 tablet 2  . warfarin (COUMADIN) 1 MG tablet Take 1-2 tablets by mouth daily as directed 60 tablet 2     Allergies: Allergies as of 08/28/2015 - Review Complete 08/28/2015  Allergen Reaction Noted  . Codeine Nausea And Vomiting 09/04/2012   Past Medical History  Diagnosis Date  . Prostate cancer ([place])   . CAD (coronary artery disease)     Circumflex MI 1995, Last cath 2008  . Mitral regurgitation       Severe  . HLD (hyperlipidemia)   . HTN (hypertension)   . Atrial fibrillation ([place])   . Pulmonary hypertension ([place])   . Presence of permanent cardiac pacemaker 10/16/2006,09/04/12    Medtronic adapta  . Chronic anticoagulation   . Dysrhythmia     ATRIAL FIBRILATION  . Pacemaker   . CHF (congestive heart failure) [facility])    Past Surgical History  Procedure Laterality Date  . Permanent pacemaker insertion  10/16/2006    Medtronic Enrhythm  . Permanent pacemaker generator change  09/04/2012    Medtronic adapta  . Coronary angioplasty  09/16/1993    100% CX-successful  . Cardiac catheterization  05/11/2002    70-80% small first diagonal,50% RCA  . Cardiac catheterization  10/14/2006    multivessell,EF 40-50%  . Nm myoview ltd  08/30/2009    mod. perfusion defect-mid inferior,apical inferior,basal inferolateral,mid inferolateral & apical lateral regions  . US echocardiography  03/27/2012    severe MR persists w/progressive LA dilatation,RSVP has increased & AS has progressed  . Permanent pacemaker generator change N/A 09/04/2012    Procedure: PERMANENT PACEMAKER GENERATOR CHANGE;  Surgeon: Sanda Klein, MD;  Location: [place] CATH LAB;  Service: Cardiovascular;  Laterality: N/A;  . Cholecystectomy     Family History  Problem Relation Age of Onset  . Diabetes Sister    Social History   Social History  . Marital Status: Married    Spouse Name: [name]  . Number of Children: 1  . Years of Education: Doctorate   Occupational History  . Not on file.   Social History Main Topics  . Smoking status: Never Smoker   . Smokeless tobacco: Never Used  . Alcohol Use: No  . Drug Use: No  . Sexual Activity: Not on file   Other Topics Concern  . Not on file   Social History Narrative   Lives at home with wife.    Caffeine use: drinks coffee (2-3 cups per day)    Review of Systems: Review of Systems  Constitutional: Negative for fever and chills.  HENT: Negative for congestion.    Eyes: Negative for blurred vision.  Respiratory: Positive for shortness of breath. Negative for cough, sputum production and wheezing.   Cardiovascular: Positive for orthopnea and leg swelling (chronic b/l LE). Negative for chest pain and palpitations.  Gastrointestinal: Positive for diarrhea. Negative for nausea, vomiting, abdominal pain and constipation.  Genitourinary: Negative for dysuria and hematuria.  Musculoskeletal: Negative for myalgias.  Skin: Positive for rash (b/l LE).  Neurological: Negative for dizziness and headaches.      Physical Exam: Blood pressure 131/82, pulse 75, temperature 97.9 F (36.6 C), temperature source Rectal, resp. rate 22, height 6\' 2"  (1.88 m), weight 170 lb (77.111 kg), SpO2 97 %.  Physical Exam  Constitutional: He is oriented to person, place, and time.  He appears well-developed and well-nourished. No distress.  Somnolent  HENT:  Head: Normocephalic and atraumatic.  Right Ear: External ear normal.  Left Ear: External ear normal.  Nose: Nose normal.  Mouth/Throat: Oropharynx is clear and moist. No oropharyngeal exudate.  Eyes: Conjunctivae and EOM are normal. Pupils are equal, round, and reactive to light. Right eye exhibits no discharge. Left eye exhibits no discharge. No scleral icterus.  Neck: Normal range of motion. Neck supple. JVD present.  Cardiovascular: Normal rate and regular rhythm.   Murmur (holosystolic best heard over apex) heard. Pacemaker embedded in left upper chest   Pulmonary/Chest: Effort normal. No respiratory distress. He has no wheezes.  Unable to listen to posterior breath sounds. Anterior lung fields with ronchi. On 4 L [place].  Abdominal: Soft. Bowel sounds are normal. He exhibits no distension. There is no tenderness. There is no rebound and no guarding.  Musculoskeletal: Normal range of motion. He exhibits edema (+2 b/l LE). He exhibits no tenderness.  Neurological: He is alert and oriented to person, place, and time.   Arouses briefly then falls back asleep. Able to answer questions briefly and follows commands.   Skin: Skin is warm and dry. He is not diaphoretic.  Multiple scrapes on b/l LE  Psychiatric: He has a normal mood and affect. His behavior is normal. Judgment and thought content normal.     Lab results: Basic Metabolic Panel:  Recent Labs  08/28/15 0558  NA 139  K 3.3*  CL 92*  CO2 33*  GLUCOSE 127*  BUN 22*  CREATININE 1.14  CALCIUM 9.9   Liver Function Tests:  Recent Labs  08/28/15 0558  AST 23  ALT 12*  ALKPHOS 97  BILITOT 1.6*  PROT 7.6  ALBUMIN 3.8   CBC:  Recent Labs  08/28/15 0558  WBC 14.0*  NEUTROABS 11.5*  HGB 14.2  HCT 43.7  MCV 96.3  PLT 287   Cardiac Enzymes: i-stat  0.05  BNP: 1748.7  Coagulation:  Recent Labs  08/28/15 0558  LABPROT 18.1*  INR 1.49     Imaging results:  Dg Chest Portable 1 View  08/28/2015  CLINICAL DATA:  Increasing shortness of breath with exertion over the past several days. Oxygen desaturation. Initial encounter. EXAM: PORTABLE CHEST 1 VIEW COMPARISON:  PA and lateral chest 03/07/2015 and 10/20/2014. FINDINGS: The patient has a moderate to moderately large right pleural effusion with hazy opacity throughout the right chest. The left lung is clear. There is cardiomegaly. Pacing device is in place. IMPRESSION: Moderate to moderately large right pleural effusion with associated airspace disease which could be due to atelectasis or pneumonia. Electronically Signed   By: Inge Rise M.D.   On: 08/28/2015 07:09    Other results: EKG:   Ventricular Rate: 70 PR Interval: 240 QRS Duration: 176 QT Interval: 479 QTC Calculation: 517 R Axis: -85 Text Interpretation: VENTRICULAR PACED RHYTHM Paired ventricular premature complexes ST depressions anterior leads   Assessment & Plan by Problem:  Acute hypoxic/hypercapnic respiratory failure in setting of severe mitral regurgitation, moderate/large right  pleural effusion, and acute on chronic combined CHF - Pt with worsening orthopnea and DOE with chronic LE edema despite compliance with taking lasix 40 mg BID and no dietary indiscretion or weight gain. On EMS arrival he was hypoxic to 85% on RA requiring 4L of oxygen via [place] and ABG on admission revealed acute hypercapnia. He is not on oxygen at home. Portable chest xray revealed moderate to large right pleural effusion with  associated airspace disease. He denies fever, chills, cough, or URI symptoms to suggest pneumonia however did have mild leukocytosis on admission. He denies chest pain with negative troponin x1 on admission and 12-lead EKG with no ischemic changes. His last 2D-echo on 10/16/13 revealed EF 55-60% with moderate AS and moderate to severe MR with severely calcified leaflets. He has declined mitral valve repair the past. BNP elevated at 1748.7 from 623.5 on 08/11/15 however newly started entresto can elevate levels.  -Monitor on telemetry  -Oxygen therapy to keep SpO2 >92%  -Start NIMV in setting of hypercapnia and somnolence, if decompensates my need IMV -Start IV lasix 80 mg BID and reassess (at home on lasix 40 mg BID) -Obtain 2D-echo and trend troponins  -2 g sodium restricted diet with 2L fluid restriction  -Monitor BMP and magnesium in setting of IV diuresis -2-view chest xray in AM to reassess right sided pleural effusion, if not improved with IV lasix may need thoracentesis  -Follow-up blood cultures drawn in ED, will not start antibiotics as this time due to no concern for pneumonia   -Resume home entresto 24-26 mg BID -Will consult cardiology/CHF team who he follows with as outpatient (Dr. Claiborne Billings is his cardiologist) -PT and OT before discharge   Valvular Atrial fibrillation - Pt with paced rhythm and normal rate. INR subtherapeutic at 1.49 on admission.  -Coumadin per pharmacy -Daily INR's -Would discontinue home aspirin 81 mg daily as there is no active CAD   Hypertension -  Currently normotensive.  -Resume home entresto 24-26 mg BID  Hyperlipidemia - Last lipid panel on  07/31/15 with LDL 43.  -Resume home simvastatin 20 mg daily   History of prostate cancer s/p cryoablation in 2011 - Last PSA level normal (1.1) on 08/07/15. Pt denies urinary symptoms or hematuria.  -Pt follows with Dr. Gaynelle Arabian    Diet: 2 g salt restricted DVT Ppx: Coumadin  Code: Full   Dispo: Disposition is deferred at this time, awaiting improvement of current medical problems. Anticipated discharge in approximately 2-4 day(s).   The patient does have a current PCP Cletis Athens, MD) and does need an [facility] hospital follow-up appointment after discharge.  The patient does not have transportation limitations that hinder transportation to clinic appointments.  Signed: Juluis Mire, MD 08/28/2015, 8:09 AM

## 2015-08-28 NOTE — ED Notes (Signed)
Pt was incontinent of large amount of urine, condom catheter found in bed, not on pt. Pt pulled bipap off, placed on 2L/m/[place] -- saturation is at 100% -- pt continually saying --" Lets go, I want to get up, " encouraged to stay in the bed, Dr. C from cardiology at bedside.

## 2015-08-28 NOTE — ED Notes (Signed)
Pt. arrived with EMS from home reports worsening SOB for the past several days worse with exertion , EMS reported o2 sat-= 85% room air , denies cough / no fever or chills.

## 2015-08-28 NOTE — Consult Note (Signed)
  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 He is extremely somnolent with worsening hypercapnia.   He only would wake up when RN held his face up and yelled at him to his face. He would not awake at all for me other than minor movements with noxious stimuli though I did not perform a hard sternal rub.  I spoke with his wife [name] who had also had conversations with Dr. Merrilyn Puma.  I descibed the conversation with Dr. Orene Desanctis with [name] and she affirmed that her husband ABSOLUTELY would NOT want to be intubated or have CPR performed.   I am therefore chaning his code status to DNR/DNI. His wife is en route to the hospital and getting in touch with her son.  I expect we will need to turn for pure palliative approach as I do not see a likely positive outcome here given his trajectory and given the big picture of his clinical condition.    Truman Hayward, MD 08/28/2015, 3:00 PM

## 2015-08-28 NOTE — ED Notes (Signed)
Portable chest xray in progress.

## 2015-08-28 NOTE — Consult Note (Signed)
Reason for Consult: CHF  Requesting Physician: Tommy Medal  Cardiologist: Claiborne Billings  HPI: This is a 82 y.o. 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 Presents with hypoxia/hypercapnia/encephalopathy due to a large right pleural effusion and CHF; some improvement after furosemide, but still confused.  Very difficult to obtain reliable review oif systems due to his mental status.  PMHx:  Past Medical History  Diagnosis Date  . Prostate cancer ([place])   . CAD (coronary artery disease)     Circumflex MI 1995, Last cath 2008  . Mitral regurgitation     Severe  . HLD (hyperlipidemia)   . HTN (hypertension)   . Atrial fibrillation ([place])   . Pulmonary hypertension ([place])   . Presence of permanent cardiac pacemaker 10/16/2006,09/04/12    Medtronic adapta  . Chronic anticoagulation   . Dysrhythmia     ATRIAL FIBRILATION  . Pacemaker   . CHF (congestive heart failure) [facility])    Past Surgical History  Procedure Laterality Date  . Permanent pacemaker insertion  10/16/2006    Medtronic Enrhythm  . Permanent pacemaker generator change  09/04/2012    Medtronic adapta  . Coronary angioplasty  09/16/1993    100% CX-successful  . Cardiac catheterization  05/11/2002    70-80% small first diagonal,50% RCA  . Cardiac catheterization  10/14/2006    multivessell,EF 40-50%  . Nm myoview ltd  08/30/2009    mod. perfusion defect-mid inferior,apical inferior,basal inferolateral,mid inferolateral & apical lateral regions  . US echocardiography  03/27/2012    severe MR persists w/progressive LA dilatation,RSVP has increased & AS has progressed  . Permanent pacemaker generator change N/A 09/04/2012    Procedure: PERMANENT PACEMAKER GENERATOR CHANGE;  Surgeon: Sanda Klein, MD;  Location: [facility] CATH  LAB;  Service: Cardiovascular;  Laterality: N/A;  . Cholecystectomy      FAMHx: Family History  Problem Relation Age of Onset  . Diabetes Sister     SOCHx:  reports that he has never smoked. He has never used smokeless tobacco. He reports that he does not drink alcohol or use illicit drugs.  ALLERGIES: Allergies  Allergen Reactions  . Codeine Nausea And Vomiting    ROS: Review of systems not obtained due to patient factors.  HOME MEDICATIONS: No current facility-administered medications on file prior to encounter.   Current Outpatient Prescriptions on File Prior to Encounter  Medication Sig Dispense Refill  . acetaminophen (TYLENOL) 500 MG tablet Take 500-1,000 mg by mouth every 6 (six) hours as needed for moderate pain.    Marland Kitchen aspirin 81 MG tablet Take 81 mg by mouth daily.    . bicalutamide (CASODEX) 50 MG tablet Take 1 tablet (50 mg total) by mouth daily. 30 tablet 5  . feeding supplement, ENSURE ENLIVE, (ENSURE ENLIVE) LIQD Take 237 mLs by mouth 2 (two) times daily between meals. 237 mL 12  . furosemide (LASIX) 40 MG tablet Take 40 mg by mouth daily.    . hydrocortisone cream 1 % Apply 1 application topically daily as needed for itching (psoriasis).    . sacubitril-valsartan (ENTRESTO) 24-26 MG Take 1 tablet by mouth 2 (two) times daily. 28 tablet 0  . simvastatin (ZOCOR) 20 MG tablet TAKE 1 TABLET (20 MG TOTAL) BY MOUTH AT BEDTIME. 90 tablet 2  . warfarin (COUMADIN) 1 MG tablet  Take 1-2 tablets by mouth daily as directed 60 tablet 2    HOSPITAL MEDICATIONS: I have reviewed the patient's current medications. Scheduled: . feeding supplement (ENSURE ENLIVE)  237 mL Oral BID BM  . furosemide  80 mg Intravenous BID  . sacubitril-valsartan  1 tablet Oral BID  . simvastatin  20 mg Oral q1800    VITALS: Blood pressure 111/67, pulse 69, temperature 97.9 F (36.6 C), temperature source Rectal, resp. rate 0, height 6\' 2"  (1.88 m), weight 77.111 kg (170 lb), SpO2 98  %.  PHYSICAL EXAM:  General: Alert, disoriented, mild distress Head: no evidence of trauma, PERRL, EOMI, no exophtalmos or lid lag, no myxedema, no xanthelasma; normal ears, nose and oropharynx Neck: normal jugular venous pulsations and no hepatojugular reflux; brisk carotid pulses without delay and no carotid bruits Chest: clear to auscultation, no signs of consolidation by percussion or palpation, normal fremitus, symmetrical and full respiratory excursions Cardiovascular: normal position and quality of the apical impulse, regular rhythm, normal first and paradoxically split second heart sounds, grade 3/6 holosystolic apical murmur radiating deep towards the axilla as well as towards the base of the heart, faint diastolic rumble at the apex, possible third heart sound (heart to distinguish from split second heart sound).  Abdomen: no tenderness or distention, no masses by palpation, no abnormal pulsatility or arterial bruits, normal bowel sounds, no hepatosplenomegaly Extremities: no clubbing, cyanosis; Symmetrical 3+ deep pitting bilateral edema to the knees; 2+ radial, ulnar and brachial pulses bilaterally; 2+ right femoral, posterior tibial and dorsalis pedis pulses; 2+ left femoral, posterior tibial and dorsalis pedis pulses; no subclavian or femoral bruits Neurological: grossly nonfocal Psych: unable to assess. Small stage 2 sacral decubitus ulcer, 1 cm.   LABS  CBC  Recent Labs  08/28/15 0558  WBC 14.0*  NEUTROABS 11.5*  HGB 14.2  HCT 43.7  MCV 96.3  PLT [id number]   Basic Metabolic Panel  Recent Labs  08/28/15 0558 08/28/15 0750  NA 139  --   K 3.3*  --   CL 92*  --   CO2 33*  --   GLUCOSE 127*  --   BUN 22*  --   CREATININE 1.14  --   CALCIUM 9.9  --   MG  --  1.9   Liver Function Tests  Recent Labs  08/28/15 0558  AST 23  ALT 12*  ALKPHOS 97  BILITOT 1.6*  PROT 7.6  ALBUMIN 3.8      IMAGING: Dg Chest Portable 1 View  08/28/2015  CLINICAL DATA:   Increasing shortness of breath with exertion over the past several days. Oxygen desaturation. Initial encounter. EXAM: PORTABLE CHEST 1 VIEW COMPARISON:  PA and lateral chest 03/07/2015 and 10/20/2014. FINDINGS: The patient has a moderate to moderately large right pleural effusion with hazy opacity throughout the right chest. The left lung is clear. There is cardiomegaly. Pacing device is in place. IMPRESSION: Moderate to moderately large right pleural effusion with associated airspace disease which could be due to atelectasis or pneumonia. Electronically Signed   By: Inge Rise M.D.   On: 08/28/2015 07:09    ECG: Mostly V paced rhythm, a couple of wide complex beats likely represent a ventricular couplet  TELEMETRY: atrial fibrillation with intermittent V pacing  IMPRESSION/RECOMMENDATION:  1. Acute pulmonary edema / Acute on chronic combined systolic and diastolic heart failure due to combination of valvular heart disease and ischemic cardiomyopathy. Most recent EF 55-60% in 2015, but 40-50% in the past. Needs additional  diuresis. Continue Entresto as long as stable renal function and BP allow. 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 No clear reason to suspect a new acute coronary event, but ECG not useful for diagnosis. 4.Aortic stenosis, at least moderate. Repeat echo is ordered. 5. Permanent atrial fibrillation with slow ventricular response on warfarin anticoagulation. Avoid any negative inotropes to reduce need for pacing 6. Dual-chamber permanent pacemaker, programmed as single chamber device 7.  Asymptomatic nonsustained ventricular tachycardia, brief and  infrequent, previously recorded on pacemaker 8.  Large right pleural effusion - also has high WBC and could be more than just CHF 9.  Altered mental status - mostly due to hypercapnia/hypoxia; could this be due to infection? Still confused with O2 sat 99%.  When I asked him if he wants to be intubated for respiratory failure, he promptly and vehemently said : "NO!". He answered the same way when I asked the question in a different manner. Since he is still disoriented, I do not think we should rely only on this when making a code status decision. We do need to readdress code status with him after his mental status improves and confirm code status with his wife.   From a cardiac standpoint, long term prognosis is poor, although if LVEF still normal and AS is not severe, he may get over the current acute illness.  Time Spent Directly with Patient: 45 minutes  Sanda Klein, MD, [facility] [phone] office [phone] pager   08/28/2015, 10:53 AM

## 2015-08-29 ENCOUNTER — Inpatient Hospital Stay (HOSPITAL_COMMUNITY): Payer: Medicare Other

## 2015-08-29 DIAGNOSIS — J90 Pleural effusion, not elsewhere classified: Secondary | ICD-10-CM

## 2015-08-29 DIAGNOSIS — R319 Hematuria, unspecified: Secondary | ICD-10-CM

## 2015-08-29 DIAGNOSIS — J9601 Acute respiratory failure with hypoxia: Secondary | ICD-10-CM

## 2015-08-29 DIAGNOSIS — R509 Fever, unspecified: Secondary | ICD-10-CM

## 2015-08-29 DIAGNOSIS — L8990 Pressure ulcer of unspecified site, unspecified stage: Secondary | ICD-10-CM | POA: Insufficient documentation

## 2015-08-29 DIAGNOSIS — I340 Nonrheumatic mitral (valve) insufficiency: Secondary | ICD-10-CM

## 2015-08-29 DIAGNOSIS — R0902 Hypoxemia: Secondary | ICD-10-CM | POA: Insufficient documentation

## 2015-08-29 DIAGNOSIS — J948 Other specified pleural conditions: Secondary | ICD-10-CM

## 2015-08-29 DIAGNOSIS — A419 Sepsis, unspecified organism: Secondary | ICD-10-CM | POA: Insufficient documentation

## 2015-08-29 DIAGNOSIS — I350 Nonrheumatic aortic (valve) stenosis: Secondary | ICD-10-CM

## 2015-08-29 DIAGNOSIS — L89152 Pressure ulcer of sacral region, stage 2: Secondary | ICD-10-CM | POA: Diagnosis not present

## 2015-08-29 DIAGNOSIS — J9602 Acute respiratory failure with hypercapnia: Secondary | ICD-10-CM

## 2015-08-29 DIAGNOSIS — J189 Pneumonia, unspecified organism: Secondary | ICD-10-CM | POA: Insufficient documentation

## 2015-08-29 DIAGNOSIS — J810 Acute pulmonary edema: Secondary | ICD-10-CM

## 2015-08-29 DIAGNOSIS — D72829 Elevated white blood cell count, unspecified: Secondary | ICD-10-CM

## 2015-08-29 DIAGNOSIS — J918 Pleural effusion in other conditions classified elsewhere: Secondary | ICD-10-CM

## 2015-08-29 LAB — URINE CULTURE: Culture: 50000

## 2015-08-29 LAB — CBC WITH DIFFERENTIAL/PLATELET
BASOS PCT: 0 %
Basophils Absolute: 0 10*3/uL (ref 0.0–0.1)
EOS ABS: 0 10*3/uL (ref 0.0–0.7)
Eosinophils Relative: 0 %
HCT: 41.5 % (ref 39.0–52.0)
HEMOGLOBIN: 13.3 g/dL (ref 13.0–17.0)
Lymphocytes Relative: 4 %
Lymphs Abs: 0.6 10*3/uL — ABNORMAL LOW (ref 0.7–4.0)
MCH: 31.3 pg (ref 26.0–34.0)
MCHC: 32 g/dL (ref 30.0–36.0)
MCV: 97.6 fL (ref 78.0–100.0)
Monocytes Absolute: 1.4 10*3/uL — ABNORMAL HIGH (ref 0.1–1.0)
Monocytes Relative: 8 %
NEUTROS PCT: 88 %
Neutro Abs: 15.6 10*3/uL — ABNORMAL HIGH (ref 1.7–7.7)
Platelets: 227 10*3/uL (ref 150–400)
RBC: 4.25 MIL/uL (ref 4.22–5.81)
RDW: 14.5 % (ref 11.5–15.5)
WBC: 17.7 10*3/uL — AB (ref 4.0–10.5)

## 2015-08-29 LAB — URINE MICROSCOPIC-ADD ON

## 2015-08-29 LAB — BASIC METABOLIC PANEL
ANION GAP: 9 (ref 5–15)
BUN: 26 mg/dL — AB (ref 6–20)
CALCIUM: 9.1 mg/dL (ref 8.9–10.3)
CO2: 35 mmol/L — ABNORMAL HIGH (ref 22–32)
Chloride: 96 mmol/L — ABNORMAL LOW (ref 101–111)
Creatinine, Ser: 1.28 mg/dL — ABNORMAL HIGH (ref 0.61–1.24)
GFR calc Af Amer: 58 mL/min — ABNORMAL LOW (ref >60)
GFR, EST NON AFRICAN AMERICAN: 50 mL/min — AB (ref >60)
Glucose, Bld: 161 mg/dL — ABNORMAL HIGH (ref 65–99)
POTASSIUM: 3.9 mmol/L (ref 3.5–5.1)
SODIUM: 140 mmol/L (ref 135–145)

## 2015-08-29 LAB — HIV ANTIBODY (ROUTINE TESTING W REFLEX): HIV SCREEN 4TH GENERATION: NONREACTIVE

## 2015-08-29 LAB — URINALYSIS, ROUTINE W REFLEX MICROSCOPIC
Glucose, UA: NEGATIVE mg/dL
KETONES UR: 15 mg/dL — AB
NITRITE: NEGATIVE
PH: 5 (ref 5.0–8.0)
PROTEIN: 100 mg/dL — AB
Specific Gravity, Urine: 1.012 (ref 1.005–1.030)

## 2015-08-29 LAB — CBC
HEMATOCRIT: 43.3 % (ref 39.0–52.0)
Hemoglobin: 14.3 g/dL (ref 13.0–17.0)
MCH: 32.1 pg (ref 26.0–34.0)
MCHC: 33 g/dL (ref 30.0–36.0)
MCV: 97.1 fL (ref 78.0–100.0)
Platelets: 265 10*3/uL (ref 150–400)
RBC: 4.46 MIL/uL (ref 4.22–5.81)
RDW: 14.5 % (ref 11.5–15.5)
WBC: 20 10*3/uL — AB (ref 4.0–10.5)

## 2015-08-29 LAB — PROTIME-INR
INR: 1.71 — ABNORMAL HIGH (ref 0.00–1.49)
PROTHROMBIN TIME: 20.1 s — AB (ref 11.6–15.2)

## 2015-08-29 LAB — POCT I-STAT 3, ART BLOOD GAS (G3+)
Acid-Base Excess: 12 mmol/L — ABNORMAL HIGH (ref 0.0–2.0)
BICARBONATE: 40.7 meq/L — AB (ref 20.0–24.0)
O2 Saturation: 98 %
PCO2 ART: 76.7 mmHg — AB (ref 35.0–45.0)
PH ART: 7.338 — AB (ref 7.350–7.450)
PO2 ART: 115 mmHg — AB (ref 80.0–100.0)
Patient temperature: 100.6
TCO2: 43 mmol/L (ref 0–100)

## 2015-08-29 LAB — MAGNESIUM: MAGNESIUM: 1.6 mg/dL — AB (ref 1.7–2.4)

## 2015-08-29 LAB — ECHOCARDIOGRAM COMPLETE
Height: 74 in
Weight: 2719.59 oz

## 2015-08-29 MED ORDER — SODIUM CHLORIDE 0.9 % IV SOLN
INTRAVENOUS | Status: AC
  Administered 2015-08-29: 12:00:00 via INTRAVENOUS

## 2015-08-29 MED ORDER — PRO-STAT SUGAR FREE PO LIQD
30.0000 mL | Freq: Three times a day (TID) | ORAL | Status: DC
  Administered 2015-08-30: 10:00:00 via ORAL
  Filled 2015-08-29 (×2): qty 30

## 2015-08-29 MED ORDER — COLLAGENASE 250 UNIT/GM EX OINT
TOPICAL_OINTMENT | Freq: Every day | CUTANEOUS | Status: DC
  Administered 2015-08-29 – 2015-09-01 (×3): via TOPICAL
  Filled 2015-08-29: qty 30

## 2015-08-29 MED ORDER — MAGNESIUM SULFATE 2 GM/50ML IV SOLN
2.0000 g | Freq: Once | INTRAVENOUS | Status: AC
  Administered 2015-08-29: 2 g via INTRAVENOUS
  Filled 2015-08-29: qty 50

## 2015-08-29 MED ORDER — WARFARIN SODIUM 2 MG PO TABS: 2.0000 mg | ORAL_TABLET | Freq: Once | ORAL | Status: DC

## 2015-08-29 MED ORDER — LINEZOLID 600 MG/300ML IV SOLN
600.0000 mg | Freq: Two times a day (BID) | INTRAVENOUS | Status: DC
  Administered 2015-08-29 (×2): 600 mg via INTRAVENOUS
  Filled 2015-08-29 (×4): qty 300

## 2015-08-29 MED ORDER — SODIUM CHLORIDE 0.9 % IV BOLUS (SEPSIS)
250.0000 mL | Freq: Once | INTRAVENOUS | Status: AC
  Administered 2015-08-29: 250 mL via INTRAVENOUS

## 2015-08-29 MED ORDER — FUROSEMIDE 10 MG/ML IJ SOLN: 80.0000 mg | Freq: Two times a day (BID) | INTRAMUSCULAR | Status: DC | PRN

## 2015-08-29 MED ORDER — ADULT MULTIVITAMIN W/MINERALS CH
1.0000 | ORAL_TABLET | Freq: Every day | ORAL | Status: DC
  Administered 2015-08-29 – 2015-08-30 (×2): 1 via ORAL
  Filled 2015-08-29 (×3): qty 1

## 2015-08-29 MED ORDER — CETYLPYRIDINIUM CHLORIDE 0.05 % MT LIQD
7.0000 mL | Freq: Two times a day (BID) | OROMUCOSAL | Status: DC
  Administered 2015-08-30 – 2015-09-01 (×6): 7 mL via OROMUCOSAL

## 2015-08-29 MED ORDER — PIPERACILLIN-TAZOBACTAM 3.375 G IVPB
3.3750 g | Freq: Three times a day (TID) | INTRAVENOUS | Status: DC
  Administered 2015-08-29 – 2015-08-30 (×4): 3.375 g via INTRAVENOUS
  Filled 2015-08-29 (×5): qty 50

## 2015-08-29 MED ORDER — CHLORHEXIDINE GLUCONATE 0.12 % MT SOLN
15.0000 mL | Freq: Two times a day (BID) | OROMUCOSAL | Status: DC
  Administered 2015-08-29 – 2015-09-01 (×5): 15 mL via OROMUCOSAL
  Filled 2015-08-29 (×3): qty 15

## 2015-08-29 NOTE — Progress Notes (Signed)
Patient Name: Norman Lee Date of Encounter: 08/29/2015  Principal Problem:   Acute respiratory failure with hypoxia and hypercapnia ([place]) Active Problems:   Pacemaker - Medtronic Adapta 2014, leads 2008   CAD (coronary artery disease)   Mitral regurgitation, Severe   Prostate cancer ([facility])   HLD (hyperlipidemia)   HTN (hypertension)   A-fib (H[facility])   Anticoagulated on warfarin   Combined systolic and diastolic heart failure ([facility])   Vitamin D deficiency   Acute pulmonary edema ([facility])   Pleural effusion   Goals of care, counseling/discussion   Pressure ulcer   Community acquired pneumonia   Hypoxia   Sepsis ([place])   Length of Stay: 1  SUBJECTIVE  Sleepy, disoriented, but eventually comes around to the right answers.   CURRENT MEDS . antiseptic oral rinse  7 mL Mouth Rinse q12n4p  . chlorhexidine  15 mL Mouth Rinse BID  . collagenase   Topical Daily  . feeding supplement (ENSURE ENLIVE)  237 mL Oral BID BM  . furosemide  80 mg Intravenous BID  . linezolid (ZYVOX) IV  600 mg Intravenous Q12H  . piperacillin-tazobactam (ZOSYN)  IV  3.375 g Intravenous Q8H  . simvastatin  20 mg Oral q1800  . Warfarin - Pharmacist Dosing Inpatient   Does not apply q1800    OBJECTIVE   Intake/Output Summary (Last 24 hours) at 08/29/15 1120 Last data filed at 08/29/15 1047  Gross per 24 hour  Intake   1260 ml  Output   1250 ml  Net     10 ml   Filed Weights   08/28/15 0544 08/29/15 0500  Weight: 77.111 kg (170 lb) 77.1 kg (169 lb 15.6 oz)    PHYSICAL EXAM Filed Vitals:   08/29/15 1003 08/29/15 1015 08/29/15 1050 08/29/15 1100  BP: 73/42 73/42 81/50  85/51  Pulse: 71 70 69 69  Temp:      TempSrc:      Resp: 29 28 23 27   Height:      Weight:      SpO2: 100% 100% 100% 100%   General: Alert, oriented x3, no distress Head: no evidence of trauma, PERRL, EOMI, no exophtalmos or lid lag, no myxedema, no xanthelasma; normal ears, nose and oropharynx Neck: normal jugular venous  pulsations and no hepatojugular reflux; brisk carotid pulses without delay and no carotid bruits Chest: diminished right base, dull to percussion same area, no rales symmetrical and full respiratory excursions Cardiovascular: normal position and quality of the apical impulse, regular rhythm, normal first and paradoxically split second heart sounds, no rubs or gallops, 3/6 apical holosystolic murmur Abdomen: no tenderness or distention, no masses by palpation, no abnormal pulsatility or arterial bruits, normal bowel sounds, no hepatosplenomegaly Extremities: no clubbing, cyanosis or edema; 2+ radial, ulnar and brachial pulses bilaterally; 2+ right femoral, posterior tibial and dorsalis pedis pulses; 2+ left femoral, posterior tibial and dorsalis pedis pulses; no subclavian or femoral bruits Neurological: grossly nonfocal  LABS  CBC  Recent Labs  08/28/15 0558 08/29/15 0006 08/29/15 0502  WBC 14.0* 20.0* 17.7*  NEUTROABS 11.5*  --  15.6*  HGB 14.2 14.3 13.3  HCT 43.7 43.3 41.5  MCV 96.3 97.1 97.6  PLT 287 265 [id number]   Basic Metabolic Panel  Recent Labs  08/28/15 0558 08/28/15 0750 08/29/15 0502  NA 139  --  140  K 3.3*  --  3.9  CL 92*  --  96*  CO2 33*  --  35*  GLUCOSE 127*  --  161*  BUN 22*  --  26*  CREATININE 1.14  --  1.28*  CALCIUM 9.9  --  9.1  MG  --  1.9 1.6*   Liver Function Tests  Recent Labs  08/28/15 0558  AST 23  ALT 12*  ALKPHOS 97  BILITOT 1.6*  PROT 7.6  ALBUMIN 3.8   No results for input(s): LIPASE, AMYLASE in the last 72 hours. Cardiac Enzymes  Recent Labs  08/28/15 1235 08/28/15 1828  TROPONINI 0.06* 0.05*   Radiology Studies Imaging results have been reviewed and Dg Chest Port 1 View  08/29/2015  CLINICAL DATA:  82 year old male with shortness of breath. Right pleural effusion. Initial encounter. EXAM: PORTABLE CHEST 1 VIEW COMPARISON:  08/28/2015 and earlier. FINDINGS: Portable AP upright view at 0642 hours. Small to moderate dependent  right pleural effusion appears stable. Underlying pulmonary vascularity has regressed. Stable cardiomegaly and mediastinal contours. Stable left chest cardiac pacemaker. No pneumothorax. No areas of worsening ventilation. IMPRESSION: 1. Stable small to moderate right pleural effusion with associated right lung base collapse or consolidation. 2. Interval regressed pulmonary vascular congestion. 3. No new cardiopulmonary abnormality. Electronically Signed   By: Genevie Ann M.D.   On: 08/29/2015 08:29   Dg Chest Portable 1 View  08/28/2015  CLINICAL DATA:  Increasing shortness of breath with exertion over the past several days. Oxygen desaturation. Initial encounter. EXAM: PORTABLE CHEST 1 VIEW COMPARISON:  PA and lateral chest 03/07/2015 and 10/20/2014. FINDINGS: The patient has a moderate to moderately large right pleural effusion with hazy opacity throughout the right chest. The left lung is clear. There is cardiomegaly. Pacing device is in place. IMPRESSION: Moderate to moderately large right pleural effusion with associated airspace disease which could be due to atelectasis or pneumonia. Electronically Signed   By: Inge Rise M.D.   On: 08/28/2015 07:09    TELE V paced, underlying atrial fibrillation  ECHO On my review, LVEF 50%, severe biatrial dilation, dilated and mildly depressed RV, severe MR, moderate AS, not much changed from last echo (AS may be worse slightly)  ASSESSMENT AND PLAN  1. Acute on chronic combined systolic and diastolic heart failure due to combination of valvular heart disease and ischemic cardiomyopathy. Most recent EF 55-60% in 2015, but 40-50% in the past. I think he may now be euvolemic, maybe even a little dry. Continue Entresto as long as stable renal function and BP allow. 2.Severe mitral insufficiency due to chordal rupture and flail P2 segment of the posterior leaflet, likely beyond the option for surgical repair 3.Coronary artery disease status post remote  posterior wall myocardial infarction in 1995, His recent coronary angiogram 2008 Showed stenosis in a small first diagonal artery and 50% stenosis in the right coronary artery. No clear reason to suspect a new acute coronary event, but ECG not useful for diagnosis. 4.Aortic stenosis, at least moderate. Repeat echo is ordered. 5. Permanent atrial fibrillation with slow ventricular response on warfarin anticoagulation. Warfarin on hold due to hematuria. INR subtherapeutic. Avoid any negative inotropes to reduce need for pacing 6. Dual-chamber permanent pacemaker, programmed as single chamber device 7. Asymptomatic nonsustained ventricular tachycardia, brief and infrequent, previously recorded on pacemaker 8. Large right pleural effusion - chronic, but larger than before. Also has high WBC and could be more than just CHF. Consider empyema/parapneumnic effusion and possible thoracentesis. 9. Altered mental status - mostly due to hypercapnia/hypoxia; could this also be due to infection? Still confused with O2 sat 99%. Marked hypercapnia on last ABG  Sanda Klein, MD, [facility] [phone] office [phone] pager 08/29/2015 11:20 AM

## 2015-08-29 NOTE — Progress Notes (Signed)
Initial Nutrition Assessment  DOCUMENTATION CODES:   Not applicable  INTERVENTION:   -D/c Ensure Enlive po BID, each supplement provides 350 kcal and 20 grams of protein, due to poor acceptance -30 ml Prostat TID, each supplement provides 100 kcals and 15 grams protein -MVI daily  NUTRITION DIAGNOSIS:   Increased nutrient needs related to wound healing as evidenced by estimated needs.  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 He had recently been started on entrestoHe came via EMS to ED and was hypoxic and hypercapnic with PCO2 initially of 63.8. He had been placed on bipap. His CXR showed new moderate pleural effusion. Labs further pertinnent for elevated BNP, leukocytosis to 14 thousand.   Pt admitted with rt pleural effusion.   Per MD notes, thoracentesis was planned, however, pt refused. MD reports could get clinical competence.   No family in room at time of visit. Pt was sleeping soundly. Per RN, pt is very HOH and difficult to arouse.   Observed lunch tray, which was unattempted. Pt consumed only about 50% of applesauce. He has also been refusing Ensure supplements.   Unable to complete full nutrition focused physical exam at this time. Noted severe depletion on temple, orbital, and clavicle region. This RD suspect malnutrition, however, unable to confirm at this time.   Reviewed CWOCN note from 08/29/15; pt with RLE venous stasis ulcer on unstageable pressure injury to sacrum. Pt with increased nutrient needs related to wound healing.   Labs  reviewed: Mg: 1.6.   Diet Order:  Diet 2 gram sodium Room service appropriate?: Yes; Fluid consistency:: Thin; Fluid restriction:: 2000 mL Fluid  Skin:  Wound (see comment) (UN sacrum, RLE venous stasis ulcer)  Last BM:  PTA  Height:   Ht Readings from Last 1 Encounters:  08/28/15 6\' 2"  (1.88 m)    Weight:   Wt Readings from Last 1 Encounters:  08/29/15 169 lb 15.6 oz (77.1 kg)    Ideal Body Weight:  86.4 kg  BMI:  Body mass index is 21.81 kg/(m^2).  Estimated Nutritional Needs:   Kcal:  2100-2300  Protein:  100-115 grams  Fluid:  2.1-2.3 L  EDUCATION NEEDS:   No education needs identified at this time  Norman Norman, RD, LDN, CDE Pager: [phone] After hours Pager: [phone]

## 2015-08-29 NOTE — Telephone Encounter (Signed)
Left msg for patient to call. 

## 2015-08-29 NOTE — Care Management Note (Signed)
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   Expected Discharge Date:                  Expected Discharge Plan:  [place]  In-House Referral:     Discharge planning Services  CM Consult  Post Acute Care Choice:    Choice offered to:     DME Arranged:    DME Agency:     HH Arranged:    [place] Agency:     Status of Service:     Medicare Important Message Given:    Date Medicare IM Given:    Medicare IM give by:    Date Additional Medicare IM Given:    Additional Medicare Important Message give by:     If discussed at [place] of Stay Meetings, dates discussed:    Additional Comments: ur review done  Lacretia Leigh, RN 08/29/2015, 8:45 AM

## 2015-08-29 NOTE — Progress Notes (Signed)
PT Cancellation Note  Patient Details Name: GLENDEN DANBURY MRN: [id number] DOB: [birth year]   Cancelled Treatment:    Reason Eval/Treat Not Completed: Medical issues which prohibited therapy   Discussed pt with [name], RN, who reports Dr. Hardin Negus is hypotensive enough to need to hold mobilizing and OOB at this time;   Will follow up for appropriateness of PT eval tomorrow;  Thanks,   Roney Marion, PT  Acute Rehabilitation Services Pager [phone] Office [phone]    Roney Marion [facility] 08/29/2015, 10:07 AM

## 2015-08-29 NOTE — Telephone Encounter (Signed)
I suspect the sob is do to his CHF and may not be precipitated by starting entresto

## 2015-08-29 NOTE — Progress Notes (Signed)
ABG done per order. RN to report results.

## 2015-08-29 NOTE — Progress Notes (Signed)
Patient family and patient requested that Bipap be taken off for the time being. Patient started on [place] @ 8L/min and decreased to 4L/min with O2 sats > 90%. Will continue to monitor patient. Gildardo Cranker, RN

## 2015-08-29 NOTE — Progress Notes (Signed)
Pharmacy Antibiotic Note  Norman Lee is a 82 y.o. male admitted on 08/28/2015 with acute respiratory failure.  Pharmacy has been consulted for Zosyn dosing for CAP coverage.  Plan: - Start Zosyn 3.375 gm IV q8h (4-hr infusion) - Start linezolid 600 mg IV q12h per ID to avoid vancomycin nephrotoxicity - Monitor cultures, temperature curve, CBC, clinical course - De-escalate when clinically possible  Height: 6\' 2"  (188 cm) Weight: 169 lb 15.6 oz (77.1 kg) IBW/kg (Calculated) : 82.2  Temp (24hrs), Avg:98.3 F (36.8 C), Min:97.5 F (36.4 C), Max:100.6 F (38.1 C)   Recent Labs Lab 08/28/15 0558 08/28/15 0607 08/29/15 0006 08/29/15 0502  WBC 14.0*  --  20.0* 17.7*  CREATININE 1.14  --   --  1.28*  LATICACIDVEN  --  2.14*  --   --     Estimated Creatinine Clearance: 46.8 mL/min (by C-G formula based on Cr of 1.28).    Allergies  Allergen Reactions  . Codeine Nausea And Vomiting    Antimicrobials this admission: Zosyn 3/14 >> Linezolid per ID 3/14 >>  Dose adjustments this admission: None  Microbiology results: 3/13 BCx: sent 3/14 UCx: sent  3/13 MRSA PCR: negative  Thank you for allowing pharmacy to be a part of this patient's care.  Zyann Mabry L. Nicole Kindred, PharmD PGY2 Infectious Diseases Pharmacy Resident Pager: [phone] 08/29/2015 7:26 AM

## 2015-08-29 NOTE — Progress Notes (Signed)
We are not able to get the patient to agree to the thoracentesis. His wife agreed but he will not let us go forward. Not convinced that he understands risk/benefit.   Plan/rec If feel strong about this could get clinical competence Cont abx We will be available if he re-considers.    Erick Colace ACNP-BC [place] Pager # [phone] OR # [phone] if no answer

## 2015-08-29 NOTE — Progress Notes (Signed)
Echocardiogram 2D Echocardiogram has been performed.  Joelene Millin 08/29/2015, 9:41 AM

## 2015-08-29 NOTE — Progress Notes (Signed)
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 He did spike a temperature of 100.6 overnight. He is mentating clearly this morning, alert and oriented x3 and conversant. He denies respiratory complaints, or other symptoms at this time, including chest pain, palpitations, N/V/D. Hematuria is seen draining from the Foley.  Objective: Vital signs in last 24 hours: Filed Vitals:   08/29/15 0642 08/29/15 0742 08/29/15 0750 08/29/15 0815  BP: 98/60 86/60 84/51    Pulse:  72 71   Temp:    98.8 F (37.1 C)  TempSrc:    Oral  Resp:  27 21   Height:      Weight:      SpO2:  99% 100%    Weight change: -0.4 oz (-0.012 kg)  Intake/Output Summary (Last 24 hours) at 08/29/15 0830 Last data filed at 08/29/15 0600  Gross per 24 hour  Intake    300 ml  Output   1250 ml  Net   -950 ml     Gen: Chronically ill-appearing, alert and oriented to person, place, and time Neck: No JVD noted. CV: Normal rate, regular rhythm, no rubs, or gallops. There is a 2/6 systolic murmur heard best at the R 2nd ICS that radiates to the neck, as well as a 4/6 systolic murmur heard best at the apex that radiates to the axilla. Pulmonary: Normal effort, decreased breath sounds RLL with dullness to percussion, no crackles or wheezes. Abdominal: Soft, non-tender, non-distended, without rebound, guarding, or masses Extremities: Distal pulses 2+ in upper and lower extremities bilaterally, no tenderness, erythema or edema  Lab Results: Basic Metabolic Panel:  Recent Labs Lab 08/28/15 0558 08/28/15 0750 08/29/15 0502  NA 139  --  140  K 3.3*  --  3.9  CL 92*  --  96*  CO2 33*  --  35*  GLUCOSE 127*  --  161*  BUN 22*  --  26*  CREATININE 1.14  --  1.28*  CALCIUM 9.9  --  9.1  MG  --  1.9 1.6*   CBC:  Recent Labs Lab 08/28/15 0558 08/29/15 0006 08/29/15 0502  WBC 14.0* 20.0* 17.7*  NEUTROABS 11.5*  --  15.6*  HGB 14.2 14.3 13.3  HCT 43.7 43.3 41.5  MCV 96.3 97.1 97.6  PLT 287 265  227   Coagulation:  Recent Labs Lab 08/28/15 0558 08/29/15 0502  LABPROT 18.1* 20.1*  INR 1.49 1.71*   Urinalysis:  Recent Labs Lab 08/28/15 0857 08/29/15 0148  COLORURINE AMBER* RED*  LABSPEC 1.015 1.012  PHURINE 5.0 5.0  GLUCOSEU NEGATIVE NEGATIVE  HGBUR NEGATIVE LARGE*  BILIRUBINUR NEGATIVE MODERATE*  KETONESUR NEGATIVE 15*  PROTEINUR NEGATIVE 100*  NITRITE NEGATIVE NEGATIVE  LEUKOCYTESUR TRACE* MODERATE*   Assessment/Plan: 1. Acute hypoxic/hypercapnic respiratory failure in setting of severe MR, combined CHF, large R pleural effusion - he has been improving with intermittent BiPAP, with repeat ABG showing slight improvement in hypercapnea. Soft BPs likely 2/2 diuresis and continuing Entresto, but with effusion, rising leukocytosis and low-grade isolated fever, there is concern for underlying PNA, although this is less likely the cause for his symptoms as his worsening cardiac disease and associated effusion. -Hold Entresto temporarily in the setting of hypotension -Lasix 80mg  IV BID -Started on linezolid, pip/tazo for PNA -Consulted Dr. Lamonte Sakai with PCCM for possible Rt thoracentesis  -Cardiology on board; appreciate thoughtful care of this mutual patient -F/u TTE -Trend BMPs -F/u BCx  2. Hematuria - does have h/o PrCA s/p cryoablation in 2011, with hematuria back  in 02/2015 and rising PSA, so was started on Casodex by his urologist. Most recent PSA last month was normal (1.1), so I question whether this is recurrent prostate cancer vs urethral trauma in the setting of anticoagulation. H and H are stable. -Continue to monitor clinically -Trend CBCs -Follow-up with his urologist outpatient   Dispo: Disposition is deferred at this time, awaiting improvement of current medical problems.   The patient does have a current PCP Cletis Athens, MD) and does need an [facility] hospital follow-up appointment after discharge.  The patient does have transportation limitations that  hinder transportation to clinic appointments.   LOS: 1 day   [name], MD 08/29/2015, 8:30 AM

## 2015-08-29 NOTE — Progress Notes (Addendum)
  Date: 08/29/2015  Patient name: Norman Lee  Medical record number: [id number]  Date of birth: 04-12-1929   This patient's plan of care was discussed with the house staff. Please see their note for complete details. I concur with their findings.  Patient much more alert and fully conversant this am which was after he had again worn BIPAP for some time  I again sought clarity on code status and he again confirmed DNR/DNI  His BP have dropped overnight and he has had fever and WBC has gone up. While his initial PCT was normal I now have increased anxiety for infection in the lung and pleural space  Some of BP drop could be iatrogenic with aggressive diuresis and HTNsives but concern is now for sepsis  We are starting linezolid and pip/tazobactam  We are holding his diuretics and BP meds  We have asked CCM to see for therapeutic and diagnostic thoracentesis but they do not believe effusion is large enough  BP dropped further and we will give 250 ml bolus  We spent greater than 35 minutes with the patient including greater than 50% of time in face to face counsel of the patient re his heart failure, valvular heart disease, pleural effusion and possible CAP as well as goals of care  and in coordination of his care with [place], MD 08/29/2015, 11:03 AM

## 2015-08-29 NOTE — Progress Notes (Signed)
Patient blood pressure starting to decrease. Attempted automatic and manuel BP x 2. Also, change in LOC, and temp 100.6. Notified MD on call and MD arrived to assess patient. Patient still lethargic at this time. STAT ABG ordered and Bipap placed back on patient. Will continue to monitor patient. Gildardo Cranker, RN

## 2015-08-29 NOTE — Progress Notes (Addendum)
ANTICOAGULATION CONSULT NOTE - Follow Up Consult  Pharmacy Consult for coumadin Indication: atrial fibrillation  Allergies  Allergen Reactions  . Codeine Nausea And Vomiting    Patient Measurements: Height: 6\' 2"  (188 cm) Weight: 169 lb 15.6 oz (77.1 kg) IBW/kg (Calculated) : 82.2  Vital Signs: Temp: 98.8 F (37.1 C) (03/14 0815) Temp Source: Oral (03/14 0815) BP: 84/51 mmHg (03/14 0750) Pulse Rate: 71 (03/14 0750)  Labs:  Recent Labs  08/28/15 0558 08/28/15 1235 08/28/15 1828 08/29/15 0006 08/29/15 0502  HGB 14.2  --   --  14.3 13.3  HCT 43.7  --   --  43.3 41.5  PLT 287  --   --  265 227  LABPROT 18.1*  --   --   --  20.1*  INR 1.49  --   --   --  1.71*  CREATININE 1.14  --   --   --  1.28*  TROPONINI  --  0.06* 0.05*  --   --     Estimated Creatinine Clearance: 46.8 mL/min (by C-G formula based on Cr of 1.28).   Medications:  Scheduled:  . antiseptic oral rinse  7 mL Mouth Rinse q12n4p  . chlorhexidine  15 mL Mouth Rinse BID  . feeding supplement (ENSURE ENLIVE)  237 mL Oral BID BM  . furosemide  80 mg Intravenous BID  . linezolid (ZYVOX) IV  600 mg Intravenous Q12H  . piperacillin-tazobactam (ZOSYN)  IV  3.375 g Intravenous Q8H  . simvastatin  20 mg Oral q1800  . warfarin  2 mg Oral ONCE-1800  . Warfarin - Pharmacist Dosing Inpatient   Does not apply q1800    Assessment: 24 yom admitted with SOB. Afib, continuing on warfarin (pta) per pharmacy. INR is 1.71 with trend up (likely in setting of HF). Coumadin was not given 3/13 due to NPO status - diet now ordered. Noted plans for possible palliative approach.  -Hematuria noted on admission and continues today  Home dose 1mg /day  Goal of Therapy:  INR 2-3 Monitor platelets by anticoagulation protocol: Yes   Plan:  -Will hold coumadin in setting of hematuria -Daily PT/INR  Hildred Laser, Pharm D 08/29/2015 9:43 AM

## 2015-08-29 NOTE — Consult Note (Signed)
WOC wound consult note Reason for Consult: leg and sacrum.  Patient reports legs swell and then he gets sores.  He also reports knowledge of sacral ulcer. Family in the room also reports knowledge of sacral ulcer, states "he has had that for a while".  I have explained causative factors for both the LE wound and the sacral ulcer.  Because patient presents with very little LE edema and weak LE pulses and what appears to be arterial like ulcers at toe tip I will not recommend compression therapy at this time.  Wound type: venous stasis ulcer RLE; Unstageable Pressure Injury sacrum Pressure Ulcer POA: Yes Measurement: Sacrum: 5cm x 6cm x 0.1cm  RLE: 4cm x 3cm x 0.2cm  Wound bed: Sacrum: 95% yellow, adherent slough; 5% pink RLE: 95% yellow fibrin/5% pink Drainage (amount, consistency, odor)  Sacrum: minimal, non purulent RLE: heavy, yellow, non purulent, no odor Periwound: Sacrum: non blanchable redness/purple skin;  RLE and LLE with scattered scabs/  RLE 3rd toe with ulcer/scabbed dry Dressing procedure/placement/frequency: Will add low air loss mattress for pressure redistribution for sacral ulcer and moisture management.  Add enzymatic debridement ointment daily to clean up the non viable tissue for the sacral ulcer.   Added silver hydrofiber and foam for exudate management for the LE wound, change every other day.   Discussed POC with patient and bedside nurse.  Re consult if needed, will not follow at this time. Thanks  Norman Lee, [name] [phone])

## 2015-08-29 NOTE — Progress Notes (Signed)
At 1005 pt BP down further than this am when MD's rounded. This am in 80's, now in 70's, 73/42. Paged Dr Merrilyn Puma.   At 1015 NP with CCM has been in to see pt for possible thoracentesis, she states pt does not have enough fluid to remove. Also informed her of pt's low BP, she discussed with CCM MD and will not do any pressors.   At 1025 paged Dr Merrilyn Puma again. At 1032 Dr Merrilyn Puma returned page, informed of low BP's, and discussion with CCM NP as noted above.  No order at this time will discuss with attending and call back.

## 2015-08-29 NOTE — Consult Note (Signed)
Name: Norman Lee MRN: [id number] DOB: Oct 06, 1928    ADMISSION DATE:  08/28/2015 CONSULTATION DATE:  3/14  REFERRING MD :  Tommy Medal  CHIEF COMPLAINT:  Pleural effusion  BRIEF PATIENT DESCRIPTION: Norman Lee is an 82 year old male with CAD, HTN,Hyperlipidemia, afB, Mitral regurgitation, CHF presents to the ER with CAP and now with right sided pleural effussion.  SIGNIFICANT EVENTS   STUDIES:   3/14 ECHO>>  3/14 UC>>  3/14 BC>>  HISTORY OF PRESENT ILLNESS:  Norman Lee is an 82 year old male with PMH significant for CAD, Mitral regurgitation, hypertension, hyperlipidemia, atrial fibrillation, pulmonary hypertension, cardiac pacemaker, chronic anticoagulation, dysrhythmia, pacemaker and CHF who presented to [facility] with worsening shortness of breath and orthopnea. Started getting treated for CAP  With Zyvox and Zosyn. WBC remains elevated andCXR was concerning for small right sided pleural effusion and therefore on 3/14 PCCM team was consulted.  PAST MEDICAL HISTORY :   has a past medical history of Prostate cancer ([place]); CAD (coronary artery disease); Mitral regurgitation; HLD (hyperlipidemia); HTN (hypertension); Atrial fibrillation ([place]); Pulmonary hypertension ([place]); Presence of permanent cardiac pacemaker (10/16/2006,09/04/12); Chronic anticoagulation; Dysrhythmia; Pacemaker; and CHF (congestive heart failure) ([place]).  has past surgical history that includes Permanent pacemaker insertion (10/16/2006); Permanent pacemaker generator change (09/04/2012); Coronary angioplasty (09/16/1993); Cardiac catheterization (05/11/2002); Cardiac catheterization (10/14/2006); NM MYOVIEW LTD (08/30/2009); US ECHOCARDIOGRAPHY (03/27/2012); Permanent pacemaker generator change (N/A, 09/04/2012); and Cholecystectomy. Prior to Admission medications   Medication Sig Start Date End Date Taking? Authorizing Provider  acetaminophen (TYLENOL) 500 MG tablet Take 500-1,000 mg by mouth every 6 (six) hours as needed  for moderate pain.   Yes Historical Provider, MD  aspirin 81 MG tablet Take 81 mg by mouth daily.   Yes Historical Provider, MD  bicalutamide (CASODEX) 50 MG tablet Take 1 tablet (50 mg total) by mouth daily. 03/08/15  Yes Carolan Clines, MD  feeding supplement, ENSURE ENLIVE, (ENSURE ENLIVE) LIQD Take 237 mLs by mouth 2 (two) times daily between meals. 03/08/15  Yes Carolan Clines, MD  furosemide (LASIX) 40 MG tablet Take 40 mg by mouth daily.   Yes Historical Provider, MD  hydrocortisone cream 1 % Apply 1 application topically daily as needed for itching (psoriasis).   Yes Historical Provider, MD  ramipril (ALTACE) 10 MG capsule Take 10 mg by mouth daily.   Yes Historical Provider, MD  simvastatin (ZOCOR) 20 MG tablet TAKE 1 TABLET (20 MG TOTAL) BY MOUTH AT BEDTIME. 05/03/15  Yes Mihai Croitoru, MD  warfarin (COUMADIN) 1 MG tablet Take 1-2 tablets by mouth daily as directed 08/22/15  Yes Troy Sine, MD  sacubitril-valsartan (ENTRESTO) 24-26 MG Take 1 tablet by mouth 2 (two) times daily. Patient not taking: Reported on 08/28/2015 08/11/15   Troy Sine, MD   Allergies  Allergen Reactions  . Codeine Nausea And Vomiting    FAMILY HISTORY:  family history includes Diabetes in his sister. SOCIAL HISTORY:  reports that he has never smoked. He has never used smokeless tobacco. He reports that he does not drink alcohol or use illicit drugs.  REVIEW OF SYSTEMS:   As per HPI  SUBJECTIVE:  Patient remains somnolent but answers questions upon prompting.  He says he doesn't know how he feels.  VITAL SIGNS: Temp:  [97.5 F (36.4 C)-100.6 F (38.1 C)] 98.8 F (37.1 C) (03/14 0815) Pulse Rate:  [66-132] 71 (03/14 0750) Resp:  [11-28] 21 (03/14 0750) BP: (80-132)/(51-81) 84/51 mmHg (03/14 0750) SpO2:  [77 %-100 %]  100 % (03/14 0750) FiO2 (%):  [40 %] 40 % (03/14 0546) Weight:  [169 lb 15.6 oz (77.1 kg)] 169 lb 15.6 oz (77.1 kg) (03/14 0500)  PHYSICAL EXAMINATION: General:  Elderly  white male , somnolent Neuro:  Answers questions on prompt HEENT:  Atraumatic, Normocephalic, no discharge. Cardiovascular:  S1S2, irregular, no MRG Lungs: Diminished bases, no wheezes, crackles, rales Abdomen:soft, nontender, non distended Skin:  Weeping left lowerleg, +1 edema    Recent Labs Lab 08/28/15 0558 08/29/15 0502  NA 139 140  K 3.3* 3.9  CL 92* 96*  CO2 33* 35*  BUN 22* 26*  CREATININE 1.14 1.28*  GLUCOSE 127* 161*    Recent Labs Lab 08/28/15 0558 08/29/15 0006 08/29/15 0502  HGB 14.2 14.3 13.3  HCT 43.7 43.3 41.5  WBC 14.0* 20.0* 17.7*  PLT 287 265 227   Dg Chest Port 1 View  08/29/2015  CLINICAL DATA:  82 year old male with shortness of breath. Right pleural effusion. Initial encounter. EXAM: PORTABLE CHEST 1 VIEW COMPARISON:  08/28/2015 and earlier. FINDINGS: Portable AP upright view at 0642 hours. Small to moderate dependent right pleural effusion appears stable. Underlying pulmonary vascularity has regressed. Stable cardiomegaly and mediastinal contours. Stable left chest cardiac pacemaker. No pneumothorax. No areas of worsening ventilation. IMPRESSION: 1. Stable small to moderate right pleural effusion with associated right lung base collapse or consolidation. 2. Interval regressed pulmonary vascular congestion. 3. No new cardiopulmonary abnormality. Electronically Signed   By: Genevie Ann M.D.   On: 08/29/2015 08:29   Dg Chest Portable 1 View  08/28/2015  CLINICAL DATA:  Increasing shortness of breath with exertion over the past several days. Oxygen desaturation. Initial encounter. EXAM: PORTABLE CHEST 1 VIEW COMPARISON:  PA and lateral chest 03/07/2015 and 10/20/2014. FINDINGS: The patient has a moderate to moderately large right pleural effusion with hazy opacity throughout the right chest. The left lung is clear. There is cardiomegaly. Pacing device is in place. IMPRESSION: Moderate to moderately large right pleural effusion with associated airspace disease which  could be due to atelectasis or pneumonia. Electronically Signed   By: Inge Rise M.D.   On: 08/28/2015 07:09    ASSESSMENT / PLAN: A Right sided pleural effusion, possibly due to cardiogenic edema vs parapneumonic.  Possible R CAP; initial Pct negative, but has had fever, leukocytosis  Acute Hypoxemic / hypercarbic respiratory failure, has required BiPAP intermittently   P Zyvox/ zosyn initiated 3/14, agree with broad coverage Support with O2/BiPAP, follow ABG  Diurese as tolerated. Does not appear to be grossly overloaded at this time.  Will assess by [place] to see if we can perform diagnostic +/- therapeutic R thora   Bincy Varughese,AG-ACNP Pulmonary and [place] Pager: [phone] 08/29/2015, 9:27 AM  Attending Note:  I have examined patient, reviewed labs, studies and notes. I have discussed the case with B. Varughese, and I agree with the data and plans as amended above. Pt with a chronic R effusion, possibly larger based on CXR 3/14, but with fever + leukocytosis. I believe that he would benefit from at least diagnostic thora if we can safely perform. I will look at his R pleural space w [place] and perform if possible.   Baltazar Apo, MD, PhD 08/29/2015, 11:42 AM [place] [facility] [phone] or if no answer [phone]

## 2015-08-30 ENCOUNTER — Inpatient Hospital Stay (HOSPITAL_COMMUNITY): Payer: Medicare Other

## 2015-08-30 DIAGNOSIS — B3749 Other urogenital candidiasis: Secondary | ICD-10-CM

## 2015-08-30 DIAGNOSIS — I959 Hypotension, unspecified: Secondary | ICD-10-CM

## 2015-08-30 DIAGNOSIS — Z960 Presence of urogenital implants: Secondary | ICD-10-CM

## 2015-08-30 LAB — BASIC METABOLIC PANEL
ANION GAP: 7 (ref 5–15)
BUN: 30 mg/dL — ABNORMAL HIGH (ref 6–20)
CALCIUM: 8.9 mg/dL (ref 8.9–10.3)
CO2: 36 mmol/L — AB (ref 22–32)
CREATININE: 1.52 mg/dL — AB (ref 0.61–1.24)
Chloride: 96 mmol/L — ABNORMAL LOW (ref 101–111)
GFR, EST AFRICAN AMERICAN: 47 mL/min — AB (ref >60)
GFR, EST NON AFRICAN AMERICAN: 40 mL/min — AB (ref >60)
Glucose, Bld: 119 mg/dL — ABNORMAL HIGH (ref 65–99)
Potassium: 4.4 mmol/L (ref 3.5–5.1)
SODIUM: 139 mmol/L (ref 135–145)

## 2015-08-30 LAB — HEPATITIS C ANTIBODY (REFLEX): HCV Ab: 0.2 s/co ratio (ref 0.0–0.9)

## 2015-08-30 LAB — URINE CULTURE: Culture: >=100000

## 2015-08-30 LAB — CBC WITH DIFFERENTIAL/PLATELET
BASOS ABS: 0 10*3/uL (ref 0.0–0.1)
BASOS PCT: 0 %
EOS ABS: 0.1 10*3/uL (ref 0.0–0.7)
EOS PCT: 0 %
HEMATOCRIT: 40.5 % (ref 39.0–52.0)
HEMOGLOBIN: 12.8 g/dL — AB (ref 13.0–17.0)
LYMPHS ABS: 1.2 10*3/uL (ref 0.7–4.0)
Lymphocytes Relative: 7 %
MCH: 31.1 pg (ref 26.0–34.0)
MCHC: 31.6 g/dL (ref 30.0–36.0)
MCV: 98.3 fL (ref 78.0–100.0)
Monocytes Absolute: 1.9 10*3/uL — ABNORMAL HIGH (ref 0.1–1.0)
Monocytes Relative: 12 %
Neutro Abs: 13.1 10*3/uL — ABNORMAL HIGH (ref 1.7–7.7)
Neutrophils Relative %: 81 %
PLATELETS: 236 10*3/uL (ref 150–400)
RBC: 4.12 MIL/uL — AB (ref 4.22–5.81)
RDW: 14.6 % (ref 11.5–15.5)
WBC: 16.2 10*3/uL — AB (ref 4.0–10.5)

## 2015-08-30 LAB — PROTIME-INR
INR: 1.7 — AB (ref 0.00–1.49)
PROTHROMBIN TIME: 20 s — AB (ref 11.6–15.2)

## 2015-08-30 LAB — PROCALCITONIN: Procalcitonin: <0.1 ng/mL

## 2015-08-30 LAB — HCV COMMENT:

## 2015-08-30 LAB — VITAMIN D 25 HYDROXY (VIT D DEFICIENCY, FRACTURES): Vit D, 25-Hydroxy: 8 ng/mL — ABNORMAL LOW (ref 30.0–100.0)

## 2015-08-30 LAB — MAGNESIUM: MAGNESIUM: 2.1 mg/dL (ref 1.7–2.4)

## 2015-08-30 MED ORDER — WARFARIN 0.5 MG HALF TABLET
0.5000 mg | ORAL_TABLET | Freq: Once | ORAL | Status: AC
Start: 2015-08-30 — End: 2015-08-30
  Administered 2015-08-30: 0.5 mg via ORAL
  Filled 2015-08-30 (×2): qty 1

## 2015-08-30 MED ORDER — SODIUM CHLORIDE 0.9 % IV SOLN
1.5000 g | Freq: Three times a day (TID) | INTRAVENOUS | Status: DC
  Administered 2015-08-30 – 2015-08-31 (×2): 1.5 g via INTRAVENOUS
  Filled 2015-08-30 (×6): qty 1.5

## 2015-08-30 NOTE — Progress Notes (Signed)
Patient bed is broke at this time to gather morning weight. Will put patient on stepdown bed in AM in order to get weight of patient. Gildardo Cranker, RN

## 2015-08-30 NOTE — Progress Notes (Signed)
Dr Merrilyn Puma called [phone] and he and other md's have discussed need for short term snf. sw ref placed and alerted sw would be ready 3-16. Will cont to follow.

## 2015-08-30 NOTE — Progress Notes (Signed)
ANTICOAGULATION CONSULT NOTE - Follow Up Consult  Pharmacy Consult for coumadin Indication: atrial fibrillation  Allergies  Allergen Reactions  . Codeine Nausea And Vomiting    Patient Measurements: Height: 6\' 2"  (188 cm) Weight: 169 lb 15.6 oz (77.1 kg) IBW/kg (Calculated) : 82.2  Vital Signs: Temp: 98.7 F (37.1 C) (03/15 0855) Temp Source: Oral (03/15 0855) BP: 110/58 mmHg (03/15 0855) Pulse Rate: 74 (03/15 0855)  Labs:  Recent Labs  08/28/15 0558 08/28/15 1235 08/28/15 1828 08/29/15 0006 08/29/15 0502 08/30/15 0300  HGB 14.2  --   --  14.3 13.3 12.8*  HCT 43.7  --   --  43.3 41.5 40.5  PLT 287  --   --  265 227 236  LABPROT 18.1*  --   --   --  20.1* 20.0*  INR 1.49  --   --   --  1.71* 1.70*  CREATININE 1.14  --   --   --  1.28* 1.52*  TROPONINI  --  0.06* 0.05*  --   --   --     Estimated Creatinine Clearance: 39.5 mL/min (by C-G formula based on Cr of 1.52).   Medications:  Scheduled:  . ampicillin-sulbactam (UNASYN) IV  1.5 g Intravenous Q8H  . antiseptic oral rinse  7 mL Mouth Rinse q12n4p  . chlorhexidine  15 mL Mouth Rinse BID  . collagenase   Topical Daily  . feeding supplement (PRO-STAT SUGAR FREE 64)  30 mL Oral TID BM  . multivitamin with minerals  1 tablet Oral Daily  . simvastatin  20 mg Oral q1800  . Warfarin - Pharmacist Dosing Inpatient   Does not apply q1800    Assessment: 76 yom admitted with SOB. Afib, continuing on warfarin (pta) per pharmacy.  INR is 1.70 (likely in setting of HF). Coumadin was not given 3/13 due to NPO status - diet now ordered.  -Hematuria resolved this morning per nursing.  - Will give low dose of warfarin tonight   Home dose 1mg /day  Goal of Therapy:  INR 2-3 Monitor platelets by anticoagulation protocol: Yes   Plan:  -Warfarin 0.5mg  tonight -Daily PT/INR  Erin Hearing PharmD., BCPS Clinical Pharmacist Pager [phone] 08/30/2015 10:42 AM

## 2015-08-30 NOTE — Progress Notes (Signed)
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 He did not require BiPAP. His BPs are much better than yesterday. He feels well at this time and is awake, oriented x 2 and conversant.   Objective: Vital signs in last 24 hours: Filed Vitals:   08/30/15 0400 08/30/15 0416 08/30/15 0431 08/30/15 0500  BP: 104/58 104/58  108/58  Pulse: 70 72  76  Temp:  98.8 F (37.1 C)    TempSrc:  Oral    Resp: 14   25  Height:      Weight:   169 lb 15.6 oz (77.1 kg)   SpO2: 100% 100%  100%   Weight change: 0 lb (0 kg)  Intake/Output Summary (Last 24 hours) at 08/30/15 0831 Last data filed at 08/30/15 0500  Gross per 24 hour  Intake 1947.5 ml  Output    400 ml  Net 1547.5 ml     Gen: Chronically ill-appearing, alert and oriented to person, place, and time Neck: No JVD noted. CV: Normal rate, regular rhythm, no rubs, or gallops. There is a 2/6 systolic murmur heard best at the R 2nd ICS that radiates to the neck, as well as a 4/6 systolic murmur heard best at the apex that radiates to the axilla. Pulmonary: Normal effort, decreased breath sounds RLL with dullness to percussion, no crackles or wheezes. Abdominal: Soft, non-tender, non-distended, without rebound, guarding, or masses Extremities: Distal pulses 2+ in upper and lower extremities bilaterally, no tenderness, erythema or edema  Lab Results: Basic Metabolic Panel:  Recent Labs Lab 08/29/15 0502 08/30/15 0300  NA 140 139  K 3.9 4.4  CL 96* 96*  CO2 35* 36*  GLUCOSE 161* 119*  BUN 26* 30*  CREATININE 1.28* 1.52*  CALCIUM 9.1 8.9  MG 1.6* 2.1   CBC:  Recent Labs Lab 08/29/15 0502 08/30/15 0300  WBC 17.7* 16.2*  NEUTROABS 15.6* 13.1*  HGB 13.3 12.8*  HCT 41.5 40.5  MCV 97.6 98.3  PLT 227 236   Coagulation:  Recent Labs Lab 08/28/15 0558 08/29/15 0502 08/30/15 0300  LABPROT 18.1* 20.1* 20.0*  INR 1.49 1.71* 1.70*   Assessment/Plan: 1. Acute hypoxic/hypercapnic respiratory failure in setting of  severe MR, combined CHF, large R pleural effusion - he has been improving with intermittent BiPAP, with repeat ABG showing slight improvement in hypercapnea. Soft BPs likely 2/2 diuresis and continuing Entresto, but with effusion, rising leukocytosis and low-grade isolated fever, there is concern for underlying PNA, although this is less likely the cause for his symptoms as his worsening cardiac disease and associated effusion. TTE shows preserved EF with severe MR. -Hold Entresto and IV diuresis temporarily in the setting of hypotension; did respond well to small fluid boluses -Linezolid, pip/tazo x 1. Switch to Unasyn today for ?PNA +/- parapneumonic effusion but patient declines thoracentesis at this time; will c/w abx and transition to augmentin -Consulted Dr. Lamonte Sakai with PCCM for possible Rt thoracentesis; pt declined at this time -Cardiology on board; appreciate thoughtful care of this mutual patient -Trend BMPs -F/u BCx, UCx - NGTD, showing 50K yeast in UCx -Consider palliative care consult, follow PT/OT recs  2. Hematuria - does have h/o PrCA s/p cryoablation in 2011, with hematuria back in 02/2015 and rising PSA, so was started on Casodex by his urologist. Most recent PSA last month was normal (1.1), so I question whether this is recurrent prostate cancer vs urethral trauma in the setting of anticoagulation. H and H are stable. -Continue to monitor clinically -Trend  CBCs -Follow-up with his urologist outpatient   Dispo: Disposition is deferred at this time, awaiting improvement of current medical problems.   The patient does have a current PCP Cletis Athens, MD) and does need an [facility] hospital follow-up appointment after discharge.  The patient does have transportation limitations that hinder transportation to clinic appointments.   LOS: 2 days   [name], MD 08/30/2015, 8:31 AM

## 2015-08-30 NOTE — Progress Notes (Signed)
  Date: 08/30/2015  Patient name: Norman Lee  Medical record number: [id number]  Date of birth: [birth year]   This patient's plan of care was discussed with the house staff. Please see their note for complete details. I concur with their findings.  Patient very sleepy by time we made rounds but arousable and interactive. Continues to have diminished breath sounds at right base, loud murmur  #1 Pleural effusion: would seem more likely transudative based on chronicity. He is refusing thoracocentesis. We will get CT scan to better characterize it. i am skeptical that this is an empyema   Greatly appreciate CCM's help  --we will narrow to unasyn abx wise --should improve with diuresis but his BP was low yesterday  #2 Hypotension concern for SIRS/SEPSIS: I think it is more likely he was overly diuresed in setting of continuing home meds but as above will narrow to unasyn and followup CT scan  #3 Valvular heart disease and acute on chronic systolic, diastolic HF: greatly appreciate Cardiology's help with this patient  #4 Hypercapnia/hypoxia: has been better but worry about risk for this recurring esp with pleural effusion undrained  #5 Goals of care: would benefit from palliative care consult to elucidate pathways going forward.   -Truman Hayward, MD 08/30/2015, 1:18 PM

## 2015-08-30 NOTE — Evaluation (Signed)
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 Resp Failure with hypercapnea, pleural effusion (chronic?); BPs have been low   Clinical Impression   PT admitted with SOB with respiratory failure. Pt currently with functional limitiations due to the deficits listed below (see OT problem list). PTA living at home with wife. No family present to confirm PTA.  Pt will benefit from skilled OT to increase their independence and safety with adls and balance to allow discharge SNF. Pt with very forward neck positioning and decr ability to extend neck. Pt very deconditioned and will required 24/ 7 (A) and max care.      Follow Up Recommendations  SNF;Supervision/Assistance - 24 hour    Equipment Recommendations  3 in 1 bedside comode;Wheelchair (measurements OT);Wheelchair cushion (measurements OT);Hospital bed    Recommendations for Other Services Other (comment) (palliative services)     Precautions / Restrictions Precautions Precautions: Fall (extremely weak) Precaution Comments: Watch O2 sats      Mobility Bed Mobility Overal bed mobility: +2 for physical assistance;Needs Assistance Bed Mobility: Rolling;Sidelying to Sit Rolling: Max assist Sidelying to sit: +2 for physical assistance;Max assist       General bed mobility comments: Cues for technique; needing physical asist for all aspects of bed mobility; max assist to elevate trunk to sit  Transfers Overall transfer level: Needs assistance Equipment used: 2 person hand held assist Transfers: Sit to/from Stand;Stand Pivot Transfers Sit to Stand: +2 physical assistance;Max  assist;From elevated surface Stand pivot transfers: +2 physical assistance;Total assist;From elevated surface       General transfer comment: Pt unable to progress BIL LE to initiate transfer. pt states "i am falling" with decr (A) for upright posture. pt total +2 total (A)     Balance Overall balance assessment: Needs assistance Sitting-balance support: No upper extremity supported;Feet supported Sitting balance-Leahy Scale: Zero Sitting balance - Comments: strong R lean Postural control: Right lateral lean Standing balance support: No upper extremity supported;During functional activity Standing balance-Leahy Scale: Zero                              ADL Overall ADL's : Needs assistance/impaired Eating/Feeding: Set up;Bed level Eating/Feeding Details (indicate cue type and reason): pt with extended time and effort required for self feeding.  Grooming: Dance movement psychotherapist;Set up;Bed level       Lower Body Bathing: Total assistance           Toilet Transfer: +2 for physical assistance;Maximal assistance;Stand-pivot Toilet Transfer Details (indicate cue type and reason): Pt initiated upward position but unable to sustain and required incr (A) level.            General ADL Comments: Pt with significant limitations in basic transfer and attention to task. Pt needs (A) to transition to EOB.      Vision     Perception     Praxis      Pertinent Vitals/Pain Pain Assessment: Faces Faces Pain Scale: Hurts little more Pain Location: grimace Pain Descriptors / Indicators: Grimacing Pain Intervention(s): Limited activity within patient's tolerance;Monitored during session;Repositioned     Hand Dominance Right   Extremity/Trunk Assessment Upper  Extremity Assessment Upper Extremity Assessment: Generalized weakness   Lower Extremity Assessment Lower Extremity Assessment: Defer to PT evaluation   Cervical / Trunk Assessment Cervical / Trunk Assessment:  Kyphotic;Other exceptions Cervical / Trunk Exceptions: Significant head and shoulders forward posture with marked weaknees in scapular retraction, trunk extension and c-spine extension   Communication Communication Communication: HOH   Cognition Arousal/Alertness: Awake/alert Behavior During Therapy: WFL for tasks assessed/performed Overall Cognitive Status: Within Functional Limits for tasks assessed                     General Comments       Exercises       Shoulder Instructions      Home Living Family/patient expects to be discharged to:: Private residence Living Arrangements: Spouse/significant other Available Help at Discharge: Family;Available 24 hours/day Type of Home: House Home Access: Stairs to enter [employer] of Steps: 1   Home Layout: Able to live on main level with bedroom/bathroom     Bathroom Shower/Tub: Occupational psychologist: Standard (sink close by)     Home Equipment: Walker - 2 wheels          Prior Functioning/Environment             OT Diagnosis: Generalized weakness   OT Problem List: Decreased strength;Decreased activity tolerance;Impaired balance (sitting and/or standing);Decreased safety awareness;Decreased knowledge of use of DME or AE;Decreased knowledge of precautions;Cardiopulmonary status limiting activity;Pain   OT Treatment/Interventions: Self-care/ADL training;Therapeutic exercise;DME and/or AE instruction;Energy conservation;Therapeutic activities;Patient/family education;Balance training    OT Goals(Current goals can be found in the care plan section) Acute Rehab OT Goals Patient Stated Goal: none stated OT Goal Formulation: Patient unable to participate in goal setting Time For Goal Achievement: 09/13/15 Potential to Achieve Goals: Fair  OT Frequency: Min 2X/week   Barriers to D/C: Decreased caregiver support          Co-evaluation PT/OT/SLP Co-Evaluation/Treatment: Yes Reason for  Co-Treatment: Complexity of the patient's impairments (multi-system involvement);For patient/therapist safety PT goals addressed during session: Mobility/safety with mobility OT goals addressed during session: ADL's and self-care;Strengthening/ROM      End of Session Equipment Utilized During Treatment: Gait belt;Oxygen Nurse Communication: Mobility status;Precautions;Need for lift equipment  Activity Tolerance: Patient limited by fatigue Patient left: in chair;with call bell/phone within reach;with chair alarm set;with nursing/sitter in room   Time: [id number] OT Time Calculation (min): 21 min Charges:  OT General Charges $OT Visit: 1 Procedure OT Evaluation $OT Eval High Complexity: 1 Procedure G-Codes:    Parke Poisson B September 13, 2015, 11:33 AM  Jeri Modena   OTR/L [phone] Office: [phone] .

## 2015-08-30 NOTE — Progress Notes (Signed)
Patient Name: Norman Lee Date of Encounter: 08/30/2015  Principal Problem:   Acute respiratory failure with hypoxia and hypercapnia ([place]) Active Problems:   Pacemaker - Medtronic Adapta 2014, leads 2008   CAD (coronary artery disease)   Mitral regurgitation, Severe   Prostate cancer ([place])   HLD (hyperlipidemia)   HTN (hypertension)   A-fib ([facility])   Anticoagulated on warfarin   Combined systolic and diastolic heart failure ([facility])   Vitamin D deficiency   Acute pulmonary edema ([facility])   Pleural effusion   Goals of care, counseling/discussion   Pressure ulcer   Community acquired pneumonia   Hypoxia   Sepsis ([place])   Aortic stenosis   Pleural effusion, right   Acute respiratory failure with hypercapnia ([place])   Length of Stay: 2  SUBJECTIVE  Substantially more alert, oriented, but easily falls asleep when not stimulated. Complains of stinging and tenderness at area of right pretibial ulceration, surrounding cellulitis. Denies dyspnea. Extremely weak. Required sky-lift to move out of bed  CURRENT MEDS . ampicillin-sulbactam (UNASYN) IV  1.5 g Intravenous Q8H  . antiseptic oral rinse  7 mL Mouth Rinse q12n4p  . chlorhexidine  15 mL Mouth Rinse BID  . collagenase   Topical Daily  . feeding supplement (PRO-STAT SUGAR FREE 64)  30 mL Oral TID BM  . multivitamin with minerals  1 tablet Oral Daily  . simvastatin  20 mg Oral q1800  . Warfarin - Pharmacist Dosing Inpatient   Does not apply q1800    OBJECTIVE   Intake/Output Summary (Last 24 hours) at 08/30/15 1036 Last data filed at 08/30/15 0500  Gross per 24 hour  Intake 1502.5 ml  Output    400 ml  Net 1102.5 ml   Filed Weights   08/28/15 0544 08/29/15 0500 08/30/15 0431  Weight: 77.111 kg (170 lb) 77.1 kg (169 lb 15.6 oz) 77.1 kg (169 lb 15.6 oz)    PHYSICAL EXAM Filed Vitals:   08/30/15 0416 08/30/15 0431 08/30/15 0500 08/30/15 0855  BP: 104/58  108/58 110/58  Pulse: 72  76 74  Temp: 98.8 F (37.1 C)    98.7 F (37.1 C)  TempSrc: Oral   Oral  Resp:   25 32  Height:      Weight:  77.1 kg (169 lb 15.6 oz)    SpO2: 100%  100% 99%   General: Drowsy, oriented x3, no distress Head: no evidence of trauma, PERRL, EOMI, no exophtalmos or lid lag, no myxedema, no xanthelasma; normal ears, nose and oropharynx Neck: normal jugular venous pulsations and no hepatojugular reflux; brisk carotid pulses without delay and no carotid bruits Chest: diminished brath sounds and dullness to percussion right base, no rales Cardiovascular: normal position and quality of the apical impulse, regular rhythm, normal first and paradoxically splitsecond heart sounds, no rubs or gallops, 3/6 holosystolic murmur Abdomen: no tenderness or distention, no masses by palpation, no abnormal pulsatility or arterial bruits, normal bowel sounds, no hepatosplenomegaly Extremities: no clubbing, cyanosis or edema; 2+ radial, ulnar and brachial pulses bilaterally; 2+ right femoral, posterior tibial and dorsalis pedis pulses; 2+ left femoral, posterior tibial and dorsalis pedis pulses; no subclavian or femoral bruits Neurological: grossly nonfocal  LABS  CBC  Recent Labs  08/29/15 0502 08/30/15 0300  WBC 17.7* 16.2*  NEUTROABS 15.6* 13.1*  HGB 13.3 12.8*  HCT 41.5 40.5  MCV 97.6 98.3  PLT 227 [id number]   Basic Metabolic Panel  Recent Labs  08/29/15 0502 08/30/15 0300  NA 140 139  K 3.9 4.4  CL 96* 96*  CO2 35* 36*  GLUCOSE 161* 119*  BUN 26* 30*  CREATININE 1.28* 1.52*  CALCIUM 9.1 8.9  MG 1.6* 2.1   Liver Function Tests  Recent Labs  08/28/15 0558  AST 23  ALT 12*  ALKPHOS 97  BILITOT 1.6*  PROT 7.6  ALBUMIN 3.8   No results for input(s): LIPASE, AMYLASE in the last 72 hours. Cardiac Enzymes  Recent Labs  08/28/15 1235 08/28/15 1828  TROPONINI 0.06* 0.05*    Radiology Studies Imaging results have been reviewed and Dg Chest Port 1 View  08/29/2015  CLINICAL DATA:  82 year old male with shortness  of breath. Right pleural effusion. Initial encounter. EXAM: PORTABLE CHEST 1 VIEW COMPARISON:  08/28/2015 and earlier. FINDINGS: Portable AP upright view at 0642 hours. Small to moderate dependent right pleural effusion appears stable. Underlying pulmonary vascularity has regressed. Stable cardiomegaly and mediastinal contours. Stable left chest cardiac pacemaker. No pneumothorax. No areas of worsening ventilation. IMPRESSION: 1. Stable small to moderate right pleural effusion with associated right lung base collapse or consolidation. 2. Interval regressed pulmonary vascular congestion. 3. No new cardiopulmonary abnormality. Electronically Signed   By: Genevie Ann M.D.   On: 08/29/2015 08:29    TELE V paced  ASSESSMENT AND PLAN  1. Acute on chronic combined systolic and diastolic heart failure due to combination of valvular heart disease and ischemic cardiomyopathy. EF 60% by current echo. He seems to now be euvolemic, continue Entresto as long as stable renal function and BP allow. 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 No clear reason to suspect a new acute coronary event, but ECG not useful for diagnosis. 4.Aortic stenosis, moderate by echo yesterday 5. Permanent atrial fibrillation with slow ventricular response on warfarin anticoagulation. Warfarin on hold due to hematuria. INR subtherapeutic. Avoid any negative inotropes to reduce need for pacing 6. Dual-chamber permanent pacemaker, programmed as single chamber device 7. Asymptomatic nonsustained ventricular tachycardia, brief and infrequent, previously recorded on pacemaker 8. Large right pleural effusion - chronic,  but larger than before. Also has high WBC and could be more than just CHF. Consider empyema/parapneumnic effusion ; he declined thoracentesis. 9. Altered mental status - mostly due to hypercapnia/hypoxia; improving  Extremely frail - will need SNF, maybe even permanent NH. Prognosis poor.  Sanda Klein, MD, [facility] [phone] office [phone] pager 08/30/2015 10:36 AM

## 2015-08-30 NOTE — Evaluation (Signed)
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 Resp Failure with hypercapnea, pleural effusion (chronic?); BPs have been low  Clinical Impression   Pt admitted with above diagnosis. Pt currently with functional limitations due to the deficits listed below (see PT Problem List).   Pt's current functional level is congruent with need for 24 hour skilled nursing care; Will recommend SNF for post-acute rehab -- his goal is to get home, and rehab will help to maximize independence and safety with mobility and ADLs prior to dc home; Will follow and update recommendations as needed, taking into account possible Palliative Care Team involvement;   Pt will benefit from skilled PT to increase their independence and safety with mobility to allow discharge to the venue listed below.       Follow Up Recommendations SNF    Equipment Recommendations  Other (comment) (to be determined)    Recommendations for Other Services       Precautions / Restrictions Precautions Precautions: Fall (extremely weak) Precaution Comments: Watch O2 sats      Mobility  Bed Mobility Overal bed mobility: +2 for physical assistance;Needs Assistance Bed Mobility: Rolling;Sidelying to Sit Rolling: Max assist Sidelying to sit: +2 for physical assistance;Max assist       General bed mobility comments: Cues for technique; needing physical asist for all aspects of bed mobility; max assist to elevate trunk to sit  Transfers Overall transfer level: Needs assistance Equipment used: 2 person hand held assist (support at bil shoulder girdles and gait belt) Transfers: Sit  to/from Stand;Stand Pivot Transfers Sit to Stand: +2 physical assistance;Max assist Stand pivot transfers: +2 physical assistance;Total assist       General transfer comment: Able to recruit and activate major LE muscle groups for straight sit to stand and upright stading, but unable to maintain stability for greater than 3 seconds; +2 Total assist with knees blocked to pivot to chair  Ambulation/Gait                Stairs            Wheelchair Mobility    Modified Rankin (Stroke Patients Only)       Balance Overall balance assessment: Needs assistance Sitting-balance support: No upper extremity supported;Feet supported Sitting balance-Leahy Scale: Zero Sitting balance - Comments: strong R lean Postural control: Right lateral lean Standing balance support: No upper extremity supported;During functional activity Standing balance-Leahy Scale: Zero                               Pertinent Vitals/Pain Pain Assessment: Faces Faces Pain Scale: Hurts little more Pain Location: grimace Pain Descriptors / Indicators: Grimacing Pain Intervention(s): Limited activity within patient's tolerance    Home Living Family/patient expects to be discharged to:: Private residence Living Arrangements: Spouse/significant other Available Help at Discharge: Family;Available 24 hours/day Type of Home: House Home Access: Stairs to enter   [employer] of Steps: 1 Home Layout: Able to live on main level with bedroom/bathroom Home Equipment: Walker - 2 wheels      Prior Function Level of Independence:  (To be determined; as of Sept of last year,  was walking with RW)               Hand Dominance   Dominant Hand: Right    Extremity/Trunk Assessment   Upper Extremity Assessment: Defer to OT evaluation           Lower Extremity Assessment: Generalized weakness      Cervical / Trunk Assessment: Kyphotic;Other exceptions  Communication    Communication: HOH  Cognition Arousal/Alertness: Awake/alert Behavior During Therapy: WFL for tasks assessed/performed Overall Cognitive Status: Within Functional Limits for tasks assessed                      General Comments General comments (skin integrity, edema, etc.): high risk for skin break down. Rn educated on don of lift pad and need to have change of position after 1 hour to prevent skin break down    Exercises        Assessment/Plan    PT Assessment Patient needs continued PT services  PT Diagnosis Generalized weakness;Difficulty walking (Decr functional capacity)   PT Problem List Decreased strength;Decreased range of motion;Decreased activity tolerance;Decreased balance;Decreased mobility;Decreased coordination;Decreased knowledge of use of DME;Decreased knowledge of precautions;Pain;Cardiopulmonary status limiting activity  PT Treatment Interventions DME instruction;Gait training;Functional mobility training;Therapeutic activities;Therapeutic exercise;Balance training;Patient/family education   PT Goals (Current goals can be found in the Care Plan section) Acute Rehab PT Goals Patient Stated Goal: none stated PT Goal Formulation: With patient Time For Goal Achievement: 09/13/15 Potential to Achieve Goals: Good    Frequency Min 3X/week   Barriers to discharge        Co-evaluation PT/OT/SLP Co-Evaluation/Treatment: Yes Reason for Co-Treatment: Complexity of the patient's impairments (multi-system involvement);For patient/therapist safety PT goals addressed during session: Mobility/safety with mobility OT goals addressed during session: ADL's and self-care;Strengthening/ROM       End of Session Equipment Utilized During Treatment: Gait belt;Oxygen Activity Tolerance: Patient limited by fatigue Patient left: in chair;with call bell/phone within reach;with chair alarm set Nurse Communication: Mobility status;Need for lift equipment         Time:  0934-1000 PT Time Calculation (min) (ACUTE ONLY): 26 min   Charges:   PT Evaluation $PT Eval Moderate Complexity: 1 Procedure     PT G CodesRoney Marion Hamff 08/30/2015, 2:03 PM  Roney Marion, [place] Pager [phone] Office [phone]

## 2015-08-30 NOTE — Clinical Social Work Note (Signed)
CSW received consult for patient needing SNF, CSW to meet with patient and discuss SNF search process.  Formal assessment to follow.  Jones Broom. [place], MSW, [place] 08/30/2015 6:16 PM

## 2015-08-30 NOTE — Progress Notes (Signed)
Pharmacy Antibiotic Note  Norman Lee is a 82 y.o. male admitted on 08/28/2015 with acute respiratory failure.  Pharmacy has been consulted to narrow antibiotics to Unasyn for possible pneumonia coverage.  Plan: - Stop Zosyn and Linezolid - Start Unasyn 1.5 gm IV q8h - Likely switch to PO Augmentin tomorrow - F/u cultures, CT results, CBC, LOT, clinical course  Height: 6\' 2"  (188 cm) Weight: 169 lb 15.6 oz (77.1 kg) IBW/kg (Calculated) : 82.2  Temp (24hrs), Avg:98.1 F (36.7 C), Min:97.1 F (36.2 C), Max:98.8 F (37.1 C)   Recent Labs Lab 08/28/15 0558 08/28/15 0607 08/29/15 0006 08/29/15 0502 08/30/15 0300  WBC 14.0*  --  20.0* 17.7* 16.2*  CREATININE 1.14  --   --  1.28* 1.52*  LATICACIDVEN  --  2.14*  --   --   --     Estimated Creatinine Clearance: 39.5 mL/min (by C-G formula based on Cr of 1.52).    Allergies  Allergen Reactions  . Codeine Nausea And Vomiting    Antimicrobials this admission: Zosyn 3/14 >> 3/15 Linezolid per ID 3/14 >> 3/15 Unasyn 3/15 >>  Dose adjustments this admission: None  Microbiology results: 3/13 BCx: ngtd 3/14 UCx: yeast 3/13 MRSA PCR: negative  Thank you for allowing pharmacy to be a part of this patient's care.  Leathia Farnell L. Nicole Kindred, PharmD PGY2 Infectious Diseases Pharmacy Resident Pager: [phone] 08/30/2015 9:38 AM

## 2015-08-31 DIAGNOSIS — Z9981 Dependence on supplemental oxygen: Secondary | ICD-10-CM

## 2015-08-31 DIAGNOSIS — I5042 Chronic combined systolic (congestive) and diastolic (congestive) heart failure: Secondary | ICD-10-CM

## 2015-08-31 LAB — MAGNESIUM: MAGNESIUM: 2.1 mg/dL (ref 1.7–2.4)

## 2015-08-31 LAB — BASIC METABOLIC PANEL
Anion gap: 8 (ref 5–15)
BUN: 30 mg/dL — AB (ref 6–20)
CO2: 34 mmol/L — ABNORMAL HIGH (ref 22–32)
CREATININE: 1.34 mg/dL — AB (ref 0.61–1.24)
Calcium: 9.4 mg/dL (ref 8.9–10.3)
Chloride: 97 mmol/L — ABNORMAL LOW (ref 101–111)
GFR, EST AFRICAN AMERICAN: 54 mL/min — AB (ref >60)
GFR, EST NON AFRICAN AMERICAN: 47 mL/min — AB (ref >60)
Glucose, Bld: 124 mg/dL — ABNORMAL HIGH (ref 65–99)
POTASSIUM: 4.7 mmol/L (ref 3.5–5.1)
SODIUM: 139 mmol/L (ref 135–145)

## 2015-08-31 LAB — PROTIME-INR
INR: 1.48 (ref 0.00–1.49)
PROTHROMBIN TIME: 18 s — AB (ref 11.6–15.2)

## 2015-08-31 MED ORDER — LORAZEPAM 2 MG/ML IJ SOLN: 0.5000 mg | INTRAMUSCULAR | Status: DC | PRN

## 2015-08-31 MED ORDER — HYDROMORPHONE HCL 1 MG/ML IJ SOLN
0.5000 mg | INTRAMUSCULAR | Status: DC | PRN
  Administered 2015-08-31 – 2015-09-01 (×3): 0.5 mg via INTRAVENOUS
  Filled 2015-08-31 (×3): qty 1

## 2015-08-31 MED ORDER — WARFARIN SODIUM 2 MG PO TABS: 1.0000 mg | ORAL_TABLET | Freq: Once | ORAL | Status: DC

## 2015-08-31 MED ORDER — CETYLPYRIDINIUM CHLORIDE 0.05 % MT LIQD: 7.0000 mL | Freq: Two times a day (BID) | OROMUCOSAL | Status: DC

## 2015-08-31 NOTE — Progress Notes (Signed)
Patient Name: Norman Lee Date of Encounter: 08/31/2015  Principal Problem:   Acute respiratory failure with hypoxia and hypercapnia ([place]) Active Problems:   Pacemaker - Medtronic Adapta 2014, leads 2008   CAD (coronary artery disease)   Mitral regurgitation, Severe   Prostate cancer ([place])   HLD (hyperlipidemia)   HTN (hypertension)   A-fib (HCC)   Anticoagulated on warfarin   Combined systolic and diastolic heart failure (HCC)   Vitamin D deficiency   Acute pulmonary edema (HCC)   Pleural effusion   Goals of care, counseling/discussion   Pressure ulcer   Community acquired pneumonia   Hypoxia   Sepsis ([place])   Aortic stenosis   Pleural effusion, right   Acute respiratory failure with hypercapnia ([place])   Length of Stay: 3  SUBJECTIVE  Much harder to arouse, speaking of his childhood and youth. Identifying those in the room with old family and friends.  CURRENT MEDS . ampicillin-sulbactam (UNASYN) IV  1.5 g Intravenous Q8H  . antiseptic oral rinse  7 mL Mouth Rinse q12n4p  . chlorhexidine  15 mL Mouth Rinse BID  . collagenase   Topical Daily  . feeding supplement (PRO-STAT SUGAR FREE 64)  30 mL Oral TID BM  . multivitamin with minerals  1 tablet Oral Daily  . simvastatin  20 mg Oral q1800  . warfarin  1 mg Oral ONCE-1800  . Warfarin - Pharmacist Dosing Inpatient   Does not apply q1800    OBJECTIVE   Intake/Output Summary (Last 24 hours) at 08/31/15 1035 Last data filed at 08/31/15 1000  Gross per 24 hour  Intake    100 ml  Output    850 ml  Net   -750 ml   Filed Weights   08/29/15 0500 08/30/15 0431 08/31/15 0427  Weight: 77.1 kg (169 lb 15.6 oz) 77.1 kg (169 lb 15.6 oz) 77.1 kg (169 lb 15.6 oz)    PHYSICAL EXAM Filed Vitals:   08/31/15 0731 08/31/15 0800 08/31/15 0900 08/31/15 1000  BP: 140/72     Pulse: 73 70 70 70  Temp: 98.5 F (36.9 C)     TempSrc: Oral     Resp: 38 36 22 32  Height:      Weight:      SpO2: 100% 100% 89% 84%    General: Drowsy, disoriented, no distress Head: no evidence of trauma, PERRL, EOMI, no exophtalmos or lid lag, no myxedema, no xanthelasma; normal ears, nose and oropharynx Neck: normal jugular venous pulsations and no hepatojugular reflux; brisk carotid pulses without delay and no carotid bruits Chest: diminished brath sounds and dullness to percussion right base, no rales Cardiovascular: normal position and quality of the apical impulse, regular rhythm, normal first and paradoxically splitsecond heart sounds, no rubs or gallops, 3/6 holosystolic murmur Abdomen: no tenderness or distention, no masses by palpation, no abnormal pulsatility or arterial bruits, normal bowel sounds, no hepatosplenomegaly Extremities: no clubbing, cyanosis or edema; 2+ radial, ulnar and brachial pulses bilaterally; 2+ right femoral, posterior tibial and dorsalis pedis pulses; 2+ left femoral, posterior tibial and dorsalis pedis pulses; no subclavian or femoral bruits Neurological: exam limited by mental status  LABS  CBC  Recent Labs  08/29/15 0502 08/30/15 0300  WBC 17.7* 16.2*  NEUTROABS 15.6* 13.1*  HGB 13.3 12.8*  HCT 41.5 40.5  MCV 97.6 98.3  PLT 227 [id number]   Basic Metabolic Panel  Recent Labs  08/30/15 0300 08/31/15 0300  NA 139 139  K 4.4 4.7  CL  96* 97*  CO2 36* 34*  GLUCOSE 119* 124*  BUN 30* 30*  CREATININE 1.52* 1.34*  CALCIUM 8.9 9.4  MG 2.1 2.1   Liver Function Tests No results for input(s): AST, ALT, ALKPHOS, BILITOT, PROT, ALBUMIN in the last 72 hours. No results for input(s): LIPASE, AMYLASE in the last 72 hours. Cardiac Enzymes  Recent Labs  08/28/15 1235 08/28/15 1828  TROPONINI 0.06* 0.05*   Radiology Studies Imaging results have been reviewed and Ct Chest Wo Contrast  08/30/2015  CLINICAL DATA:  Pleural effusion EXAM: CT CHEST WITHOUT CONTRAST TECHNIQUE: Multidetector CT imaging of the chest was performed following the standard protocol without IV contrast.  COMPARISON:  CT abdomen 03/07/2015 FINDINGS: 1.5 cm precarinal lymph node. Small pericardial effusion surrounding the lower great vessels. Smaller but mildly prominent AP window nodes are noted. Three vessel coronary artery calcification. Pacemaker device. Dense mitral annular calcification. Small left pleural effusion is larger. Moderate right pleural effusion is larger. Pleural fluid extends anteriorly suggesting loculation in complication. No pneumothorax There is extensive confluent consolidation in the dependent right lower lobe as well as the posterior right upper lobe. Underlying malignancy within this area of consolidation cannot be excluded. Density of the consolidation is somewhat heterogeneous with areas of soft tissue density and areas of lower density. There is no obvious endobronchial lesion No destructive bone lesion. No definite acute fracture. Degenerative changes in the lower cervical spine are noted. IMPRESSION: Mild mediastinal adenopathy. Complex right pleural effusion. Small left pleural effusion. Both pleural effusions are larger. Extensive consolidation in the right lung with heterogeneity. Underlying malignancy within the area of consolidation cannot be excluded. Bronchoscopy or PET-CT may be helpful. Electronically Signed   By: Marybelle Killings M.D.   On: 08/30/2015 12:16    TELE V paced   ASSESSMENT AND PLAN  1. Acute on chronic combined systolic and diastolic heart failure due to combination of valvular heart disease and ischemic cardiomyopathy. EF 60% by current echo. He seems to now be euvolemic. 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 No clear reason  to suspect a new acute coronary event, but ECG not useful for diagnosis. 4.Aortic stenosis, moderate by echo yesterday 5. Permanent atrial fibrillation with slow ventricular response on warfarin anticoagulation. Warfarin on hold due to hematuria. INR subtherapeutic. Avoid any negative inotropes to reduce need for pacing 6. Dual-chamber permanent pacemaker, programmed as single chamber device 7. Asymptomatic nonsustained ventricular tachycardia, brief and infrequent, previously recorded on pacemaker 8. Large right pleural effusion - chronic, but larger than before. Complex on CT. Also has high WBC and likely more than just CHF. Consider empyema/parapneumnic effusion ; he declined thoracentesis. 9. Altered mental status - mostly due to hypercapnia/hypoxia; waxing and waning.  He seems to be best suited for palliative care and there is current evaluation for inpatient hospice.   Sanda Klein, MD, [facility] [phone] office [phone] pager 08/31/2015 10:35 AM

## 2015-08-31 NOTE — Progress Notes (Signed)
Nutrition Brief Note  Chart reviewed. Pt now transitioning to comfort care.  No further nutrition interventions warranted at this time.  Please re-consult as needed.   Erika Slaby A. Wilferd Ritson, RD, LDN, CDE Pager: [phone] After hours Pager: [phone]  

## 2015-08-31 NOTE — Progress Notes (Addendum)
   Subjective: Paged at [place] about patient having worsening mental status over the past several hours. Patient reported stating he believes he is having a stroke. Wife at bedside reports she returned to see patient 2 hours ago and his mental status has declined since she saw him earlier in the day. States he had a stroke last year with dysarthria at that time but has since resolved. Patient is less alert than last night. He is able to tell me his name, year, president but is not able to tell me where he is or his wife's name. Easily distractible.    Objective: GENERAL- alert and oriented to person and year, easily distractible, cooperative with exam CARDIAC- RRR, systolic murmur present RESP- Moving equal volumes of air, decreased breath sounds on right ABDOMEN- Soft, nontender, bowel sounds present NEURO- No obvious Cr N abnormality. Equal strength and sensation bilaterally. No focal deficits.   Assessment/Plan: Patient with no focal neurologic deficits concerning for acute CVA. Patient has been off BiPAP for >36 hours. Likely retaining CO2 again causing decline in mental status. Attempted to put patient back on BiPAP and get a repeat ABG. Patient initially agreed but after leaving the room he declined BiPAP (wfie agrees).  RT attempted ABG x1 but was unsuccessful. Opted to not try for repeat ABG after first unsuccessful ABG attempt now that patient declining BiPAP. Discussed with the wife further goals of care as patient is declining intervention. She did note that he would be more willing to allow procedures such as the thoracentesis if he was show the report/images. However, now that he is declining BiPAP it is unlikely that his mental status will improve enough to consent to any procedure. Likely heading toward comfort care. Will have day team continue to address in the morning.    LOS: 3 days   Maryellen Pile, MD IMTS PGY-1 [phone] 08/31/2015, 3:20 AM

## 2015-08-31 NOTE — Progress Notes (Signed)
Attempted to call report to 6N x 1  

## 2015-08-31 NOTE — Progress Notes (Signed)
ANTICOAGULATION CONSULT NOTE - Follow Up Consult  Pharmacy Consult for coumadin Indication: atrial fibrillation  Allergies  Allergen Reactions  . Codeine Nausea And Vomiting    Patient Measurements: Height: 6\' 2"  (188 cm) Weight: 169 lb 15.6 oz (77.1 kg) (Bed scale is broken at this time) IBW/kg (Calculated) : 82.2  Vital Signs: Temp: 98.5 F (36.9 C) (03/16 0731) Temp Source: Oral (03/16 0731) BP: 140/72 mmHg (03/16 0731) Pulse Rate: 73 (03/16 0731)  Labs:  Recent Labs  08/28/15 1235 08/28/15 1828  08/29/15 0006 08/29/15 0502 08/30/15 0300 08/31/15 0300  HGB  --   --   < > 14.3 13.3 12.8*  --   HCT  --   --   --  43.3 41.5 40.5  --   PLT  --   --   --  265 227 236  --   LABPROT  --   --   --   --  20.1* 20.0* 18.0*  INR  --   --   --   --  1.71* 1.70* 1.48  CREATININE  --   --   --   --  1.28* 1.52* 1.34*  TROPONINI 0.06* 0.05*  --   --   --   --   --   < > = values in this interval not displayed.  Estimated Creatinine Clearance: 44.8 mL/min (by C-G formula based on Cr of 1.34).   Medications:  Scheduled:  . ampicillin-sulbactam (UNASYN) IV  1.5 g Intravenous Q8H  . antiseptic oral rinse  7 mL Mouth Rinse q12n4p  . chlorhexidine  15 mL Mouth Rinse BID  . collagenase   Topical Daily  . feeding supplement (PRO-STAT SUGAR FREE 64)  30 mL Oral TID BM  . multivitamin with minerals  1 tablet Oral Daily  . simvastatin  20 mg Oral q1800  . Warfarin - Pharmacist Dosing Inpatient   Does not apply q1800    Assessment: 42 yom admitted with SOB. Afib, continuing on warfarin (pta) per pharmacy.  INR is 1.48. Coumadin was not given 3/13 due to NPO status - diet now ordered.  -Hematuria resolved this morning per nursing.  - AMS still morning after refusing BiPAP   Home dose 1mg /day  Goal of Therapy:  INR 2-3 Monitor platelets by anticoagulation protocol: Yes   Plan:  -Warfarin 1mg  tonight if to continue medical management -Daily PT/INR  Erin Hearing PharmD.,  BCPS Clinical Pharmacist Pager [phone] 08/31/2015 7:58 AM

## 2015-08-31 NOTE — Progress Notes (Signed)
Patient now refusing to wear any sort of oxygen therapy, including nasal canula. Will continue to monitor patient and order a social work consult for AM. Gildardo Cranker, RN

## 2015-08-31 NOTE — Discharge Summary (Signed)
Name: Norman Lee MRN: [id number] DOB: 1929-04-01 82 y.o. PCP: Cletis Athens, MD  Date of Admission: 08/28/2015  5:36 AM Date of Discharge: 08/31/2015 Attending Physician: Axel Filler, MD  Discharge Diagnosis: 1. Acute hypercapnic respiratory failure due to severe mitral regurgitation Principal Problem:   Acute respiratory failure with hypoxia and hypercapnia (HCC) Active Problems:   Pacemaker - Medtronic Adapta 2014, leads 2008   CAD (coronary artery disease)   Mitral regurgitation, Severe   Prostate cancer (HCC)   HLD (hyperlipidemia)   HTN (hypertension)   A-fib (HCC)   Anticoagulated on warfarin   Combined systolic and diastolic heart failure (HCC)   Vitamin D deficiency   Acute pulmonary edema (HCC)   Pleural effusion   Goals of care, counseling/discussion   Pressure ulcer   Community acquired pneumonia   Hypoxia   Sepsis ([place])   Aortic stenosis   Pleural effusion, right   Acute respiratory failure with hypercapnia ([place])  Discharge Medications:   Medication List    ASK your doctor about these medications        acetaminophen 500 MG tablet  Commonly known as:  TYLENOL  Take 500-1,000 mg by mouth every 6 (six) hours as needed for moderate pain.     aspirin 81 MG tablet  Take 81 mg by mouth daily.     bicalutamide 50 MG tablet  Commonly known as:  CASODEX  Take 1 tablet (50 mg total) by mouth daily.     feeding supplement (ENSURE ENLIVE) Liqd  Take 237 mLs by mouth 2 (two) times daily between meals.     furosemide 40 MG tablet  Commonly known as:  LASIX  Take 40 mg by mouth daily.     hydrocortisone cream 1 %  Apply 1 application topically daily as needed for itching (psoriasis).     ramipril 10 MG capsule  Commonly known as:  ALTACE  Take 10 mg by mouth daily.     sacubitril-valsartan 24-26 MG  Commonly known as:  ENTRESTO  Take 1 tablet by mouth 2 (two) times daily.     simvastatin 20 MG tablet  Commonly known as:  ZOCOR    TAKE 1 TABLET (20 MG TOTAL) BY MOUTH AT BEDTIME.     warfarin 1 MG tablet  Commonly known as:  COUMADIN  Take 1-2 tablets by mouth daily as directed        Disposition and follow-up:   Norman Lee was discharged from [facility] in serious condition.  At the hospital follow up visit please address:  1.  Respiratory symptoms, mentation  2.  Labs / imaging needed at time of follow-up: None  3.  Pending labs/ test needing follow-up: None  Follow-up Appointments: None   Discharge Instructions: Comfort care measures   Consultations: Treatment Team:  Rounding Lbcardiology, MD  Procedures Performed:  Ct Chest Wo Contrast  08/30/2015  CLINICAL DATA:  Pleural effusion EXAM: CT CHEST WITHOUT CONTRAST TECHNIQUE: Multidetector CT imaging of the chest was performed following the standard protocol without IV contrast. COMPARISON:  CT abdomen 03/07/2015 FINDINGS: 1.5 cm precarinal lymph node. Small pericardial effusion surrounding the lower great vessels. Smaller but mildly prominent AP window nodes are noted. Three vessel coronary artery calcification. Pacemaker device. Dense mitral annular calcification. Small left pleural effusion is larger. Moderate right pleural effusion is larger. Pleural fluid extends anteriorly suggesting loculation in complication. No pneumothorax There is extensive confluent consolidation in the dependent right lower lobe as well as  the posterior right upper lobe. Underlying malignancy within this area of consolidation cannot be excluded. Density of the consolidation is somewhat heterogeneous with areas of soft tissue density and areas of lower density. There is no obvious endobronchial lesion No destructive bone lesion. No definite acute fracture. Degenerative changes in the lower cervical spine are noted. IMPRESSION: Mild mediastinal adenopathy. Complex right pleural effusion. Small left pleural effusion. Both pleural effusions are larger.  Extensive consolidation in the right lung with heterogeneity. Underlying malignancy within the area of consolidation cannot be excluded. Bronchoscopy or PET-CT may be helpful. Electronically Signed   By: Marybelle Killings M.D.   On: 08/30/2015 12:16   Dg Chest Port 1 View  08/29/2015  CLINICAL DATA:  82 year old male with shortness of breath. Right pleural effusion. Initial encounter. EXAM: PORTABLE CHEST 1 VIEW COMPARISON:  08/28/2015 and earlier. FINDINGS: Portable AP upright view at 0642 hours. Small to moderate dependent right pleural effusion appears stable. Underlying pulmonary vascularity has regressed. Stable cardiomegaly and mediastinal contours. Stable left chest cardiac pacemaker. No pneumothorax. No areas of worsening ventilation. IMPRESSION: 1. Stable small to moderate right pleural effusion with associated right lung base collapse or consolidation. 2. Interval regressed pulmonary vascular congestion. 3. No new cardiopulmonary abnormality. Electronically Signed   By: Genevie Ann M.D.   On: 08/29/2015 08:29   Dg Chest Portable 1 View  08/28/2015  CLINICAL DATA:  Increasing shortness of breath with exertion over the past several days. Oxygen desaturation. Initial encounter. EXAM: PORTABLE CHEST 1 VIEW COMPARISON:  PA and lateral chest 03/07/2015 and 10/20/2014. FINDINGS: The patient has a moderate to moderately large right pleural effusion with hazy opacity throughout the right chest. The left lung is clear. There is cardiomegaly. Pacing device is in place. IMPRESSION: Moderate to moderately large right pleural effusion with associated airspace disease which could be due to atelectasis or pneumonia. Electronically Signed   By: Inge Rise M.D.   On: 08/28/2015 07:09   2D Echo: Study Conclusions  - Left ventricle: The cavity size was normal. Systolic function was  normal. The estimated ejection fraction was in the range of 60%  to 65%. Wall motion was normal; there were no regional wall   motion abnormalities. Doppler parameters are consistent with a  reversible restrictive pattern, indicative of decreased left  ventricular diastolic compliance and/or increased left atrial  pressure (grade 3 diastolic dysfunction). - Ventricular septum: The contour showed diastolic flattening. - Aortic valve: Trileaflet; moderately thickened, severely  calcified leaflets, fixed right coronary cusp. Valve mobility was  restricted. There was moderate stenosis. Peak velocity (S): 338  cm/s. Mean gradient (S): 26 mm Hg. - Mitral valve: Moderately to severely calcified annulus. Mildly  thickened, likely partial flail leaflet, moderately calcified  leaflets . There was severe regurgitation directed anteriorly. - Left atrium: The atrium was severely dilated. - Right ventricle: The cavity size was moderately dilated. Wall  thickness was normal. Pacer wire or catheter noted in right  ventricle. - Right atrium: The atrium was severely dilated. - Tricuspid valve: There was moderate regurgitation. - Pulmonary arteries: Systolic pressure was mildly increased. PA  peak pressure: 45 mm Hg (S).  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   History  limited by pt's somnolence.   He reports having progressive dyspnea for the past year that worsened two days ago with associated orthopnea and DOE with stable chronic LE edema. He reports taking lasix 40 mg BID and denies dietary indiscretion or weight gain. He denies fever, chills, chest pain, palpitations, syncope, lightheadedness, cough, wheezing, or URI symptoms. He was recently seen on 08/13/15 by his cardiologist Dr. Claiborne Billings who  recommended increasing lasix to 80 mg for a few days and then increase his lasix 40 mg daily to BID. He was also started on entresto (was previously on ramipril) at that time. He has history of combined CHF and severe mitral regurgitation which he has declined repair in the past. His last 2D-echo on 10/16/13 revealed EF 55-60% with moderate AS and moderate to severe MR with severely calcified leaflets. He is on chronic coumadin for afib with subtherapeutic INR of 1.49 on admission. His last cardiac catherization in 2008 revealed 70% eccentric mid right artery stenosis, 30% LAD stenosis, 20-30% residual circumflex disease, and 50-60% AV groove blockage.   On EMS arrival he was hypoxic to 85% requiring 4L of oxygen via [place]. ABG on admission revealed pH 7.37, pO2 85, and pCO2 63.8. He is not on oxygen at home. Portable chest xray revealed moderate to large right pleural effusion with associated airspace disease. His last chest xray in 02/2015 revealed small bilateral pleural effusions. Lactic acid was mildly elevated at 2.14 with WBC of 14K. BNP was elevated at 1748.7 with prior 623.5 on 08/11/15 before entresto was started. His pacemaker is to be interrogated.   He is full code.   Hospital Course by problem list: Principal Problem:   Acute respiratory failure with hypoxia and hypercapnia (HCC) Active Problems:   Pacemaker - Medtronic Adapta 2014, leads 2008   CAD (coronary artery disease)   Mitral regurgitation, Severe   Prostate cancer (HCC)   HLD (hyperlipidemia)   HTN (hypertension)   A-fib (HCC)   Anticoagulated on warfarin   Combined systolic and diastolic heart failure (HCC)   Vitamin D deficiency   Acute pulmonary edema (HCC)   Pleural effusion   Goals of care, counseling/discussion   Pressure ulcer   Community acquired pneumonia   Hypoxia   Sepsis ([place])   Aortic stenosis   Pleural effusion, right   Acute respiratory failure with hypercapnia ([place])  1. Acute hypercapnic respiratory  failure due to severe mitral regurgitation - the patient was initially extremely somnolent, cardiology was consulted and agreed with aggressive IV diuresis and BiPAP for ABG-confirmed worsening hypercapnea. While he was initially thought to be full code by 'default', both he, his wife, and son all confirmed that he has been and still wishes a DNR/DNI status. He intermittently tolerated BiPAP only to refuse further BiPAP once his mentation did improve. Diuresis was held after patient was deemed euvolemic 2/2 hypotension, which resolved with gentle fluid boluses. Additionally, given his altered mentation, leukocytosis, and worsening respiratory status, as well as hypotension, infection was considered in his chronic right pleural effusion so he was started on piperacillin-tazobactam and linezolid, later to be transitioned to Unasyn. He declined therapeutic and diagnostic thoracentesis on multiple occasions. We continued to provide supportive care, and he continued to decline BiPAP as well. His somnolence gradually worsened, due to his hypercapneic respiratory failure, and comfort care was offered to him, with transfer to inpatient hospice coordinated.  Discharge Vitals:   BP 140/72 mmHg  Pulse 70  Temp(Src) 98.5 F (36.9 C) (Oral)  Resp 24  Ht 6\' 2"  (1.88 m)  Wt 169 lb 15.6 oz (77.1 kg)  BMI 21.81 kg/m2  SpO2 99%  Discharge Labs:  Results for orders placed or performed during the hospital encounter of 08/28/15 (from the past 24 hour(s))  Protime-INR     Status: Abnormal   Collection Time: 08/31/15  3:00 AM  Result Value Ref Range   Prothrombin Time 18.0 (H) 11.6 - 15.2 seconds   INR 1.48 0.00 - 1.49  Magnesium     Status: None   Collection Time: 08/31/15  3:00 AM  Result Value Ref Range   Magnesium 2.1 1.7 - 2.4 mg/dL  Basic metabolic panel     Status: Abnormal   Collection Time: 08/31/15  3:00 AM  Result Value Ref Range   Sodium 139 135 - 145 mmol/L   Potassium 4.7 3.5 - 5.1 mmol/L    Chloride 97 (L) 101 - 111 mmol/L   CO2 34 (H) 22 - 32 mmol/L   Glucose, Bld 124 (H) 65 - 99 mg/dL   BUN 30 (H) 6 - 20 mg/dL   Creatinine, Ser 1.34 (H) 0.61 - 1.24 mg/dL   Calcium 9.4 8.9 - 10.3 mg/dL   GFR calc non Af Amer 47 (L) >60 mL/min   GFR calc Af Amer 54 (L) >60 mL/min   Anion gap 8 5 - 15    Signed: [name], MD 08/31/2015, 11:49 AM   Services Ordered on Discharge: Hospice/palliative care Equipment Ordered on Discharge: None

## 2015-08-31 NOTE — Progress Notes (Signed)
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 At bedside this morning, he is more tachypneic, somnolent, and confused than he has been over the past 2 days.  Objective: Vital signs in last 24 hours: Filed Vitals:   08/31/15 0500 08/31/15 0600 08/31/15 0731 08/31/15 0800  BP:   140/72   Pulse: 70 72 73 70  Temp:   98.5 F (36.9 C)   TempSrc:   Oral   Resp: 30 28 38 36  Height:      Weight:      SpO2: 100% 100% 100% 100%   Weight change: 0 lb (0 kg)  Intake/Output Summary (Last 24 hours) at 08/31/15 0843 Last data filed at 08/31/15 0837  Gross per 24 hour  Intake    200 ml  Output    775 ml  Net   -575 ml     Gen: Chronically ill-appearing, alert and oriented to person, place, and time Neck: No JVD noted. CV: Normal rate, regular rhythm, no rubs, or gallops. There is a 2/6 systolic murmur heard best at the R 2nd ICS that radiates to the neck, as well as a 4/6 systolic murmur heard best at the apex that radiates to the axilla. Pulmonary: Normal effort, decreased breath sounds RLL with dullness to percussion, no crackles or wheezes. Abdominal: Soft, non-tender, non-distended, without rebound, guarding, or masses Extremities: Distal pulses 2+ in upper and lower extremities bilaterally, no tenderness, erythema or edema  Lab Results: Basic Metabolic Panel:  Recent Labs Lab 08/30/15 0300 08/31/15 0300  NA 139 139  K 4.4 4.7  CL 96* 97*  CO2 36* 34*  GLUCOSE 119* 124*  BUN 30* 30*  CREATININE 1.52* 1.34*  CALCIUM 8.9 9.4  MG 2.1 2.1   CBC:  Recent Labs Lab 08/29/15 0502 08/30/15 0300  WBC 17.7* 16.2*  NEUTROABS 15.6* 13.1*  HGB 13.3 12.8*  HCT 41.5 40.5  MCV 97.6 98.3  PLT 227 236   Coagulation:  Recent Labs Lab 08/28/15 0558 08/29/15 0502 08/30/15 0300 08/31/15 0300  LABPROT 18.1* 20.1* 20.0* 18.0*  INR 1.49 1.71* 1.70* 1.48   Assessment/Plan: 1. Acute hypoxic/hypercapnic  respiratory failure in setting of severe MR, combined CHF, large R pleural effusion - he has been improving with intermittent BiPAP, with repeat ABG showing slight improvement in hypercapnea. Soft BPs likely 2/2 diuresis and continuing Entresto, but with effusion, rising leukocytosis and low-grade isolated fever, there is concern for underlying PNA, although this is less likely the cause for his symptoms as his worsening cardiac disease and associated effusion. TTE shows preserved EF with severe MR. -Hold Entresto and IV diuresis temporarily in the setting of hypotension; did respond well to small fluid boluses -Linezolid, pip/tazo x 1. Switched to Unasyn 3/15 for ?PNA +/- parapneumonic effusion but patient declines thoracentesis at this time; will c/w abx and transition to augmentin -Consulted Dr. Lamonte Sakai with PCCM for possible Rt thoracentesis; pt declined at this time -Cardiology on board; appreciate thoughtful care of this mutual patient -Trend BMPs -F/u BCx, UCx - NGTD, showing 50K yeast in UCx -Initially SNF was thought to be an option, but patient likely needs inpatient hospice. Will reassess early this afternoon for any improvement, but the patient is likely too complex and requires too much care to be sent home.  2. Hematuria - does have h/o PrCA s/p cryoablation in 2011, with hematuria back in 02/2015 and rising PSA, so was started on Casodex by  his urologist. Most recent PSA last month was normal (1.1), so I question whether this is recurrent prostate cancer vs urethral trauma in the setting of anticoagulation. H and H are stable. -Continue to monitor clinically -Trend CBCs -Follow-up with his urologist outpatient   Dispo: Disposition is deferred at this time, awaiting improvement of current medical problems.   The patient does have a current PCP Cletis Athens, MD) and does need an [facility] hospital follow-up appointment after discharge.  The patient does have transportation limitations that  hinder transportation to clinic appointments.   LOS: 3 days   [name], MD 08/31/2015, 8:43 AM

## 2015-08-31 NOTE — Care Management Important Message (Signed)
Important Message  Patient Details  Name: Norman Lee MRN: [id number] Date of Birth: [birth year]   Medicare Important Message Given:  Yes    Neyah Ellerman P Antonie Borjon 08/31/2015, 2:55 PM

## 2015-08-31 NOTE — Progress Notes (Signed)
RT placed patient on BiPAP per request. RT also attempted ABG x1 but was unsuccessful. Patient requested BiPAP to be taken off. ABG will not be obtained at this time per RN. RT will continue to monitor.

## 2015-08-31 NOTE — Consult Note (Signed)
   [facility] Inpatient Consult   08/31/2015  SCOTT CHESSMAN 05-10-1929 [id number]   Patient screened for potential [facility] Care Management services. Chart reviewed. Noted patient is transitioning to comfort care. [facility] Care Management services not appropriate at this time.   Marthenia Rolling, MSN-Ed, RN,BSN [facility] Liaison [phone]

## 2015-08-31 NOTE — Progress Notes (Signed)
Patient refusing Bipap at this time. Spoke with patient's wife, as well as the patient to explain the benefits of wearing Bipap. Patient still refused and wife states "I'm not going to make him do anything he doesn't want to do." Will continue to monitor. Gildardo Cranker, RN

## 2015-08-31 NOTE — Progress Notes (Signed)
Patient has increased altered mental status. Patient stating he is "having another stroke." Upon assessment at this time, the patient does not appear to have any symptoms of a stroke. Paged MD on call. Dr. Charlynn Grimes and Dr. Redmond Pulling assessing patient at bedside. Ordered a STAT ABG and recommended starting patient back on BiPAP. Patient wearing Bipap at this time. Will continue to monitor. Gildardo Cranker, RN

## 2015-08-31 NOTE — Clinical Social Work Note (Addendum)
CSW received referral for patient needing residential hospice, CSW was informed by physician that patient's family would like [facility].  CSW contacted Hospice facility and gave referral, clinical information was faxed to hospice facility, awaiting response from [facility].  3:50 pm  CSW spoke to [facility] who said they have a bed available for patient tomorrow.  CSW updated case manager about discharge plan.  Jones Broom. [place], MSW, [place] 08/31/2015 12:35 PM

## 2015-09-02 LAB — CULTURE, BLOOD (ROUTINE X 2)
Culture: NO GROWTH
Culture: NO GROWTH

## 2015-09-16 NOTE — Progress Notes (Signed)
PT Cancellation Note  Patient Details Name: Norman Lee MRN: [id number] DOB: 1928-09-21   Cancelled Treatment:    Reason Eval/Treat Not Completed: Other (comment) (Pt comfort care only; d/c to residential hospice)   [place] April, PTA Pager: [phone]   09/15/15, 1:32 PM

## 2015-09-16 NOTE — Clinical Social Work Note (Signed)
CSW spoke with patient's son regarding patient going to [facility].  Patient's wife will be meeting with admissions at hospice house at [place] said they can take patient after 4pm.  CSW updated case manager, bedside nurse, physician, and patient's family.  CSW contacted [facility] to arrange EMS transport to hospice facility, gold DNR on patient's chart awaiting signature by physician.  Jones Broom. [place], MSW, [place] 09/16/2015 11:15 AM

## 2015-09-16 NOTE — Consult Note (Signed)
Reviewed chart and spoke with RN 2H.  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  Family has already requested that patient be referred to [facility] facility.  CSW has made referral and faxed documentation.  Introduced self to family at bedside.  No questions or concerns voiced.  Awaiting approval from hospice for transfer, hopefully today or tomorrow.  Patient appears comfortable in bed, not signs of acute distress.  No palliative needs at this point.  Will follow and assist with transition if necessary.  I don't see any issues with eligibility for hospice transfer.  Kizzie Fantasia, RN-BC, MSN, [facility] Palliative Care

## 2015-09-16 NOTE — Clinical Social Work Note (Signed)
Clinical Social Work Assessment  Patient Details  Name: Norman Lee MRN: [id number] Date of Birth: [birth year]  Date of referral:  09-30-2015               Reason for consult:  End of Life/Hospice                Permission sought to share information with:  Family Supports, Customer service manager Permission granted to share information::  Yes, Verbal Permission Granted  Name::     Rybolt,Christopher Son [phone]  Agency::  [facility]  Relationship::     Contact Information:     Housing/Transportation Living arrangements for the past 2 months:  Single Family Home Source of Information:  Adult Children Patient Interpreter Needed:  None Criminal Activity/Legal Involvement Pertinent to Current Situation/Hospitalization:  No - Comment as needed Significant Relationships:  Adult Children, Spouse Lives with:  Spouse Do you feel safe going back to the place where you live?  No (Patient will be going to residential hospice for end of life care) Need for family participation in patient care:   (Patient is unresponsive)  Care giving concerns:  Patient is end of life care and will be going to residential hospice   Social Worker assessment / plan:  Patient is currently unresponsive due to being comfort care.  CSW completed assessment by speaking with patient's son Gerald Stabs.  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  Patient did not have any family at bedside, CSW spoke to patient's son on the phone.  Patient's son expressed that he was very grateful for facilitation to [facility].  Patient's son did not have any other questions or concerns.   Employment status:  Retired Nurse, adult PT Recommendations:  [place] / Referral to community resources:     Patient/Family's Response to care:  Patient's family agreeable to going to residential  hospice.  Patient/Family's Understanding of and Emotional Response to Diagnosis, Current Treatment, and Prognosis:  Patient's family aware of current diagnosis and prognosis.  Emotional Assessment Appearance:  Appears stated age Attitude/Demeanor/Rapport:  Unable to Assess Affect (typically observed):  Unable to Assess Orientation:  Oriented to Self Alcohol / Substance use:  Not Applicable Psych involvement (Current and /or in the community):  No (Comment)  Discharge Needs  Concerns to be addressed:  No discharge needs identified Readmission within the last 30 days:  No Current discharge risk:  None Barriers to Discharge:  No Barriers Identified   Ross Ludwig, LCSWA 2015-09-30, 11:21 AM

## 2015-09-16 NOTE — Progress Notes (Signed)
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 He is awake but nonverbal and not interactive.   Objective: Vital signs in last 24 hours: Filed Vitals:   08/31/15 1800 08/31/15 1900 08/31/15 2052 September 30, 2015 0400  BP:   111/56   Pulse: 76 74 71 78  Temp:   98 F (36.7 C)   TempSrc:   Axillary   Resp: 34 33 19   Height:      Weight:      SpO2: 92% 95% 97% 95%   Weight change:   Intake/Output Summary (Last 24 hours) at September 30, 2015 [id number] Last data filed at 2015/09/30 0600  Gross per 24 hour  Intake     50 ml  Output    675 ml  Net   -625 ml     Gen: Chronically ill-appearing, alert and oriented to person, place, and time Neck: No JVD noted. CV: Normal rate, regular rhythm, no rubs, or gallops. There is a 2/6 systolic murmur heard best at the R 2nd ICS that radiates to the neck, as well as a 4/6 systolic murmur heard best at the apex that radiates to the axilla. Pulmonary: Normal effort, decreased breath sounds RLL with dullness to percussion, no crackles or wheezes. Abdominal: Soft, non-tender, non-distended, without rebound, guarding, or masses Extremities: Distal pulses 2+ in upper and lower extremities bilaterally, no tenderness, erythema or edema  Lab Results: Basic Metabolic Panel:  Recent Labs Lab 08/30/15 0300 08/31/15 0300  NA 139 139  K 4.4 4.7  CL 96* 97*  CO2 36* 34*  GLUCOSE 119* 124*  BUN 30* 30*  CREATININE 1.52* 1.34*  CALCIUM 8.9 9.4  MG 2.1 2.1   CBC:  Recent Labs Lab 08/29/15 0502 08/30/15 0300  WBC 17.7* 16.2*  NEUTROABS 15.6* 13.1*  HGB 13.3 12.8*  HCT 41.5 40.5  MCV 97.6 98.3  PLT 227 236   Coagulation:  Recent Labs Lab 08/28/15 0558 08/29/15 0502 08/30/15 0300 08/31/15 0300  LABPROT 18.1* 20.1* 20.0* 18.0*  INR 1.49 1.71* 1.70* 1.48   Assessment/Plan: 1. Acute hypoxic/hypercapnic respiratory failure in setting of severe MR, combined CHF, large R pleural effusion - he has been improving with  intermittent BiPAP, with repeat ABG showing slight improvement in hypercapnea. Soft BPs likely 2/2 diuresis and continuing Entresto, but with effusion, rising leukocytosis and low-grade isolated fever, there is concern for underlying PNA, although this is less likely the cause for his symptoms as his worsening cardiac disease and associated effusion. TTE shows preserved EF with severe MR. -Transitioning to comfort care -Hold Entresto and IV diuresis temporarily in the setting of hypotension; did respond well to small fluid boluses -Linezolid, pip/tazo x 1. Switched to Unasyn 3/15 for ?PNA +/- parapneumonic effusion but now dc'ed on 3/16 -Cardiology on board; appreciate thoughtful care of this mutual patient -Trend BMPs -Catheter changed as showing 50K yeast in UCx -Patient needs inpatient hospice - facilitate transfer to [facility]  2. Hematuria - does have h/o PrCA s/p cryoablation in 2011, with hematuria back in 02/2015 and rising PSA, so was started on Casodex by his urologist. Most recent PSA last month was normal (1.1), so I question whether this is recurrent prostate cancer vs urethral trauma in the setting of anticoagulation. H and H are stable. -Continue to monitor clinically -Trend CBCs -Follow-up with his urologist outpatient   Dispo: Disposition is deferred at this time, awaiting improvement of current medical problems.   The patient does have a current PCP (  Norman Athens, MD) and does need an [facility] hospital follow-up appointment after discharge.  The patient does have transportation limitations that hinder transportation to clinic appointments.   LOS: 4 days   [name], MD 2015/09/21, 8:33 AM

## 2015-09-16 NOTE — Progress Notes (Signed)
IVs left in for [facility]osp[facility].  Foley left in for Hospice Care.  Report called in to Hospice, given to RN.  Ambulance scheduled for 4pm, will notify Hospice if running late.  Notified paramedics and Hospice to please play [place] game, requested per his son.  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

## 2015-09-16 DEATH — deceased

## 2015-10-06 ENCOUNTER — Ambulatory Visit: Payer: Self-pay | Admitting: Cardiovascular Disease

## 2016-03-19 IMAGING — CT CT CHEST W/O CM
2 of 3 series · 11 of 36 positions shown, 13 images · non-contrast
Comparison: CT abdomen 03/07/2015

CLINICAL DATA: Pleural effusion

EXAM:
CT CHEST WITHOUT CONTRAST
TECHNIQUE: Multidetector CT imaging of the chest was performed following the
standard protocol without IV contrast.

[Series 201: chest without, idose (2) · axial · non-contrast · 0.78mm/px · z∈[+5,+305]mm · 8 of 72 slices shown, 10 images]
[im 6/72  mediastinal]
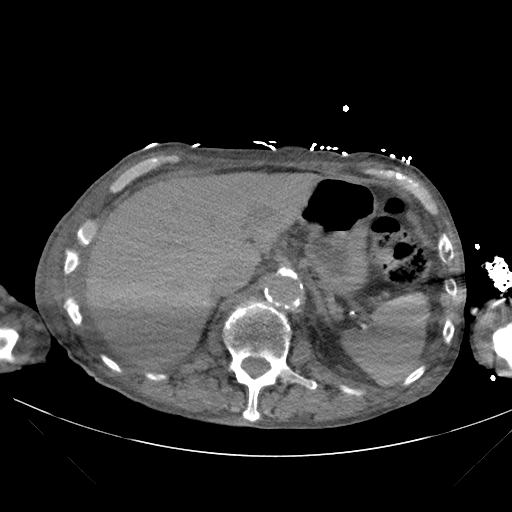
[im 6/72  lung]
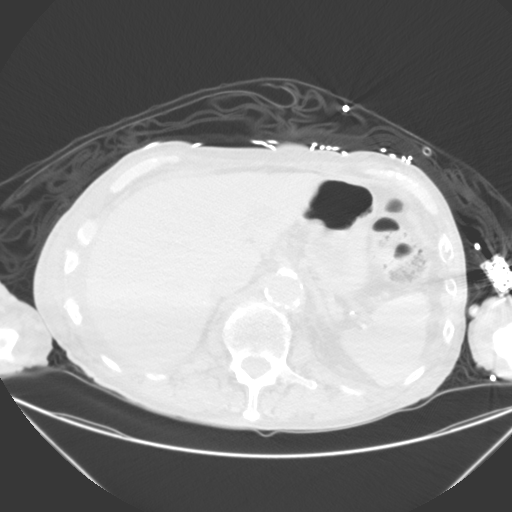
[im 14/72  lung]
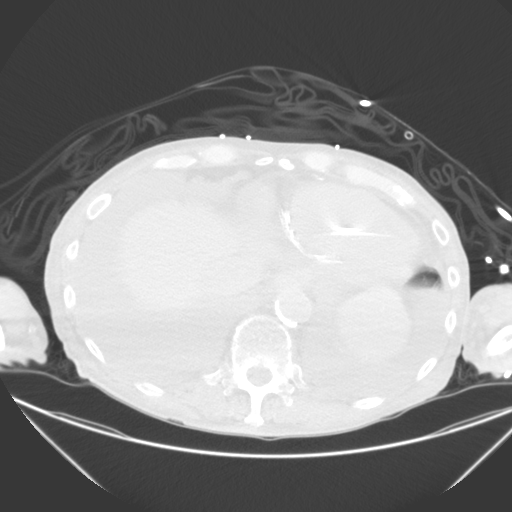
[im 24/72  lung]
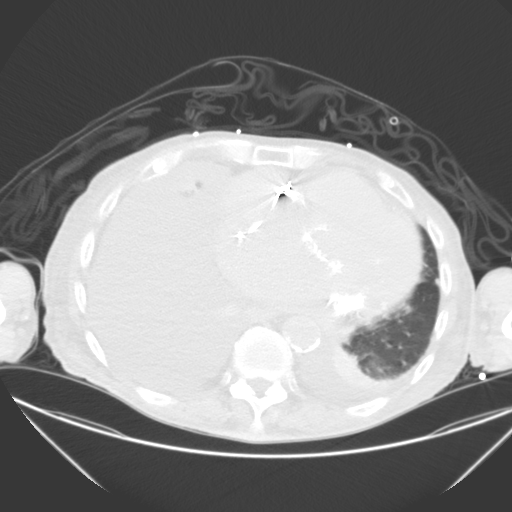
[im 32/72  lung]
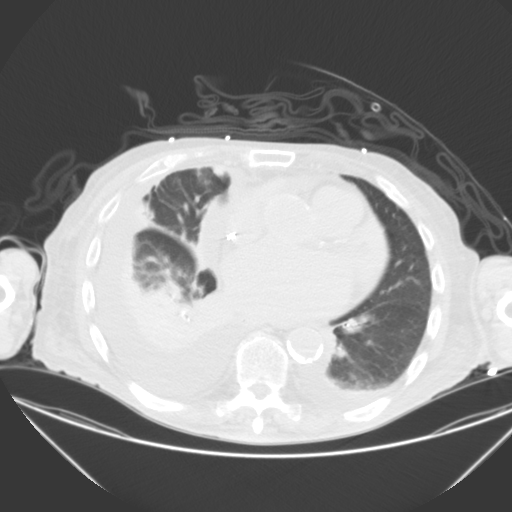
[im 40/72  mediastinal]
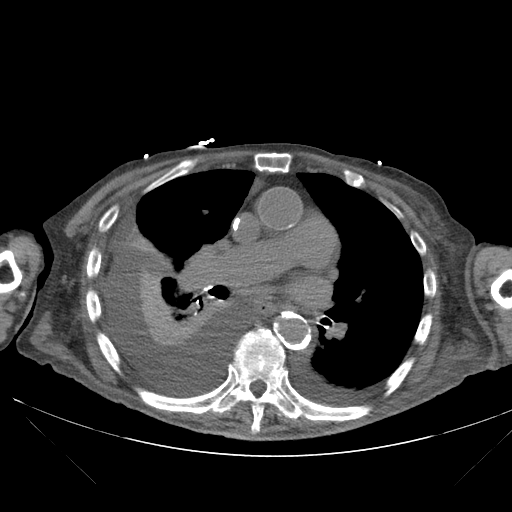
[im 40/72  lung]
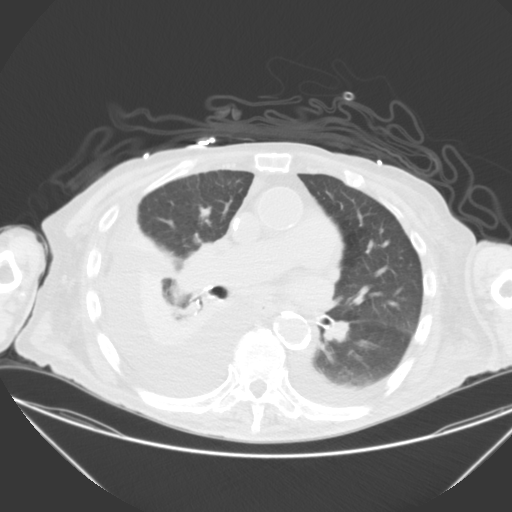
[im 48/72  lung]
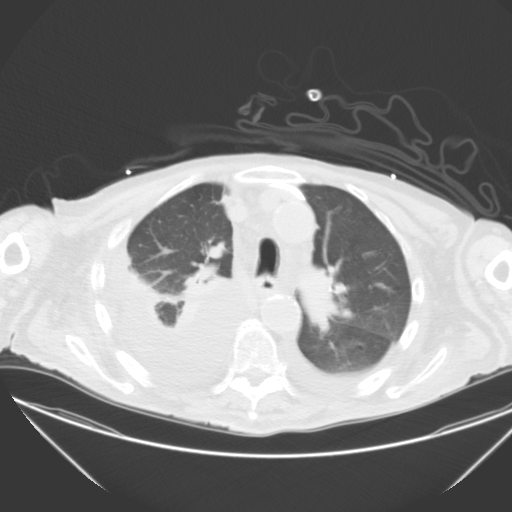
[im 58/72  lung]
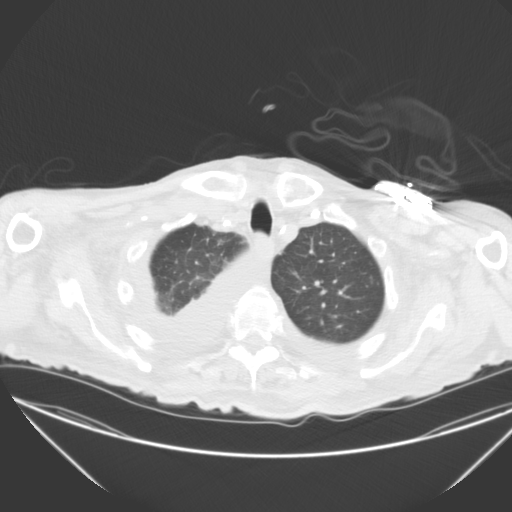
[im 66/72  lung]
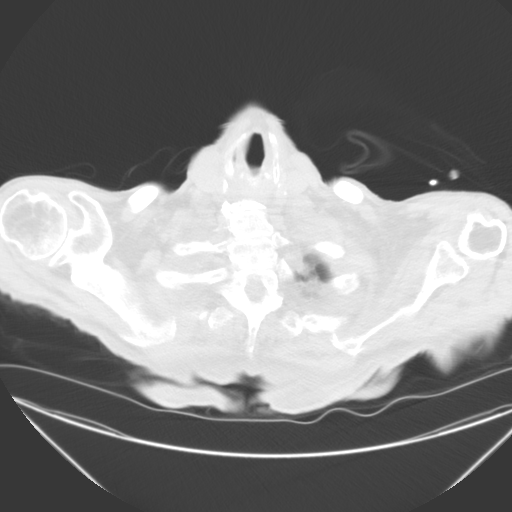

[Series 203: coronal, idose (2) · coronal · 0.45mm/px · 3 of 142 slices shown]
[im 29/142  lung]
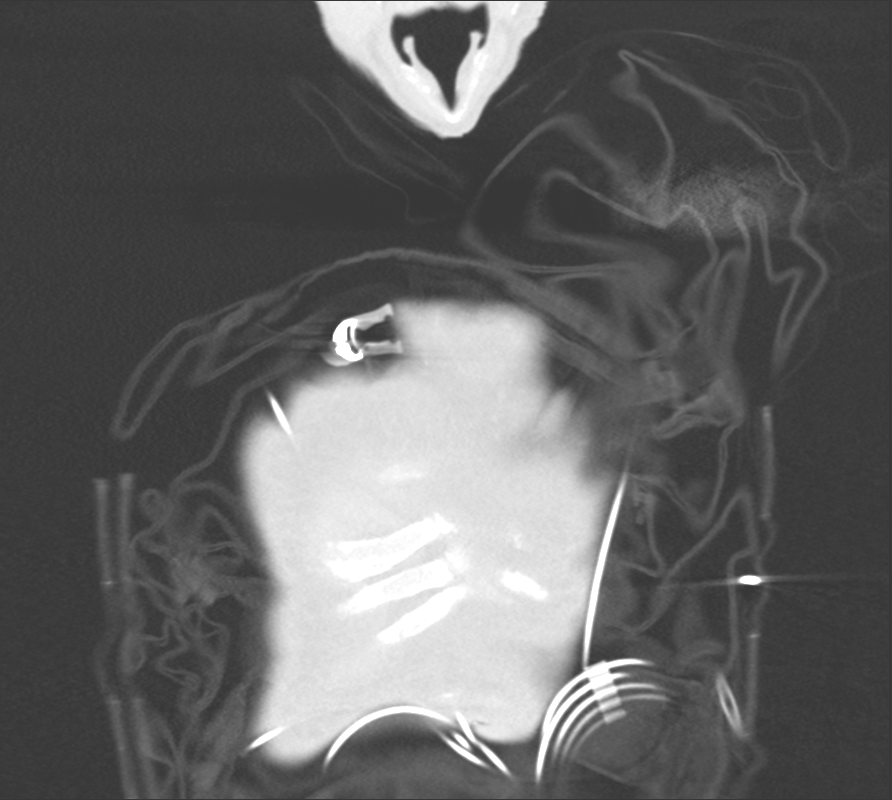
[im 57/142  lung]
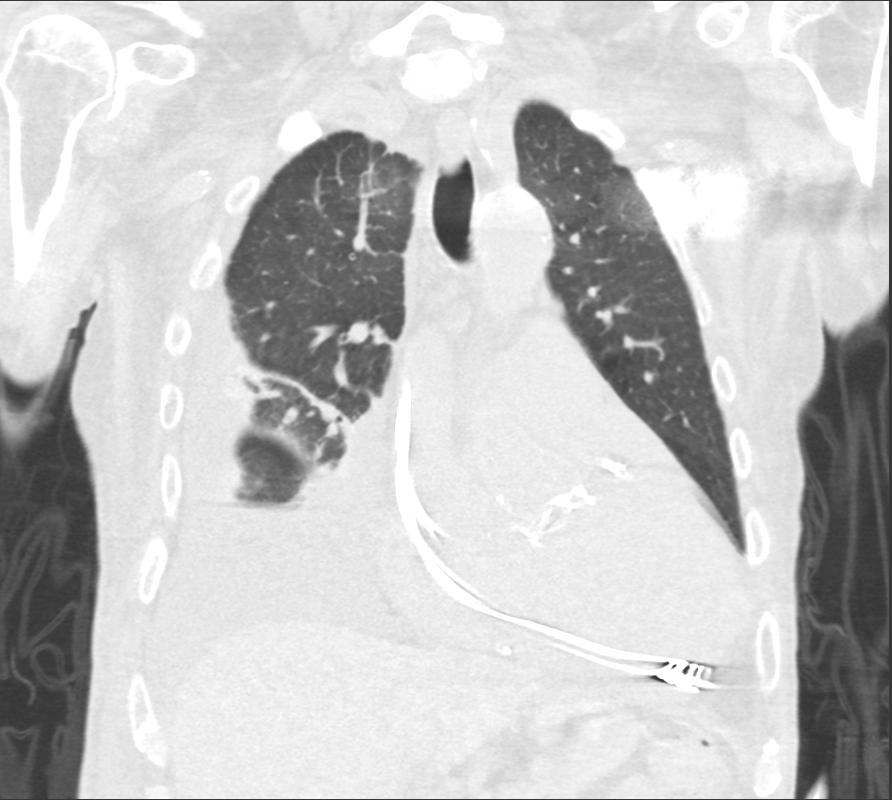
[im 85/142  lung]
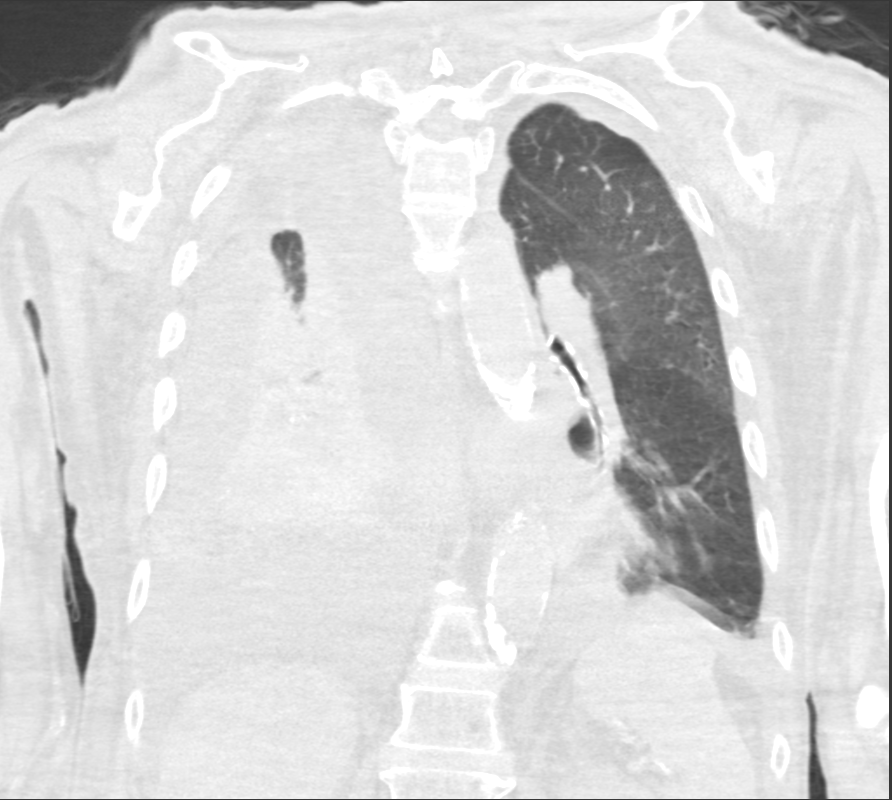

[11 of 36 positions shown; findings below may reference images not displayed]

FINDINGS: 1.5 cm precarinal lymph node. Small pericardial effusion surrounding
the lower great vessels. Smaller but mildly prominent AP window
nodes are noted.

Three vessel coronary artery calcification. Pacemaker device. Dense
mitral annular calcification.

Small left pleural effusion is larger. Moderate right pleural
effusion is larger. Pleural fluid extends anteriorly suggesting
loculation in complication. No pneumothorax

There is extensive confluent consolidation in the dependent right
lower lobe as well as the posterior right upper lobe. Underlying
malignancy within this area of consolidation cannot be excluded.
Density of the consolidation is somewhat heterogeneous with areas of
soft tissue density and areas of lower density. There is no obvious
endobronchial lesion

No destructive bone lesion. No definite acute fracture. Degenerative
changes in the lower cervical spine are noted.
IMPRESSION: Mild mediastinal adenopathy.

Complex right pleural effusion. Small left pleural effusion. Both
pleural effusions are larger.

Extensive consolidation in the right lung with heterogeneity.
Underlying malignancy within the area of consolidation cannot be
excluded. Bronchoscopy or PET-CT may be helpful.
